# Patient Record
Sex: Male | Born: 1960 | Race: Black or African American | Hispanic: No | Marital: Married | Smoking: Never smoker
Health system: Southern US, Community
[De-identification: ages and names within clinical notes are randomized; demographics above are authoritative.]

## PROBLEM LIST (undated history)

## (undated) ENCOUNTER — Emergency Department (HOSPITAL_COMMUNITY): Payer: Self-pay | Source: Home / Self Care

## (undated) DIAGNOSIS — I10 Essential (primary) hypertension: Secondary | ICD-10-CM

## (undated) DIAGNOSIS — R519 Headache, unspecified: Secondary | ICD-10-CM

## (undated) DIAGNOSIS — R0600 Dyspnea, unspecified: Secondary | ICD-10-CM

## (undated) DIAGNOSIS — M1990 Unspecified osteoarthritis, unspecified site: Secondary | ICD-10-CM

## (undated) DIAGNOSIS — I82409 Acute embolism and thrombosis of unspecified deep veins of unspecified lower extremity: Secondary | ICD-10-CM

## (undated) DIAGNOSIS — S0990XA Unspecified injury of head, initial encounter: Secondary | ICD-10-CM

## (undated) DIAGNOSIS — I2699 Other pulmonary embolism without acute cor pulmonale: Secondary | ICD-10-CM

## (undated) DIAGNOSIS — T148XXA Other injury of unspecified body region, initial encounter: Secondary | ICD-10-CM

## (undated) DIAGNOSIS — M2550 Pain in unspecified joint: Secondary | ICD-10-CM

## (undated) DIAGNOSIS — M109 Gout, unspecified: Secondary | ICD-10-CM

## (undated) DIAGNOSIS — R569 Unspecified convulsions: Secondary | ICD-10-CM

## (undated) DIAGNOSIS — F209 Schizophrenia, unspecified: Secondary | ICD-10-CM

## (undated) DIAGNOSIS — F329 Major depressive disorder, single episode, unspecified: Secondary | ICD-10-CM

## (undated) DIAGNOSIS — E785 Hyperlipidemia, unspecified: Secondary | ICD-10-CM

## (undated) DIAGNOSIS — F419 Anxiety disorder, unspecified: Secondary | ICD-10-CM

## (undated) DIAGNOSIS — F319 Bipolar disorder, unspecified: Secondary | ICD-10-CM

## (undated) DIAGNOSIS — D649 Anemia, unspecified: Secondary | ICD-10-CM

## (undated) DIAGNOSIS — R413 Other amnesia: Secondary | ICD-10-CM

## (undated) DIAGNOSIS — R351 Nocturia: Secondary | ICD-10-CM

## (undated) DIAGNOSIS — G4700 Insomnia, unspecified: Secondary | ICD-10-CM

## (undated) DIAGNOSIS — I4891 Unspecified atrial fibrillation: Secondary | ICD-10-CM

## (undated) DIAGNOSIS — I499 Cardiac arrhythmia, unspecified: Secondary | ICD-10-CM

## (undated) DIAGNOSIS — Z8719 Personal history of other diseases of the digestive system: Secondary | ICD-10-CM

## (undated) DIAGNOSIS — Z8619 Personal history of other infectious and parasitic diseases: Secondary | ICD-10-CM

## (undated) DIAGNOSIS — M2540 Effusion, unspecified joint: Secondary | ICD-10-CM

## (undated) DIAGNOSIS — F32A Depression, unspecified: Secondary | ICD-10-CM

## (undated) DIAGNOSIS — R51 Headache: Secondary | ICD-10-CM

## (undated) HISTORY — PX: HERNIA REPAIR: SHX51

## (undated) HISTORY — DX: Acute embolism and thrombosis of unspecified deep veins of unspecified lower extremity: I82.409

## (undated) HISTORY — DX: Hyperlipidemia, unspecified: E78.5

## (undated) HISTORY — DX: Essential (primary) hypertension: I10

## (undated) HISTORY — DX: Unspecified atrial fibrillation: I48.91

---

## 2008-06-04 DIAGNOSIS — Z8619 Personal history of other infectious and parasitic diseases: Secondary | ICD-10-CM

## 2008-06-04 HISTORY — DX: Personal history of other infectious and parasitic diseases: Z86.19

## 2014-06-04 DIAGNOSIS — I82409 Acute embolism and thrombosis of unspecified deep veins of unspecified lower extremity: Secondary | ICD-10-CM

## 2014-06-04 DIAGNOSIS — I2699 Other pulmonary embolism without acute cor pulmonale: Secondary | ICD-10-CM

## 2014-06-04 HISTORY — PX: RIGHT HEART CATH: SHX6075

## 2014-06-04 HISTORY — DX: Acute embolism and thrombosis of unspecified deep veins of unspecified lower extremity: I82.409

## 2014-06-04 HISTORY — DX: Other pulmonary embolism without acute cor pulmonale: I26.99

## 2014-06-04 HISTORY — PX: CARDIAC CATHETERIZATION: SHX172

## 2015-11-17 ENCOUNTER — Ambulatory Visit: Payer: Self-pay | Admitting: Internal Medicine

## 2015-11-18 ENCOUNTER — Encounter: Payer: Self-pay | Admitting: Cardiovascular Disease

## 2015-11-18 ENCOUNTER — Ambulatory Visit (INDEPENDENT_AMBULATORY_CARE_PROVIDER_SITE_OTHER): Payer: Medicaid Other | Admitting: Cardiovascular Disease

## 2015-11-18 VITALS — BP 121/91 | HR 69 | Ht 72.0 in | Wt 257.4 lb

## 2015-11-18 DIAGNOSIS — E785 Hyperlipidemia, unspecified: Secondary | ICD-10-CM | POA: Insufficient documentation

## 2015-11-18 DIAGNOSIS — I482 Chronic atrial fibrillation, unspecified: Secondary | ICD-10-CM | POA: Insufficient documentation

## 2015-11-18 DIAGNOSIS — I10 Essential (primary) hypertension: Secondary | ICD-10-CM | POA: Diagnosis not present

## 2015-11-18 DIAGNOSIS — I2699 Other pulmonary embolism without acute cor pulmonale: Secondary | ICD-10-CM | POA: Diagnosis not present

## 2015-11-18 DIAGNOSIS — I82403 Acute embolism and thrombosis of unspecified deep veins of lower extremity, bilateral: Secondary | ICD-10-CM | POA: Insufficient documentation

## 2015-11-18 NOTE — Assessment & Plan Note (Signed)
History of hyperlipidemia on statin therapy followed by his PCP 

## 2015-11-18 NOTE — Progress Notes (Signed)
11/18/2015 Garrett Ferrell   05/18/1961  [id number]  Primary Physician Jearld Leschwight M Williams, MD Primary Cardiologist: Runell GessJonathan J Ailie Gage MD Roseanne RenoFACP, FACC, FAHA, FSCAI  HPI:  [name] is a 55 year old mildly overweight married African-American male father of 5 children and is accompanied by his wife Garrett Ferrell today. He was referred by Dr. Mayford KnifeWilliams for cardiovascular evaluation and to be established in our practice. He has a history of chronic A. Fib dating back to 1999 on oral anticoagulation. Other problems included hypertension and hyperlipidemia. He has never had a heart attack or stroke. He probably had bilateral DVTs and a pulmonary embolus in October of last yeae living in [place] and was switched to Eliquis anticoagulation.   Current Outpatient Prescriptions  Medication Sig Dispense Refill  . ALPRAZolam (XANAX) 0.5 MG tablet Take 0.5 mg by mouth every 8 (eight) hours as needed. for anxiety  0  . atorvastatin (LIPITOR) 10 MG tablet Take 10 mg by mouth daily.    [name]. ELIQUIS 5 MG TABS tablet Take 5 mg by mouth 2 (two) times daily.  3  . metoprolol (LOPRESSOR) 50 MG tablet Take 50 mg by mouth 2 (two) times daily.  6  . trazodone (DESYREL) 300 MG tablet Take 300 mg by mouth at bedtime.  5  . zolpidem (AMBIEN) 10 MG tablet Take 10 mg by mouth at bedtime as needed for sleep.     No current facility-administered medications for this visit.    Not on File  Social History   Social History  . Marital Status: Married    Spouse Name: N/A  . Number of Children: N/A  . Years of Education: N/A   Occupational History  . Not on file.   Social History Main Topics  . Smoking status: Never Smoker   . Smokeless tobacco: Not on file  . Alcohol Use: Not on file  . Drug Use: Not on file  . Sexual Activity: Not on file   Other Topics Concern  . Not on file   Social History Narrative  . No narrative on file     Review of Systems: General: negative for chills, fever, night sweats or  weight changes.  Cardiovascular: negative for chest pain, dyspnea on exertion, edema, orthopnea, palpitations, paroxysmal nocturnal dyspnea or shortness of breath Dermatological: negative for rash Respiratory: negative for cough or wheezing Urologic: negative for hematuria Abdominal: negative for nausea, vomiting, diarrhea, bright red blood per rectum, melena, or hematemesis Neurologic: negative for visual changes, syncope, or dizziness All other systems reviewed and are otherwise negative except as noted above.    Blood pressure 121/91, pulse 69, height 6' (1.829 m), weight 257 lb 6.4 oz (116.756 kg).  General appearance: alert and no distress Neck: no adenopathy, no carotid bruit, no JVD, supple, symmetrical, trachea midline and thyroid not enlarged, symmetric, no tenderness/mass/nodules Lungs: clear to auscultation bilaterally Heart: irregularly irregular rhythm Extremities: extremities normal, atraumatic, no cyanosis or edema  EKG atrial fibrillation with ventricular response of 69 and nonspecific ST and T-wave changes.I personally reviewed this EKG  ASSESSMENT AND PLAN:   Atrial fibrillation (HCC) History of chronic atrial fibrillation on Eliquis  oral regulation rate controlled. He has had A. Fib since 1999.  Essential hypertension History of hypertension blood pressure measured at 121/91. He is on metoprolol. 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 He was on Prdaxa prior to that and was switched to Eliquis  He had a pulmonary embolus as well.      Runell Gess MD FACP,FACC,FAHA, [facility] 11/18/2015 9:33 AM

## 2015-11-18 NOTE — Assessment & Plan Note (Signed)
History of bilateral D[place] back in [place] in [place]. He was on Prdaxa prior to that and was switched to Eliquis  He had a pulmonary embolus as well.

## 2015-11-18 NOTE — Assessment & Plan Note (Signed)
History of hypertension blood pressure measured at 121/91. He is on metoprolol. Continue current meds at current dosing

## 2015-11-18 NOTE — Assessment & Plan Note (Signed)
History of chronic atrial fibrillation on Eliquis  oral regulation rate controlled. He has had A. Fib since 1999.

## 2015-11-18 NOTE — Patient Instructions (Signed)
Medication Instructions:  Your physician recommends that you continue on your current medications as directed. Please refer to the Current Medication list given to you today.   Labwork: none  Testing/Procedures: none  Follow-Up: Your physician wants you to follow-up in: 12 month with Dr. Allyson SabalBerry. You will receive a reminder letter in the mail two months in advance. If you don't receive a letter, please call our office to schedule the follow-up appointment.   Any Other Special Instructions Will Be Listed Below (If Applicable).     If you need a refill on your cardiac medications before your next appointment, please call your pharmacy.

## 2015-12-22 ENCOUNTER — Ambulatory Visit: Payer: Self-pay | Admitting: Internal Medicine

## 2016-01-03 ENCOUNTER — Encounter: Payer: Self-pay | Admitting: Family Medicine

## 2016-01-03 ENCOUNTER — Ambulatory Visit (INDEPENDENT_AMBULATORY_CARE_PROVIDER_SITE_OTHER): Payer: Medicaid Other | Admitting: Family Medicine

## 2016-01-03 DIAGNOSIS — M25552 Pain in left hip: Secondary | ICD-10-CM

## 2016-01-03 DIAGNOSIS — M25551 Pain in right hip: Secondary | ICD-10-CM | POA: Insufficient documentation

## 2016-01-03 NOTE — Assessment & Plan Note (Signed)
Exam is suggestive of an arthritic source to his pain. Doesn't appear to be originating from his back. Doesn't appear to be related to any musculature in nature. - Prescribed mobic - We will obtain x-rays of pelvis and hips bilaterally. - The results will dictate the direction of care. Could consider injections versus referral to orthopedics.

## 2016-01-03 NOTE — Progress Notes (Signed)
  Garrett Ferrell - 56 y.o. male MRN [id number]  Date of birth: 12-22-1960  SUBJECTIVE:  Including CC & ROS.  Chief Complaint  Patient presents with  . Hip Pain    Garrett Ferrell is a 55 year old male that is presenting with bilateral hip pain. This is ongoing pain for more than the past two years. 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 The pain is achy in nature. The pain is in the anterior aspect of his hip and radiates distally down the anterior aspect bilaterally. Reports the pain is worse after driving or sitting for a prolonged period of time. He spends most of the day lying in bed to help reduce his pain. He is not currently taking any antibiotics. He denies ever receiving any hip injections. He denies any prior imaging of his pelvis or hips.  ROS: No unexpected weight loss, fever, chills, swelling, instability, muscle pain, numbness/tingling, redness, otherwise see HPI    HISTORY: Past Medical, Surgical, Social, and Family History Reviewed & Updated per EMR.   Pertinent Historical Findings include: PMSHx -  Chronic afib (eliquis), HTN, partial fusion of right ankle, history of fractured L4,  PSHx -  alcohol on occasion, no tobacco, Currently on disability from skull fracture and blindness in right eye.  FHx -  Mother and father with heart problems.  Medications - allopurinol, lipitor, eliquis, lopressor, risperdal, seroquel,   DATA REVIEWED: None to review   PHYSICAL EXAM:  VS: BP:110/67  HR:64bpm  TEMP: ( )  RESP:   HT:6' (182.9 cm)   WT:246 lb (111.6 kg)  BMI:33.4 PHYSICAL EXAM: Gen: NAD, alert, cooperative with exam, well-healed scars on his anterior for head HEENT: clear conjunctiva,  CV:  no edema, capillary refill brisk,  Resp: non-labored Skin: no rashes, normal turgor  Neuro: no gross deficits.  Psych:  alert and oriented Hip: ROM Limited internal range of motion left hip 10 and right hip with 10-20. Normal hip flexion. Normal external rotation. Standing hip rotation and gait  without trendelenburg sign / unsteadiness. Greater trochanter without tenderness to palpation. Pain on the lateral aspect reproduced with a FAIR test FADIR reproduced pain in the groin. FABER with limited range of motion and some pain. Neurovascularly intact   ASSESSMENT & PLAN:   Bilateral hip pain Exam is suggestive of an arthritic source to his pain. Doesn't appear to be originating from his back. Doesn't appear to be related to any musculature in nature. - Prescribed mobic - We will obtain x-rays of pelvis and hips bilaterally. - The results will dictate the direction of care. Could consider injections versus referral to orthopedics.

## 2016-01-04 ENCOUNTER — Ambulatory Visit
Admission: RE | Admit: 2016-01-04 | Discharge: 2016-01-04 | Disposition: A | Discharge status: Home / Self Care | Payer: Medicaid Other | Source: Ambulatory Visit | Attending: Sports Medicine | Admitting: Sports Medicine

## 2016-01-04 ENCOUNTER — Other Ambulatory Visit: Payer: Self-pay | Admitting: Family Medicine

## 2016-01-04 DIAGNOSIS — M25552 Pain in left hip: Principal | ICD-10-CM

## 2016-01-04 DIAGNOSIS — M25551 Pain in right hip: Secondary | ICD-10-CM

## 2016-01-05 ENCOUNTER — Telehealth: Payer: Self-pay | Admitting: *Deleted

## 2016-01-05 NOTE — Telephone Encounter (Signed)
Patient called asking for xray results. Please call him to better explain the xray findings and next step

## 2016-01-09 ENCOUNTER — Other Ambulatory Visit: Payer: Self-pay | Admitting: *Deleted

## 2016-01-09 ENCOUNTER — Telehealth: Payer: Self-pay | Admitting: Family Medicine

## 2016-01-09 MED ORDER — MELOXICAM 15 MG PO TABS: 15.0000 mg | ORAL_TABLET | Freq: Every day | ORAL | 1 refills | Status: DC

## 2016-01-09 NOTE — Telephone Encounter (Signed)
Spoke with patient about his x-ray results. He will make an appointment for Tuesday to have an injection.   Myra RudeJeremy E Matty Deamer, MD PGY-4, [facility] 01/09/2016, 2:41 PM

## 2016-01-18 ENCOUNTER — Ambulatory Visit (INDEPENDENT_AMBULATORY_CARE_PROVIDER_SITE_OTHER): Payer: Medicaid Other | Admitting: Family Medicine

## 2016-01-18 ENCOUNTER — Encounter: Payer: Self-pay | Admitting: Family Medicine

## 2016-01-18 VITALS — BP 143/92 | Ht 72.0 in | Wt 250.0 lb

## 2016-01-18 DIAGNOSIS — M25552 Pain in left hip: Secondary | ICD-10-CM | POA: Diagnosis not present

## 2016-01-18 DIAGNOSIS — M25551 Pain in right hip: Secondary | ICD-10-CM

## 2016-01-18 MED ORDER — METHYLPREDNISOLONE ACETATE 80 MG/ML IJ SUSP
80.0000 mg | Freq: Once | INTRAMUSCULAR | Status: AC
  Administered 2016-01-18: 80 mg via INTRAMUSCULAR

## 2016-01-20 ENCOUNTER — Other Ambulatory Visit: Payer: Self-pay | Admitting: Sports Medicine

## 2016-01-20 ENCOUNTER — Other Ambulatory Visit: Payer: Self-pay | Admitting: Family Medicine

## 2016-01-20 DIAGNOSIS — M25551 Pain in right hip: Secondary | ICD-10-CM

## 2016-01-20 DIAGNOSIS — M25552 Pain in left hip: Principal | ICD-10-CM

## 2016-01-20 NOTE — Assessment & Plan Note (Addendum)
Unable to perform ultrasound guided hip injections. - Referral placed to [facility] for fluoro guided hip injections. - IM Depo-Medrol provided today.

## 2016-01-20 NOTE — Progress Notes (Signed)
  Garrett Ferrell - 55 y.o. male MRN [id number]  Date of birth: 03/05/1961  Patient following up from visit on 01/03/16 for bilateral hip pain. We're going to perform bilateral intra-articular hip injections today. These were unable to be performed today as we are not able to achieve the desired depth to view his femoral neck.  ASSESSMENT & PLAN:   Bilateral hip pain Unable to perform ultrasound guided hip injections. - Referral [facility] for fluoro guided hip injections. - IM Depo-Medrol provided today.

## 2016-01-24 ENCOUNTER — Ambulatory Visit (INDEPENDENT_AMBULATORY_CARE_PROVIDER_SITE_OTHER): Payer: Medicaid Other | Admitting: Family Medicine

## 2016-01-24 ENCOUNTER — Encounter: Payer: Self-pay | Admitting: Family Medicine

## 2016-01-24 VITALS — BP 123/89 | HR 80 | Ht 72.0 in | Wt 250.0 lb

## 2016-01-24 DIAGNOSIS — M25571 Pain in right ankle and joints of right foot: Secondary | ICD-10-CM | POA: Insufficient documentation

## 2016-01-24 NOTE — Assessment & Plan Note (Signed)
Pain most likely related to arthritic pain status post surgery. Has not had any injury recently to suggest ankle sprain or fracture - Refer to orthopedics  - May benefit from cushioned orthotics in the future

## 2016-01-24 NOTE — Progress Notes (Signed)
  Garrett CodeJerome Ferrell - 55 y.o. male MRN [id number]  Date of birth: 05/08/1961  SUBJECTIVE:  Including CC & ROS.  Chief Complaint  Patient presents with  . Ankle Pain     [name] is a 55 year old male is presenting with right ankle pain. He is also been recently evaluated bilateral hip pain. He is a former Stage managerprofessional wrestler. He reports having an ankle fracture in 1999 and subsequent surgery for it. He has had pain since that time and his right ankle. The pain is occurring on the anterior aspect of the medial and lateral side. The pain occurs with walking. He has had a previous injection some years ago with no improvement. He has tried a brace with no improvement. He is not performed any physical therapy to this point. His initial injury was an inversion injury. He has had limited improvement with mobic.  ROS: No unexpected weight loss, fever, chills, swelling, redness, otherwise see HPI    HISTORY: Past Medical, Surgical, Social, and Family History Reviewed & Updated per EMR.   Pertinent Historical Findings include: PMSHx -  Chronic afib (eliquis), HTN, partial fusion of right ankle   DATA REVIEWED: None to review   PHYSICAL EXAM:  VS: BP:123/89  HR:80bpm  TEMP: ( )  RESP:   HT:6' (182.9 cm)   WT:250 lb (113.4 kg)  BMI:34 PHYSICAL EXAM: Gen: NAD, alert, cooperative with exam HEENT: clear conjunctiva, EOMI CV:  no edema, capillary refill brisk,  Resp: non-labored, normal speech Skin: no rashes, normal turgor  Neuro: no gross deficits.  Psych:  alert and oriented Ankle & Foot: Well-healed scars on the anterior aspect of his right ankle. No overlying erythema or ecchymosis. No edema. Limited range of motion in all directions. Tenderness to palpation over the medial lateral ankle joint Normal arch. No tenderness to palpation over the metatarsals. Normal pulses Normal sensation Normal gait Negative anterior drawer Negative compression test  ASSESSMENT & PLAN:   Right ankle  pain Pain most likely related to arthritic pain status post surgery. Has not had any injury recently to suggest ankle sprain or fracture - Refer to orthopedics  - May benefit from cushioned orthotics in the future

## 2016-02-13 ENCOUNTER — Telehealth: Payer: Self-pay | Admitting: Cardiovascular Disease

## 2016-02-13 NOTE — Telephone Encounter (Signed)
Victorino DikeJennifer is calling because mr. Merkel has a purple knot on the back of his knee. He has had a blood clot before and she is not sure whether he needs to go to ED or needs to come in to see the doctor

## 2016-02-13 NOTE — Telephone Encounter (Signed)
Have him see a MLP to eval

## 2016-02-13 NOTE — Telephone Encounter (Signed)
Spoke to wife (ok per DPR).  Reports last night they noticed a knot to back of left knee, red/purple in color, warm to touch, +painful.  Pt hx DVT and PE on eliquis (reports taking as prescribed).  Denies injury to area.  Concerned for DVT given hx and similar s/sx per wife.    Advised I would route to MD for advice/recommendations.  Verbalized understanding.

## 2016-02-14 NOTE — Telephone Encounter (Signed)
Spoke to wife-reports she will have pt call to schedule appt.

## 2016-02-14 NOTE — Telephone Encounter (Signed)
Called patient-appt made for 9/13 at 3:30 with Norma FredricksonLori Gerhardt NP at [facility] location-address and location given.  Pt verbalized understanding.

## 2016-02-15 ENCOUNTER — Ambulatory Visit: Payer: Self-pay | Admitting: Nurse Practitioner

## 2016-02-15 ENCOUNTER — Encounter: Payer: Self-pay | Admitting: Nurse Practitioner

## 2016-02-15 NOTE — Progress Notes (Deleted)
CARDIOLOGY OFFICE NOTE  Date:  02/15/2016    [name] Date of Birth: 24-Apr-1961 Medical Record #[id number]  PCP:  Jearld Lesch, MD  Cardiologist:  Cheron Schaumann chief complaint on file.   History of Present Illness: Garrett Ferrell is a 55 y.o. male who presents today for a work in visit. Seen for Dr. Allyson Sabal.   He has a history of chronic A. Fib dating back to 1999 on oral anticoagulation. Other problems included hypertension and hyperlipidemia. He has never had a heart attack or stroke. He probably had bilateral DVTs and a pulmonary embolus in October of 2016 and was switched to Eliquis anticoagulation.  Noted to be on multiple psyche medicines.   Phone call earlier this week - "Spoke to wife (ok per DPR).  Reports last night they noticed a knot to back of left knee, red/purple in color, warm to touch, +painful.  Pt hx DVT and PE on eliquis (reports taking as prescribed).  Denies injury to area.  Concerned for DVT given hx and similar s/sx per wife."    Thus added to my schedule.     Comes in today. Here with   Past Medical History:  Diagnosis Date  . Atrial fibrillation (HCC)   . Blindness of right eye   . DVT (deep venous thrombosis) (HCC)   . Hip pain   . History of ankle fusion    right  . Hyperlipidemia   . Hypertension     No past surgical history on file.   Medications: Current Outpatient Prescriptions  Medication Sig Dispense Refill  . allopurinol (ZYLOPRIM) 100 MG tablet Take 100 mg by mouth daily.  5  . ALPRAZolam (XANAX) 0.5 MG tablet Take 0.5 mg by mouth every 8 (eight) hours as needed. for anxiety  0  . atorvastatin (LIPITOR) 10 MG tablet Take 10 mg by mouth daily.    [name] ELIQUIS 5 MG TABS tablet Take 5 mg by mouth 2 (two) times daily.  3  . meloxicam (MOBIC) 15 MG tablet Take 1 tablet (15 mg total) by mouth daily. 30 tablet 1  . metoprolol (LOPRESSOR) 50 MG tablet Take 50 mg by mouth 2 (two) times daily.  6  . risperiDONE (RISPERDAL) 1 MG tablet  Take 1 mg by mouth 2 (two) times daily.  5  . SEROQUEL XR 150 MG 24 hr tablet Take 150 mg by mouth at bedtime.  1  . trazodone (DESYREL) 300 MG tablet Take 300 mg by mouth at bedtime.  5  . VIAGRA 100 MG tablet Take 100 mg by mouth daily as needed.  2  . zolpidem (AMBIEN) 10 MG tablet Take 10 mg by mouth at bedtime as needed for sleep.     No current facility-administered medications for this visit.     Allergies: No Known Allergies  Social History: The patient  reports that he has never smoked. He has never used smokeless tobacco. He reports that he drinks alcohol. He reports that he does not use drugs.   Family History: The patient's family history includes Heart Problems in his father and mother.   Review of Systems: Please see the history of present illness.   Otherwise, the review of systems is positive for none.   All other systems are reviewed and negative.   Physical Exam: VS:  There were no vitals taken for this visit. [name]  BMI There is no height or weight on file to calculate BMI.  Wt Readings from  Last 3 Encounters:  01/24/16 250 lb (113.4 kg)  01/18/16 250 lb (113.4 kg)  01/03/16 246 lb (111.6 kg)    General: Pleasant. Well developed, well nourished and in no acute distress.   HEENT: Normal.  Neck: Supple, no JVD, carotid bruits, or masses noted.  Cardiac: Regular rate and rhythm. No murmurs, rubs, or gallops. No edema.  Respiratory:  Lungs are clear to auscultation bilaterally with normal work of breathing.  GI: Soft and nontender.  MS: No deformity or atrophy. Gait and ROM intact.  Skin: Warm and dry. Color is normal.  Neuro:  Strength and sensation are intact and no gross focal deficits noted.  Psych: Alert, appropriate and with normal affect.   LABORATORY DATA:  EKG:  EKG is not ordered today.  No results found for: WBC, HGB, HCT, PLT, GLUCOSE, CHOL, TRIG, HDL, LDLDIRECT, LDLCALC, ALT, AST, NA, K, CL, CREATININE, BUN, CO2, TSH, PSA, INR, GLUF, HGBA1C,  MICROALBUR  BNP (last 3 results) No results for input(s): BNP in the last 8760 hours.  ProBNP (last 3 results) No results for input(s): PROBNP in the last 8760 hours.   Other Studies Reviewed Today:   Assessment/Plan:  Atrial fibrillation (HCC) History of chronic atrial fibrillation on Eliquis  oral regulation rate controlled. He has had A. Fib since 1999.  Essential hypertension History of hypertension blood pressure measured at 121/91. He is on metoprolol. Continue current meds at current dosing  Hyperlipidemia History of hyperlipidemia on statin therapy followed by his PCP  DVT, bilateral lower limbs (HCC) History of bilateral D[place] back in [place] of last [place] in [place]. He was on Prdaxa prior to that and was switched to Eliquis  He had a pulmonary embolus as well.  Current medicines are reviewed with the patient today.  The patient does not have concerns regarding medicines other than what has been noted above.  The following changes have been made:  See above.  Labs/ tests ordered today include:   No orders of the defined types were placed in this encounter.    Disposition:   FU with Dr. Allyson SabalBerry as planned.   Patient is agreeable to this plan and will call if any problems develop in the interim.   Signed: Rosalio MacadamiaLori C. Tonye Tancredi, RN, ANP-C 02/15/2016 1:39 PM  [facility] [address] [place], [place]  [address] Phone: [phone]) [phone] Fax: [phone]

## 2016-02-20 ENCOUNTER — Encounter: Payer: Self-pay | Admitting: Nurse Practitioner

## 2016-03-05 ENCOUNTER — Telehealth: Payer: Self-pay | Admitting: Family Medicine

## 2016-03-05 NOTE — Telephone Encounter (Signed)
Left VM for patient. If he calls back please have him speak with a nurse/CMA and ask what he would like to do for pain or what he has tried in the past.   If any questions then please take the best time and phone number to call and I will try to call him back.   Myra RudeJeremy E Schmitz, MD PGY-4, [facility] 03/05/2016, 1:55 PM

## 2016-03-28 ENCOUNTER — Encounter: Payer: Self-pay | Admitting: Physician Assistant

## 2016-03-28 ENCOUNTER — Ambulatory Visit (INDEPENDENT_AMBULATORY_CARE_PROVIDER_SITE_OTHER): Payer: Medicaid Other | Admitting: Physician Assistant

## 2016-03-28 VITALS — BP 138/84 | HR 86 | Ht 72.0 in | Wt 260.8 lb

## 2016-03-28 DIAGNOSIS — Z01818 Encounter for other preprocedural examination: Secondary | ICD-10-CM

## 2016-03-28 DIAGNOSIS — I482 Chronic atrial fibrillation: Secondary | ICD-10-CM

## 2016-03-28 DIAGNOSIS — I4821 Permanent atrial fibrillation: Secondary | ICD-10-CM

## 2016-03-28 DIAGNOSIS — I2692 Saddle embolus of pulmonary artery without acute cor pulmonale: Secondary | ICD-10-CM | POA: Diagnosis not present

## 2016-03-28 DIAGNOSIS — I82432 Acute embolism and thrombosis of left popliteal vein: Secondary | ICD-10-CM

## 2016-03-28 NOTE — Progress Notes (Signed)
Cardiology Office Note    Date:  03/28/2016   ID:  [name], DOB [birth year], MRN [id number]  PCP:  Jearld Lesch, MD  Cardiologist:  Dr. Allyson Sabal  Referred by Dr. Lajoyce Corners with [facility] Reason for referral: preop clearance prior to ankle surgery   Chief Complaint  Patient presents with  . Follow-up    seen for Dr. Allyson Sabal, preop clearance request by Dr. [facility], plan for shoulder surgery.     History of Present Illness:  Garrett Ferrell is a 55 y.o. 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 He was recently establish with [place] on 11/18/2015. He never had heart attack or stroke himself, however has significant family history of CAD with his father died of MI in his 22s. His mother has a pacemaker. He says he was on Pradaxa before, and transitioned to eliquis. He was on a long trip in 2016, when he forgot to take his eliquis for 3 days, he ended up in the hospital with DVT and PE. Based on outside records from [facility] in [facility] [place], he had a left popliteal DVT at the time, V/Q skin read as high likelihood of PE, CTA confirmed extensive bilateral pulmonary emboli. He eventually underwent right heart catheterization with EKOS therapy on 03/24/2015 for saddle emboli. Left coronary was not injected. Otherwise he has been doing well on current medication. His EKG does show T wave inversion in the anterior lead, however he denies chest pain at rest. He does not exert himself.   Otherwise he is doing okay, he denies any lower extremity edema, orthopnea or paroxysmal nocturnal dyspnea. His right ankle has been bothering him for several years after previous injury, he was recently seen by Dr. Lajoyce Corners who recommended surgery. Given the abnormal EKG and a significant family history, we decided to obtain outpatient Lexiscan stress test.  If low risk, he is cleared to proceed with surgery. I have personally checked with our clinical pharmacist regarding eliquis. Due to similarity of metabolism of eliquis versus Lovenox, no bridging was felt to be necessary. However given the fact that he had both chronic atrial fibrillation and PE/DVT, his risk of thromboembolic event is quite high, we would recommend to stop his eliquis for 24 hours prior to surgery (2 doses) if bleeding risk of surgery is relatively low. Per current guidelines, eliquis should be held for at least 48 hours before any major surgery that cause significant bleeding, however may hold for 24 hours in people without significant chronic bleeding problem.   Past Medical History:  Diagnosis Date  . Atrial fibrillation (HCC)   . Blindness of right eye   . DVT (deep venous thrombosis) (HCC)   . Hip pain   . History of ankle fusion    right  . Hyperlipidemia   . Hypertension     Past Surgical History:  Procedure Laterality Date  . RIGHT HEART CATH  2016   at [facility] during his DVT/PE    Current Medications: Outpatient Medications Prior to Visit  Medication Sig Dispense Refill  . allopurinol (ZYLOPRIM) 100 MG tablet Take 100 mg by mouth daily.  5  . ALPRAZolam (XANAX) 0.5 MG tablet Take 0.5 mg by mouth every 8 (eight) hours as needed. for anxiety  0  . atorvastatin (LIPITOR) 10 MG tablet Take 10 mg by mouth daily.    [name] ELIQUIS  5 MG TABS tablet Take 5 mg by mouth 2 (two) times daily.  3  . meloxicam (MOBIC) 15 MG tablet Take 1 tablet (15 mg total) by mouth daily. 30 tablet 1  . metoprolol (LOPRESSOR) 50 MG tablet Take 50 mg by mouth 2 (two) times daily.  6  . risperiDONE (RISPERDAL) 1 MG tablet Take 1 mg by mouth 2 (two) times daily.  5  . SEROQUEL XR 150 MG 24 hr tablet Take 150 mg by mouth at bedtime.  1  . trazodone (DESYREL) 300 MG tablet Take 300 mg by mouth at bedtime.  5  . VIAGRA 100 MG tablet Take 100 mg by mouth daily as  needed.  2  . zolpidem (AMBIEN) 10 MG tablet Take 10 mg by mouth at bedtime as needed for sleep.     No facility-administered medications prior to visit.      Allergies:   Review of patient's allergies indicates no known allergies.   Social History   Social History  . Marital status: Married    Spouse name: Victorino DikeJennifer  . Number of children: 5  . Years of education: N/A   Occupational History  . disabled    Social History Main Topics  . Smoking status: Never Smoker  . Smokeless tobacco: Never Used  . Alcohol use 0.0 oz/week     Comment: rarely  . Drug use: No  . Sexual activity: Not Asked   Other Topics Concern  . None   Social History Narrative  . None     Family History:  The patient's family history includes Heart Problems in his mother; Heart attack in his father.   ROS:   Please see the history of present illness.    ROS All other systems reviewed and are negative.   PHYSICAL EXAM:   VS:  BP 138/84   Pulse 86   Ht 6' (1.829 m)   Wt 260 lb 12.8 oz (118.3 kg)   BMI 35.37 kg/m    GEN: Well nourished, well developed, in no acute distress  HEENT: normal  Neck: no JVD, carotid bruits, or masses Cardiac: irregularly irregular; no murmurs, rubs, or gallops,no edema  Respiratory:  clear to auscultation bilaterally, normal work of breathing GI: soft, nontender, nondistended, + BS MS: no deformity or atrophy  Skin: warm and dry, no rash Neuro:  Alert and Oriented x 3, Strength and sensation are intact Psych: euthymic mood, full affect  Wt Readings from Last 3 Encounters:  03/28/16 260 lb 12.8 oz (118.3 kg)  01/24/16 250 lb (113.4 kg)  01/18/16 250 lb (113.4 kg)      Studies/Labs Reviewed:   EKG:  EKG is ordered today.  The ekg ordered today demonstrates Normal sinus rhythm with T-wave inversion interiorly leads, occasional PVCs  Recent Labs: No results found for requested labs within last 8760 hours.   Lipid Panel No results found for: CHOL, TRIG, HDL,  CHOLHDL, VLDL, LDLCALC, LDLDIRECT  Additional studies/ records that were reviewed today include:   Outside report from [facility] in [place], previous right heart cath report.     ASSESSMENT:    1. Pre-op examination   2. Permanent atrial fibrillation (HCC)   3. Deep vein thrombosis (DVT) of popliteal vein of left lower extremity, unspecified chronicity (HCC)   4. 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 Preoperative clearance  - He has upcoming right ankle surgery.  He had a stress test several years ago, he has not been exerting himself, recent EKG does show that he has T-wave inversion in the anterior leads.  - We will obtain outpatient Lexiscan stress test prior to clearance.  - I have discussed up with our clinical pharmacist regarding the duration of eliquis, since the half life of eliquis is very similar to Lovenox, no bridging was felt to be necessary. For low risk bleeding surgery, we recommend hold the eliquis for 24 hours prior to the surgery. For high risk bleeding surgery, we recommend hold eliquis for 48 hours. In this patient will has upcoming ankle surgery, who had permanent atrial fibrillation and history of saddle emboli, we would recommend hold eliquis for 24 hours prior to surgery unless surgeon felt otherwise. He should be restarted on eliquis as soon as possible after surgery as his risk of developing DVT is quite high.  2. Permanent atrial fibrillation on eliquis  - First diagnosed in 1999, permanent by this point. He has been compliant with eliquis. EKG continued to show atrial fibrillation, however rate controlled with heart rate of 86.  - This patients CHA2DS2-VASc Score and unadjusted Ischemic Stroke Rate (% per year) is equal to 0.6 % stroke rate/year from a score of 1  Above score calculated as 1 point each if present [CHF, HTN, DM, Vascular=MI/PAD/Aortic Plaque, Age if 65-74,  or Male] Above score calculated as 2 points each if present [Age > 75, or Stroke/TIA/TE   3. H/o DVT/PE: had RHC with EKOS for saddle emboli in October 2016. He never had DVT and PE prior to that point, he attributed to the cause as coming off of eliquis for 3 days and had a prolonged flight where he was sedentary.  4. HTN: Blood pressure is 138/84. He is on metoprolol 50 mg twice a day for rate control. Last blood pressure in the office in August 2017 was within normal limits.    Medication Adjustments/Labs and Tests Ordered: Current medicines are reviewed at length with the patient today.  Concerns regarding medicines are outlined above.  Medication changes, Labs and Tests ordered today are listed in the Patient Instructions below. Patient Instructions  Medication Instructions:  Continue current medications  Labwork: None Ordered  Testing/Procedures: Your physician has requested that you have a lexiscan myoview. For further information please visit [email or web address] Please follow instruction sheet, as given.   Follow-Up: Your physician recommends that you schedule a follow-up appointment in: 4-6 weeks with Lisabeth Devoid or Dr Allyson Sabal   Any Other Special Instructions Will Be Listed Below (If Applicable).   If you need a refill on your cardiac medications before your next appointment, please call your pharmacy.      Ramond Dial, [place]  03/28/2016 3:45 PM    [facility] [address] [place], [place], [place]  [address] Phone: [phone]; Fax: [phone]

## 2016-03-28 NOTE — Patient Instructions (Signed)
Medication Instructions:  Continue current medications  Labwork: None Ordered  Testing/Procedures: Your physician has requested that you have a lexiscan myoview. For further information please visit [email or web address] Please follow instruction sheet, as given.   Follow-Up: Your physician recommends that you schedule a follow-up appointment in: 4-6 weeks with Lisabeth DevoidMeng or Dr Allyson SabalBerry   Any Other Special Instructions Will Be Listed Below (If Applicable).   If you need a refill on your cardiac medications before your next appointment, please call your pharmacy.

## 2016-04-03 ENCOUNTER — Telehealth (HOSPITAL_COMMUNITY): Payer: Self-pay

## 2016-04-03 NOTE — Telephone Encounter (Signed)
Encounter complete. 

## 2016-04-05 ENCOUNTER — Ambulatory Visit (HOSPITAL_COMMUNITY)
Admission: RE | Admit: 2016-04-05 | Discharge: 2016-04-05 | Disposition: A | Discharge status: Home / Self Care | Payer: Medicaid Other | Source: Ambulatory Visit | Attending: Cardiology | Admitting: Cardiology

## 2016-04-05 DIAGNOSIS — Z01818 Encounter for other preprocedural examination: Secondary | ICD-10-CM

## 2016-04-05 DIAGNOSIS — I4891 Unspecified atrial fibrillation: Secondary | ICD-10-CM | POA: Diagnosis not present

## 2016-04-05 LAB — MYOCARDIAL PERFUSION IMAGING
CHL CUP RESTING HR STRESS: 69 {beats}/min
CSEPPHR: 75 {beats}/min
LV sys vol: 1 mL
SDS: 1
SRS: 3
SSS: 4
TID: 1.16

## 2016-04-05 MED ORDER — TECHNETIUM TC 99M TETROFOSMIN IV KIT
29.4000 | PACK | Freq: Once | INTRAVENOUS | Status: AC | PRN
  Administered 2016-04-05: 29.4 via INTRAVENOUS
  Filled 2016-04-05: qty 30

## 2016-04-05 MED ORDER — REGADENOSON 0.4 MG/5ML IV SOLN
0.4000 mg | Freq: Once | INTRAVENOUS | Status: AC
  Administered 2016-04-05: 0.4 mg via INTRAVENOUS

## 2016-04-05 MED ORDER — TECHNETIUM TC 99M TETROFOSMIN IV KIT
10.2000 | PACK | Freq: Once | INTRAVENOUS | Status: AC | PRN
  Administered 2016-04-05: 10.2 via INTRAVENOUS
  Filled 2016-04-05: qty 11

## 2016-04-06 ENCOUNTER — Encounter: Payer: Self-pay | Admitting: Physician Assistant

## 2016-04-09 ENCOUNTER — Ambulatory Visit (INDEPENDENT_AMBULATORY_CARE_PROVIDER_SITE_OTHER): Payer: Medicaid Other

## 2016-04-09 ENCOUNTER — Telehealth: Payer: Self-pay | Admitting: Cardiovascular Disease

## 2016-04-09 ENCOUNTER — Ambulatory Visit (INDEPENDENT_AMBULATORY_CARE_PROVIDER_SITE_OTHER): Payer: Medicaid Other | Admitting: Orthopedic Surgery

## 2016-04-09 ENCOUNTER — Encounter (INDEPENDENT_AMBULATORY_CARE_PROVIDER_SITE_OTHER): Payer: Self-pay | Admitting: Orthopedic Surgery

## 2016-04-09 DIAGNOSIS — M19072 Primary osteoarthritis, left ankle and foot: Secondary | ICD-10-CM

## 2016-04-09 DIAGNOSIS — M19071 Primary osteoarthritis, right ankle and foot: Secondary | ICD-10-CM | POA: Diagnosis not present

## 2016-04-09 DIAGNOSIS — M25571 Pain in right ankle and joints of right foot: Secondary | ICD-10-CM

## 2016-04-09 NOTE — Telephone Encounter (Signed)
New message     Patient wife calling for nuclear test results.

## 2016-04-09 NOTE — Progress Notes (Signed)
Office Visit Note   Patient: Garrett Ferrell           Date of Birth: 06/12/1960           MRN: [id number] Visit Date: 04/09/2016              Requested by: Jearld Leschwight M Williams, MD [address] [place], [place] [address] PCP: Jearld Leschwight M Williams, MD   Assessment & Plan: Visit Diagnoses:  1. Osteoarthritis of both subtalar joints   2. Pain in right ankle and joints of right foot   Patient has just received cardiac clearance and we will schedule him for surgery at this time.  Plan: Plan to follow-up in the office 2 weeks postoperatively. Plan for posterior arthroscopic subtalar arthrodesis. After this has fused will evaluate the ankle joint. Patient may be able to continue with activities with the current arthritis in his ankle or may require total ankle arthroplasty.  Follow-Up Instructions: No Follow-up on file.   Orders:  Orders Placed This Encounter  Procedures  . XR Ankle Complete Right   No orders of the defined types were placed in this encounter.     Procedures: No procedures performed   Clinical Data: No additional findings.   Subjective: Chief Complaint  Patient presents with  . Right Ankle - Pain, Injury    Patient presents right ankle injury. He has fallen off porch approximately one week ago, he is concerned about possible re-injury. He is not currently taking any pain medication. He was also pending surgical intervention severe traumatic arthritis right ankle and subtalar joint. He obtained cardiac clearance this past Friday for surgery. He states his pain is progressively worsening. He is unable to walk due to pain at times.    Review of Systems   Objective: Vital Signs: There were no vitals taken for this visit.  Physical Exam  Ortho Exam on examination patient is alert oriented no adenopathy well-dressed normal affect normal strength he does have an antalgic gait he has pain with any attempted range of motion of the ankle and subtalar joint on the  right.  Specialty Comments:  No specialty comments available.  Imaging: Xr Ankle Complete Right  Result Date: 04/09/2016 Repeat radiographs of the right hindfoot shows traumatic arthritis of the ankle and subtalar joint beginning features no acute fractures.    PMFS History: Patient Active Problem List   Diagnosis Date Noted  . Right ankle pain 01/24/2016  . Bilateral hip pain 01/03/2016  . Atrial fibrillation (HCC) 11/18/2015  . Essential hypertension 11/18/2015  . Hyperlipidemia 11/18/2015  . DVT, bilateral lower limbs (HCC) 11/18/2015  . Pulmonary embolus (HCC) 11/18/2015   Past Medical History:  Diagnosis Date  . Atrial fibrillation (HCC)   . Blindness of right eye   . DVT (deep venous thrombosis) (HCC)   . Hip pain   . History of ankle fusion    right  . Hyperlipidemia   . Hypertension     Family History  Problem Relation Age of Onset  . Heart Problems Mother   . Heart attack Father     age 3340s    Past Surgical History:  Procedure Laterality Date  . RIGHT HEART CATH  2016   [facility] his D[facility]/PE   Social History   Occupational History  . disabled    Social History Main Topics  . Smoking status: Never Smoker  . Smokeless tobacco: Never Used  . Alcohol use 0.0 oz/week  Comment: rarely  . Drug use: No  . Sexual activity: Not on file

## 2016-04-09 NOTE — Progress Notes (Deleted)
   Office Visit Note   Patient: Garrett Ferrell           Date of Birth: 01/11/1961           MRN: [id number] Visit Date: 04/09/2016              Requested by: Jearld Leschwight M Williams, MD [address] [place], [place] [address] PCP: Jearld Leschwight M Williams, MD   Assessment & Plan: Visit Diagnoses: No diagnosis found.  Plan: ***  Follow-Up Instructions: No Follow-up on file.   Orders:  No orders of the defined types were placed in this encounter.  No orders of the defined types were placed in this encounter.     Procedures: No procedures performed   Clinical Data: No additional findings.   Subjective: Chief Complaint  Patient presents with  . Left Leg - Follow-up    Left below the knee amputation  . Right Leg - Follow-up    Right below the knee amputation    HPI  Review of Systems   Objective: Vital Signs: There were no vitals taken for this visit.  Physical Exam  Ortho Exam  Specialty Comments:  No specialty comments available.  Imaging: No results found.   PMFS History: Patient Active Problem List   Diagnosis Date Noted  . Right ankle pain 01/24/2016  . Bilateral hip pain 01/03/2016  . Atrial fibrillation (HCC) 11/18/2015  . Essential hypertension 11/18/2015  . Hyperlipidemia 11/18/2015  . DVT, bilateral lower limbs (HCC) 11/18/2015  . Pulmonary embolus (HCC) 11/18/2015   Past Medical History:  Diagnosis Date  . Atrial fibrillation (HCC)   . Blindness of right eye   . DVT (deep venous thrombosis) (HCC)   . Hip pain   . History of ankle fusion    right  . Hyperlipidemia   . Hypertension     Family History  Problem Relation Age of Onset  . Heart Problems Mother   . Heart attack Father     age 6740s    Past Surgical History:  Procedure Laterality Date  . RIGHT HEART CATH  2016   [facility] his [facility]   Social History   Occupational History  . disabled    Social History Main Topics  . Smoking status:  Never Smoker  . Smokeless tobacco: Never Used  . Alcohol use 0.0 oz/week     Comment: rarely  . Drug use: No  . Sexual activity: Not on file

## 2016-04-09 NOTE — Telephone Encounter (Signed)
Spoke w wife regarding results. Results given, as previously discussed with patient. She notes pt has hx of TBI and often has difficulty recalling information given. She is DPR and recommendations were discussed in detail, including med hold instruction for upcoming procedure. He also has a planned dental procedure which involves a filling and cleaning. She states dentist office needs clearance but will get in contact with us. Informed her I can give guidance to dentist office concerning our patient protocols. She is aware if further questions or concerns, to call.

## 2016-04-11 ENCOUNTER — Telehealth: Payer: Self-pay | Admitting: Cardiovascular Disease

## 2016-04-11 NOTE — Telephone Encounter (Signed)
Recommendations faxed to provider.

## 2016-04-11 NOTE — Telephone Encounter (Signed)
New message      1. What dental office are you calling from? [facility] 2. What is your office phone and fax number?  Fax [phone]--need something in writing for patient's chart  What type of procedure is the patient having performed? Deep cleaning What date is procedure scheduled? Pending clearance What is your question (ex. Antibiotics prior to procedure, holding medication-we need to know how long dentist wants pt to hold med)? Hold eliquis prior to deep cleaning?

## 2016-04-11 NOTE — Telephone Encounter (Signed)
No need for SBE prophylaxis. No need to stop Eliquis unless dentis requires this. Given his pulmonary emboli in the past. He would need Lovenox bridging if oral anticoagulation was discontinued.

## 2016-04-11 NOTE — Telephone Encounter (Signed)
Based on cardiac history, should not require endocarditis prophylaxis. Deep cleaning should not require Eliquis interruption. Dentist's office requesting something in writing. Routed to Dr. Allyson SabalBerry for note.

## 2016-04-17 ENCOUNTER — Other Ambulatory Visit (INDEPENDENT_AMBULATORY_CARE_PROVIDER_SITE_OTHER): Payer: Self-pay | Admitting: Family

## 2016-04-17 ENCOUNTER — Telehealth: Payer: Self-pay | Admitting: Cardiovascular Disease

## 2016-04-17 NOTE — Telephone Encounter (Signed)
Wife is calling asking if you would refax what you faxed on 04-11-16 please.

## 2016-04-17 NOTE — Telephone Encounter (Signed)
Fax resent and wife aware. She also had questions regarding patient's upcoming ortho procedure. I addressed these based on Hao's guidance (see clinic visit note for clearance and med hold instructions). She voiced understanding and thanks. No further needs at this time.

## 2016-04-18 ENCOUNTER — Encounter (HOSPITAL_COMMUNITY): Payer: Self-pay

## 2016-04-18 ENCOUNTER — Encounter (HOSPITAL_COMMUNITY)
Admission: RE | Admit: 2016-04-18 | Discharge: 2016-04-18 | Disposition: A | Discharge status: Home / Self Care | Payer: Medicaid Other | Source: Ambulatory Visit | Attending: Orthopedic Surgery | Admitting: Orthopedic Surgery

## 2016-04-18 DIAGNOSIS — Z01812 Encounter for preprocedural laboratory examination: Secondary | ICD-10-CM | POA: Insufficient documentation

## 2016-04-18 DIAGNOSIS — M12571 Traumatic arthropathy, right ankle and foot: Secondary | ICD-10-CM | POA: Insufficient documentation

## 2016-04-18 HISTORY — DX: Headache: R51

## 2016-04-18 HISTORY — DX: Nocturia: R35.1

## 2016-04-18 HISTORY — DX: Insomnia, unspecified: G47.00

## 2016-04-18 HISTORY — DX: Unspecified convulsions: R56.9

## 2016-04-18 HISTORY — DX: Unspecified osteoarthritis, unspecified site: M19.90

## 2016-04-18 HISTORY — DX: Major depressive disorder, single episode, unspecified: F32.9

## 2016-04-18 HISTORY — DX: Depression, unspecified: F32.A

## 2016-04-18 HISTORY — DX: Personal history of other infectious and parasitic diseases: Z86.19

## 2016-04-18 HISTORY — DX: Pain in unspecified joint: M25.50

## 2016-04-18 HISTORY — DX: Gout, unspecified: M10.9

## 2016-04-18 HISTORY — DX: Effusion, unspecified joint: M25.40

## 2016-04-18 HISTORY — DX: Anxiety disorder, unspecified: F41.9

## 2016-04-18 HISTORY — DX: Other injury of unspecified body region, initial encounter: T14.8XXA

## 2016-04-18 HISTORY — DX: Headache, unspecified: R51.9

## 2016-04-18 LAB — BASIC METABOLIC PANEL
Anion gap: 8 (ref 5–15)
BUN: 9 mg/dL (ref 6–20)
CHLORIDE: 106 mmol/L (ref 101–111)
CO2: 26 mmol/L (ref 22–32)
CREATININE: 1.23 mg/dL (ref 0.61–1.24)
Calcium: 8.9 mg/dL (ref 8.9–10.3)
GFR calc Af Amer: >60 mL/min (ref >60)
GFR calc non Af Amer: >60 mL/min (ref >60)
GLUCOSE: 111 mg/dL — AB (ref 65–99)
POTASSIUM: 4.1 mmol/L (ref 3.5–5.1)
Sodium: 140 mmol/L (ref 135–145)

## 2016-04-18 LAB — SURGICAL PCR SCREEN
MRSA, PCR: NEGATIVE
Staphylococcus aureus: NEGATIVE

## 2016-04-18 LAB — CBC
HEMATOCRIT: 36.6 % — AB (ref 39.0–52.0)
Hemoglobin: 12 g/dL — ABNORMAL LOW (ref 13.0–17.0)
MCH: 23.1 pg — AB (ref 26.0–34.0)
MCHC: 32.8 g/dL (ref 30.0–36.0)
MCV: 70.5 fL — AB (ref 78.0–100.0)
PLATELETS: 167 10*3/uL (ref 150–400)
RBC: 5.19 MIL/uL (ref 4.22–5.81)
RDW: 15.2 % (ref 11.5–15.5)
WBC: 4.3 10*3/uL (ref 4.0–10.5)

## 2016-04-18 LAB — NO BLOOD PRODUCTS

## 2016-04-18 MED ORDER — CHLORHEXIDINE GLUCONATE 4 % EX LIQD: 60.0000 mL | Freq: Once | CUTANEOUS | Status: DC

## 2016-04-18 NOTE — Progress Notes (Signed)
Cardiologist is Dr.J Allyson SabalBerry with last visit in epic from 11/2015.CLearance note in epic from 04/06/16  Stress test in epic from 04-05-16  Heart cath report in epic from 03/2016  Echo many yrs ago   Medical Md is with [facility]  EKG in epic from 03-28-16  CXR denies

## 2016-04-18 NOTE — Progress Notes (Signed)
   04/18/16 1439  OBSTRUCTIVE SLEEP APNEA  Have you ever been diagnosed with sleep apnea through a sleep study? No  Do you snore loudly (loud enough to be heard through closed doors)?  0  Do you often feel tired, fatigued, or sleepy during the daytime (such as falling asleep during driving or talking to someone)? 0  Has anyone observed you stop breathing during your sleep? 0  Do you have, or are you being treated for high blood pressure? 1  BMI more than 35 kg/m2? 1  Age > 50 (1-yes) 1  Neck circumference greater than:Male 16 inches or larger, Male 17inches or larger? 1 54(19)  Male Gender (Yes=1) 1  Obstructive Sleep Apnea Score 5  Score 5 or greater  Results sent to PCP

## 2016-04-18 NOTE — Pre-Procedure Instructions (Signed)
Garrett CodeJerome Ferrell  04/18/2016      [id number] [place]- [place],  - [id number] [facility] [address]  [place] [address] Phone: [phone] Fax: [phone]    Your procedure is scheduled on Fri, Nov 17 @ 2:05 PM  [facility] at 12:00 PM  Call this number if you have problems the morning of surgery:  [phone]   Remember:  Do not eat food or drink liquids after midnight.  Take these medicines the morning of surgery with A SIP OF WATER Allopurinol(Zyloprim),Xanax(Alprazolam-if needed),and Metoprolol(Lopressor)             Hold Eliquis x 24 hrs. Stop taking your Meloxicam. No Goody's,BC's,Aleve,Advil,Motrin,Ibuprofen,Fish Oil,or any Herbal Medications.    Do not wear jewelry.  Do not wear lotions, powders,colognes, or deoderant.             Men may shave face and neck.  Do not bring valuables to the hospital.  [facility] is not responsible for any belongings or valuables.  Contacts, dentures or bridgework may not be worn into surgery.  Leave your suitcase in the car.  After surgery it may be brought to your room.  For patients admitted to the hospital, discharge time will be determined by your treatment team.  Patients discharged the day of surgery will not be allowed to drive home.    [facility]  Before surgery, you can play an important role.  Because skin is not sterile, your skin needs to be as free of germs as possible.  You can reduce the number of germs on you skin by washing with CHG (chlorahexidine gluconate) soap before surgery.  CHG is an antiseptic cleaner which kills germs and bonds with the skin to continue killing germs even after washing.  Please DO NOT use if you have an allergy to CHG or antibacterial soaps.  If your skin becomes reddened/irritated stop using the CHG and inform your nurse when you arrive at Short Stay.  Do not shave  (including legs and underarms) for at least 48 hours prior to the first CHG shower.  You may shave your face.  Please follow these instructions carefully:   1.  Shower with CHG Soap the night before surgery and the                                morning of Surgery.  2.  If you choose to wash your hair, wash your hair first as usual with your       normal shampoo.  3.  After you shampoo, rinse your hair and body thoroughly to remove the                      Shampoo.  4.  Use CHG as you would any other liquid soap.  You can apply chg directly       to the skin and wash gently with scrungie or a clean washcloth.  5.  Apply the CHG Soap to your body ONLY FROM THE NECK DOWN.        Do not use on open wounds or open sores.  Avoid contact with your eyes,       ears, mouth and genitals (private parts).  Wash genitals (private parts)       with your normal soap.  6.  Wash thoroughly, paying special attention to the area where your surgery        will be performed.  7.  Thoroughly rinse your body with warm water from the neck down.  8.  DO NOT shower/wash with your normal soap after using and rinsing off       the CHG Soap.  9.  Pat yourself dry with a clean towel.            10.  Wear clean pajamas.            11.  Place clean sheets on your bed the night of your first shower and do not        sleep with pets.  Day of Surgery  Do not apply any lotions/deoderants the morning of surgery.  Please wear clean clothes to the hospital/surgery center.    Please read over the following fact sheets that you were given. Pain Booklet, Coughing and Deep Breathing, MRSA Information and Surgical Site Infection Prevention

## 2016-04-19 ENCOUNTER — Telehealth (INDEPENDENT_AMBULATORY_CARE_PROVIDER_SITE_OTHER): Payer: Self-pay | Admitting: Orthopedic Surgery

## 2016-04-19 ENCOUNTER — Other Ambulatory Visit (INDEPENDENT_AMBULATORY_CARE_PROVIDER_SITE_OTHER): Payer: Self-pay

## 2016-04-19 MED ORDER — CEFAZOLIN SODIUM 10 G IJ SOLR
3.0000 g | INTRAMUSCULAR | Status: AC
  Administered 2016-04-20: 3 g via INTRAVENOUS
  Filled 2016-04-19: qty 3000

## 2016-04-19 NOTE — Telephone Encounter (Signed)
[name] stopped by the office, filled out and signed a medical records release form. He needs a letter sent to his Psychologist, Trinna Postlex, stating he's having surgery tomorrow and he'll be unavailable for their sessions for four to six weeks. Please give him a phone call once the letter has been faxed.  NOTE: When you fax the information please put, "Attn: Alex" on the cover sheet.  [facility] Ph# [phone]  / Fax# [phone]  Thank you.

## 2016-04-19 NOTE — Telephone Encounter (Signed)
I called pt to advise that letter has been faxed as he has requested.

## 2016-04-20 ENCOUNTER — Ambulatory Visit (HOSPITAL_COMMUNITY): Payer: Medicaid Other | Admitting: Anesthesiology

## 2016-04-20 ENCOUNTER — Encounter (HOSPITAL_COMMUNITY): Payer: Self-pay | Admitting: Anesthesiology

## 2016-04-20 ENCOUNTER — Ambulatory Visit (HOSPITAL_COMMUNITY)
Admission: RE | Admit: 2016-04-20 | Discharge: 2016-04-20 | Disposition: A | Discharge status: Home / Self Care | Payer: Medicaid Other | Source: Ambulatory Visit | Attending: Orthopedic Surgery | Admitting: Orthopedic Surgery

## 2016-04-20 ENCOUNTER — Encounter (HOSPITAL_COMMUNITY)
Admission: RE | Disposition: A | Discharge status: Home / Self Care | Payer: Self-pay | Source: Ambulatory Visit | Attending: Orthopedic Surgery

## 2016-04-20 DIAGNOSIS — F329 Major depressive disorder, single episode, unspecified: Secondary | ICD-10-CM | POA: Insufficient documentation

## 2016-04-20 DIAGNOSIS — Z9889 Other specified postprocedural states: Secondary | ICD-10-CM | POA: Insufficient documentation

## 2016-04-20 DIAGNOSIS — M19071 Primary osteoarthritis, right ankle and foot: Secondary | ICD-10-CM | POA: Diagnosis not present

## 2016-04-20 DIAGNOSIS — I10 Essential (primary) hypertension: Secondary | ICD-10-CM | POA: Insufficient documentation

## 2016-04-20 DIAGNOSIS — E785 Hyperlipidemia, unspecified: Secondary | ICD-10-CM | POA: Insufficient documentation

## 2016-04-20 DIAGNOSIS — Z8249 Family history of ischemic heart disease and other diseases of the circulatory system: Secondary | ICD-10-CM | POA: Insufficient documentation

## 2016-04-20 DIAGNOSIS — I4891 Unspecified atrial fibrillation: Secondary | ICD-10-CM | POA: Diagnosis not present

## 2016-04-20 DIAGNOSIS — F419 Anxiety disorder, unspecified: Secondary | ICD-10-CM | POA: Diagnosis not present

## 2016-04-20 DIAGNOSIS — M109 Gout, unspecified: Secondary | ICD-10-CM | POA: Diagnosis not present

## 2016-04-20 DIAGNOSIS — Z7901 Long term (current) use of anticoagulants: Secondary | ICD-10-CM | POA: Diagnosis not present

## 2016-04-20 DIAGNOSIS — Z79899 Other long term (current) drug therapy: Secondary | ICD-10-CM | POA: Diagnosis not present

## 2016-04-20 DIAGNOSIS — G4700 Insomnia, unspecified: Secondary | ICD-10-CM | POA: Insufficient documentation

## 2016-04-20 DIAGNOSIS — Z8619 Personal history of other infectious and parasitic diseases: Secondary | ICD-10-CM | POA: Insufficient documentation

## 2016-04-20 DIAGNOSIS — M19171 Post-traumatic osteoarthritis, right ankle and foot: Secondary | ICD-10-CM | POA: Insufficient documentation

## 2016-04-20 HISTORY — PX: ANKLE FUSION: SHX881

## 2016-04-20 HISTORY — PX: ANKLE ARTHROSCOPY: SHX545

## 2016-04-20 SURGERY — ARTHRODESIS ANKLE
Anesthesia: General | Site: Ankle | Laterality: Right

## 2016-04-20 MED ORDER — OXYCODONE-ACETAMINOPHEN 5-325 MG PO TABS
ORAL_TABLET | ORAL | Status: AC
  Administered 2016-04-20: 1
  Filled 2016-04-20: qty 1

## 2016-04-20 MED ORDER — FENTANYL CITRATE (PF) 100 MCG/2ML IJ SOLN
INTRAMUSCULAR | Status: AC
  Filled 2016-04-20: qty 2

## 2016-04-20 MED ORDER — OXYCODONE-ACETAMINOPHEN 5-325 MG PO TABS: 1.0000 | ORAL_TABLET | Freq: Once | ORAL | Status: DC

## 2016-04-20 MED ORDER — MIDAZOLAM HCL 2 MG/2ML IJ SOLN
INTRAMUSCULAR | Status: AC
  Filled 2016-04-20: qty 2

## 2016-04-20 MED ORDER — LACTATED RINGERS IV SOLN: INTRAVENOUS | Status: DC

## 2016-04-20 MED ORDER — LACTATED RINGERS IV SOLN
INTRAVENOUS | Status: DC | PRN
  Administered 2016-04-20: 14:00:00 via INTRAVENOUS

## 2016-04-20 MED ORDER — PHENYLEPHRINE HCL 10 MG/ML IJ SOLN
INTRAMUSCULAR | Status: DC | PRN
  Administered 2016-04-20 (×2): 80 ug via INTRAVENOUS

## 2016-04-20 MED ORDER — ROCURONIUM BROMIDE 100 MG/10ML IV SOLN
INTRAVENOUS | Status: DC | PRN
  Administered 2016-04-20: 50 mg via INTRAVENOUS

## 2016-04-20 MED ORDER — PROPOFOL 10 MG/ML IV BOLUS
INTRAVENOUS | Status: DC | PRN
  Administered 2016-04-20: 200 mg via INTRAVENOUS

## 2016-04-20 MED ORDER — MEPERIDINE HCL 25 MG/ML IJ SOLN: 6.2500 mg | INTRAMUSCULAR | Status: DC | PRN

## 2016-04-20 MED ORDER — PROPOFOL 10 MG/ML IV BOLUS
INTRAVENOUS | Status: AC
  Filled 2016-04-20: qty 20

## 2016-04-20 MED ORDER — FENTANYL CITRATE (PF) 100 MCG/2ML IJ SOLN
INTRAMUSCULAR | Status: DC
Start: 2016-04-20 — End: 2016-04-20
  Filled 2016-04-20: qty 2

## 2016-04-20 MED ORDER — FENTANYL CITRATE (PF) 100 MCG/2ML IJ SOLN
25.0000 ug | INTRAMUSCULAR | Status: DC | PRN
  Administered 2016-04-20 (×3): 50 ug via INTRAVENOUS

## 2016-04-20 MED ORDER — FENTANYL CITRATE (PF) 100 MCG/2ML IJ SOLN
50.0000 ug | Freq: Once | INTRAMUSCULAR | Status: AC
  Administered 2016-04-20: 50 ug via INTRAVENOUS

## 2016-04-20 MED ORDER — METOCLOPRAMIDE HCL 5 MG/ML IJ SOLN: 10.0000 mg | Freq: Once | INTRAMUSCULAR | Status: DC | PRN

## 2016-04-20 MED ORDER — ONDANSETRON HCL 4 MG/2ML IJ SOLN
INTRAMUSCULAR | Status: DC | PRN
  Administered 2016-04-20: 4 mg via INTRAVENOUS

## 2016-04-20 MED ORDER — SUGAMMADEX SODIUM 200 MG/2ML IV SOLN
INTRAVENOUS | Status: DC | PRN
  Administered 2016-04-20: 241.4 mg via INTRAVENOUS

## 2016-04-20 MED ORDER — FENTANYL CITRATE (PF) 100 MCG/2ML IJ SOLN
INTRAMUSCULAR | Status: DC | PRN
  Administered 2016-04-20: 50 ug via INTRAVENOUS
  Administered 2016-04-20: 100 ug via INTRAVENOUS
  Administered 2016-04-20: 50 ug via INTRAVENOUS

## 2016-04-20 MED ORDER — ROCURONIUM BROMIDE 10 MG/ML (PF) SYRINGE
PREFILLED_SYRINGE | INTRAVENOUS | Status: AC
  Filled 2016-04-20: qty 10

## 2016-04-20 MED ORDER — SODIUM CHLORIDE 0.9 % IR SOLN
Status: DC | PRN
  Administered 2016-04-20 (×2): 3000 mL

## 2016-04-20 MED ORDER — LACTATED RINGERS IV SOLN
INTRAVENOUS | Status: DC
  Administered 2016-04-20: 13:00:00 via INTRAVENOUS

## 2016-04-20 MED ORDER — LIDOCAINE HCL (CARDIAC) 20 MG/ML IV SOLN
INTRAVENOUS | Status: DC | PRN
  Administered 2016-04-20: 100 mg via INTRAVENOUS

## 2016-04-20 MED ORDER — 0.9 % SODIUM CHLORIDE (POUR BTL) OPTIME
TOPICAL | Status: DC | PRN
  Administered 2016-04-20: 1000 mL

## 2016-04-20 MED ORDER — OXYCODONE-ACETAMINOPHEN 5-325 MG PO TABS: 1.0000 | ORAL_TABLET | ORAL | 0 refills | Status: DC | PRN

## 2016-04-20 MED ORDER — LIDOCAINE 2% (20 MG/ML) 5 ML SYRINGE
INTRAMUSCULAR | Status: AC
  Filled 2016-04-20: qty 5

## 2016-04-20 MED ORDER — MIDAZOLAM HCL 5 MG/5ML IJ SOLN
INTRAMUSCULAR | Status: DC | PRN
  Administered 2016-04-20: 2 mg via INTRAVENOUS

## 2016-04-20 SURGICAL SUPPLY — 72 items
BANDAGE ESMARK 6X9 LF (GAUZE/BANDAGES/DRESSINGS) IMPLANT
BLADE CUDA 5.5 (BLADE) IMPLANT
BLADE GREAT WHITE 4.2 (BLADE) IMPLANT
BLADE INCISOR PLUS 5.5 (BLADE) IMPLANT
BLADE SAW SGTL 83.5X18.5 (BLADE) ×2 IMPLANT
BLADE SURG 10 STRL SS (BLADE) IMPLANT
BNDG COHESIVE 4X5 TAN STRL (GAUZE/BANDAGES/DRESSINGS) ×2 IMPLANT
BNDG COHESIVE 6X5 TAN STRL LF (GAUZE/BANDAGES/DRESSINGS) ×2 IMPLANT
BNDG ESMARK 6X9 LF (GAUZE/BANDAGES/DRESSINGS)
BNDG GAUZE ELAST 4 BULKY (GAUZE/BANDAGES/DRESSINGS) ×2 IMPLANT
BUR OVAL 4.0 (BURR) IMPLANT
COTTON STERILE ROLL (GAUZE/BANDAGES/DRESSINGS) ×2 IMPLANT
COVER MAYO STAND STRL (DRAPES) IMPLANT
COVER SURGICAL LIGHT HANDLE (MISCELLANEOUS) ×4 IMPLANT
CUFF TOURNIQUET SINGLE 34IN LL (TOURNIQUET CUFF) IMPLANT
CUFF TOURNIQUET SINGLE 44IN (TOURNIQUET CUFF) IMPLANT
DRAPE ARTHROSCOPY W/POUCH 114 (DRAPES) ×2 IMPLANT
DRAPE C-ARM MINI 42X72 WSTRAPS (DRAPES) IMPLANT
DRAPE INCISE IOBAN 66X45 STRL (DRAPES) ×2 IMPLANT
DRAPE OEC MINIVIEW 54X84 (DRAPES) ×2 IMPLANT
DRAPE PROXIMA HALF (DRAPES) ×2 IMPLANT
DRAPE U-SHAPE 47X51 STRL (DRAPES) ×2 IMPLANT
DRSG ADAPTIC 3X8 NADH LF (GAUZE/BANDAGES/DRESSINGS) ×2 IMPLANT
DRSG EMULSION OIL 3X3 NADH (GAUZE/BANDAGES/DRESSINGS) ×2 IMPLANT
DRSG PAD ABDOMINAL 8X10 ST (GAUZE/BANDAGES/DRESSINGS) ×2 IMPLANT
DURAPREP 26ML APPLICATOR (WOUND CARE) ×2 IMPLANT
ELECT REM PT RETURN 9FT ADLT (ELECTROSURGICAL) ×2
ELECTRODE REM PT RTRN 9FT ADLT (ELECTROSURGICAL) ×1 IMPLANT
GAUZE SPONGE 4X4 12PLY STRL (GAUZE/BANDAGES/DRESSINGS) ×2 IMPLANT
GLOVE BIOGEL PI IND STRL 9 (GLOVE) ×1 IMPLANT
GLOVE BIOGEL PI INDICATOR 9 (GLOVE) ×1
GLOVE SURG ORTHO 9.0 STRL STRW (GLOVE) ×2 IMPLANT
GOWN STRL REUS W/ TWL XL LVL3 (GOWN DISPOSABLE) ×3 IMPLANT
GOWN STRL REUS W/TWL XL LVL3 (GOWN DISPOSABLE) ×3
GUIDEWIRE THREADED 2.8 (WIRE) ×4 IMPLANT
KIT BASIN OR (CUSTOM PROCEDURE TRAY) ×2 IMPLANT
KIT ROOM TURNOVER OR (KITS) ×2 IMPLANT
MANIFOLD NEPTUNE II (INSTRUMENTS) ×2 IMPLANT
NEEDLE 18GX1X1/2 (RX/OR ONLY) (NEEDLE) ×2 IMPLANT
NS IRRIG 1000ML POUR BTL (IV SOLUTION) ×2 IMPLANT
PACK ARTHROSCOPY DSU (CUSTOM PROCEDURE TRAY) ×2 IMPLANT
PACK ORTHO EXTREMITY (CUSTOM PROCEDURE TRAY) ×2 IMPLANT
PAD ARMBOARD 7.5X6 YLW CONV (MISCELLANEOUS) ×4 IMPLANT
PAD CAST 4YDX4 CTTN HI CHSV (CAST SUPPLIES) ×1 IMPLANT
PADDING CAST COTTON 4X4 STRL (CAST SUPPLIES) ×1
PADDING CAST COTTON 6X4 STRL (CAST SUPPLIES) ×2 IMPLANT
PENCIL BUTTON HOLSTER BLD 10FT (ELECTRODE) ×2 IMPLANT
SCREW 6.5X70MM (Screw) ×2 IMPLANT
SCREW HEADLESS 6.5X80X16MM (Screw) ×2 IMPLANT
SET ARTHROSCOPY TUBING (MISCELLANEOUS) ×1
SET ARTHROSCOPY TUBING LN (MISCELLANEOUS) ×1 IMPLANT
SPONGE GAUZE 4X4 12PLY STER LF (GAUZE/BANDAGES/DRESSINGS) ×2 IMPLANT
SPONGE LAP 18X18 X RAY DECT (DISPOSABLE) ×2 IMPLANT
SPONGE LAP 4X18 X RAY DECT (DISPOSABLE) ×2 IMPLANT
STAPLER VISISTAT 35W (STAPLE) ×2 IMPLANT
SUCTION FRAZIER HANDLE 10FR (MISCELLANEOUS) ×1
SUCTION TUBE FRAZIER 10FR DISP (MISCELLANEOUS) ×1 IMPLANT
SUT ETHILON 2 0 PSLX (SUTURE) ×6 IMPLANT
SUT ETHILON 4 0 PS 2 18 (SUTURE) ×2 IMPLANT
SUT MNCRL AB 3-0 PS2 18 (SUTURE) IMPLANT
SUT VIC AB 2-0 CT1 27 (SUTURE)
SUT VIC AB 2-0 CT1 TAPERPNT 27 (SUTURE) IMPLANT
SUT VIC AB 2-0 CTB1 (SUTURE) ×4 IMPLANT
SYR 20CC LL (SYRINGE) ×2 IMPLANT
SYR BULB IRRIGATION 50ML (SYRINGE) ×2 IMPLANT
TAPE STRIPS DRAPE STRL (GAUZE/BANDAGES/DRESSINGS) IMPLANT
TOWEL OR 17X24 6PK STRL BLUE (TOWEL DISPOSABLE) ×2 IMPLANT
TOWEL OR 17X26 10 PK STRL BLUE (TOWEL DISPOSABLE) ×2 IMPLANT
TUBE CONNECTING 12X1/4 (SUCTIONS) ×2 IMPLANT
WAND HAND CNTRL MULTIVAC 90 (MISCELLANEOUS) IMPLANT
WAND SUCTION MAX 4MM 90S (SURGICAL WAND) ×2 IMPLANT
WATER STERILE IRR 1000ML POUR (IV SOLUTION) ×2 IMPLANT

## 2016-04-20 NOTE — Progress Notes (Signed)
Orthopedic Tech Progress Note Patient Details:  Garrett CodeJerome Ferrell 01/28/1961 [id number]  Ortho Devices Type of Ortho Device: CAM walker Ortho Device/Splint Location: rle Ortho Device/Splint Interventions: Application   Garrett Ferrell 04/20/2016, 4:33 PM

## 2016-04-20 NOTE — Anesthesia Preprocedure Evaluation (Addendum)
Anesthesia Evaluation  Patient identified by MRN, date of birth, ID band Patient awake    Reviewed: Allergy & Precautions, NPO status , Patient's Chart, lab work & pertinent test results  Airway Mallampati: II  TM Distance: >3 FB Neck ROM: Full    Dental no notable dental hx.    Pulmonary neg pulmonary ROS,    Pulmonary exam normal breath sounds clear to auscultation       Cardiovascular hypertension, Pt. on medications negative cardio ROS Normal cardiovascular exam+ dysrhythmias Atrial Fibrillation  Rhythm:Regular Rate:Normal     Neuro/Psych Seizures -,  PSYCHIATRIC DISORDERS    GI/Hepatic negative GI ROS, Neg liver ROS,   Endo/Other  negative endocrine ROS  Renal/GU negative Renal ROS  negative genitourinary   Musculoskeletal negative musculoskeletal ROS (+)   Abdominal   Peds negative pediatric ROS (+)  Hematology negative hematology ROS (+)   Anesthesia Other Findings   Reproductive/Obstetrics negative OB ROS                           Anesthesia Physical Anesthesia Plan  ASA: II  Anesthesia Plan: General   Post-op Pain Management:    Induction: Intravenous  Airway Management Planned: LMA  Additional Equipment:   Intra-op Plan:   Post-operative Plan: Extubation in OR  Informed Consent: I have reviewed the patients History and Physical, chart, labs and discussed the procedure including the risks, benefits and alternatives for the proposed anesthesia with the patient or authorized representative who has indicated his/her understanding and acceptance.   Dental advisory given  Plan Discussed with: CRNA  Anesthesia Plan Comments:         Anesthesia Quick Evaluation

## 2016-04-20 NOTE — Progress Notes (Signed)
Orthopedic Tech Progress Note Patient Details:  Lyndon CodeJerome Schwier 04/29/1961 [id number]  Ortho Devices Type of Ortho Device: Crutches Ortho Device/Splint Location: rle Ortho Device/Splint Interventions: Ordered, Adjustment   Jennye MoccasinHughes, Ayona Yniguez Craig 04/20/2016, 5:41 PM

## 2016-04-20 NOTE — Anesthesia Postprocedure Evaluation (Signed)
Anesthesia Post Note  Patient: Lyndon CodeJerome Breitenstein  Procedure(s) Performed: Procedure(s) (LRB): Right Posterior Arthroscopic Subtalar Arthrodesis (Right) ANKLE ARTHROSCOPY (Right)  Patient location during evaluation: PACU Anesthesia Type: General Level of consciousness: awake and alert Pain management: pain level controlled Vital Signs Assessment: post-procedure vital signs reviewed and stable Respiratory status: spontaneous breathing, nonlabored ventilation, respiratory function stable and patient connected to nasal cannula oxygen Cardiovascular status: blood pressure returned to baseline and stable Postop Assessment: no signs of nausea or vomiting Anesthetic complications: no    Last Vitals:  Vitals:   04/20/16 1645 04/20/16 1700  BP:  121/89  Pulse:    Resp: 16 16  Temp:      Last Pain:  Vitals:   04/20/16 1645  TempSrc:   PainSc: 8                  Phillips Groutarignan, Arial Galligan

## 2016-04-20 NOTE — Op Note (Signed)
04/20/2016  3:39 PM  PATIENT:  Garrett Ferrell    PRE-OPERATIVE DIAGNOSIS:  Subtalar Traumatic Arthritis   POST-OPERATIVE DIAGNOSIS:  Same  PROCEDURE:  Right Posterior Arthroscopic Subtalar Arthrodesis, ANKLE ARTHROSCOPY  SURGEON:  Nadara MustardMarcus V Duda, MD  PHYSICIAN ASSISTANT:None ANESTHESIA:   General  PREOPERATIVE INDICATIONS:  Garrett Ferrell is a  55 y.o. male with a diagnosis of Subtalar Traumatic Arthritis  who failed conservative measures and elected for surgical management.    The risks benefits and alternatives were discussed with the patient preoperatively including but not limited to the risks of infection, bleeding, nerve injury, cardiopulmonary complications, the need for revision surgery, among others, and the patient was willing to proceed.  OPERATIVE IMPLANTS: 6.5 headless cannulated screws  OPERATIVE FINDINGS: Hard sclerotic bone subtalar joint  OPERATIVE PROCEDURE: Patient brought the operating room and underwent a general anesthetic. After adequate anesthesia obtained patient's placed prone and the right lower extremity was prepped using DuraPrep draped into a sterile field. A timeout was called. 2 arthroscopic portals were made posterior medially and posterior laterally to the Achilles. Blunt trochars were used to insert into the retrocalcaneal bursa area. Using a vapor wand and a shaver the tissue was debrided back to the subtalar joint. A 18-gauge needles was used to localize the subtalar joint. Using the bur the subtalar joint was debrided of the articular cartilage. A ring curet was used to finalize the debridement. Using guidewires 2 guidewires were inserted from the calcaneus across the subtalar joint. C-arm floss be verified alignment and 26.5 headless cannulated screws were used to stabilize the subtalar joint. The incision and portals were closed using 2-0 nylon a sterile dressing was applied. Patient was extubated taken to the PACU in stable condition.

## 2016-04-20 NOTE — Anesthesia Procedure Notes (Signed)
Procedure Name: Intubation Date/Time: 04/20/2016 2:42 PM Performed by: Gavin PoundLOWDER, Shakima Nisley J Pre-anesthesia Checklist: Patient identified, Emergency Drugs available, Suction available, Patient being monitored and Timeout performed Patient Re-evaluated:Patient Re-evaluated prior to inductionOxygen Delivery Method: Circle system utilized Preoxygenation: Pre-oxygenation with 100% oxygen Intubation Type: IV induction Ventilation: Mask ventilation without difficulty Laryngoscope Size: Mac and 4 Grade View: Grade III Tube type: Oral Number of attempts: 1 Placement Confirmation: ETT inserted through vocal cords under direct vision,  positive ETCO2 and breath sounds checked- equal and bilateral Secured at: 21 cm Tube secured with: Tape Dental Injury: Teeth and Oropharynx as per pre-operative assessment

## 2016-04-20 NOTE — H&P (Signed)
Garrett Ferrell is an 55 y.o. male.   Chief Complaint: Subtalar arthrosis right. HPI: Patient is a 55 year old gentleman status post right ankle fracture with multiple scars around the ankle from previous intervention placement and removal of hardware.  Past Medical History:  Diagnosis Date  . Anxiety    takes Xanax daily as needed  . Arthritis   . Atrial fibrillation ([place])    takes Eliquis daily  . Depression    takes Risperdal daily  . DVT (deep venous thrombosis) ([place])    both legs in 2016  . Gout    takes Allopurinol daily  . Headache    occasionally  . History of shingles   . Hyperlipidemia   . Hyperlipidemia    takes Atorvastatin daily  . Hypertension    takes Metoprolol daily  . Insomnia    takes Ambien and Trazodone nightly  . Joint pain   . Joint swelling   . 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 Nocturia   . Seizures ([place])    last one in 2002    Past Surgical History:  Procedure Laterality Date  . ANKLE SURGERY Right    plates/screws  . HERNIA REPAIR     1986-umbilical  . RIGHT HEART CATH  2016   at [facility] during his DVT/PE    Family History  Problem Relation Age of Onset  . Heart Problems Mother   . Heart attack Father     age 43s   Social History:  reports that he has never smoked. He has never used smokeless tobacco. He reports that he drinks alcohol. He reports that he does not use drugs.  Allergies:  Allergies  Allergen Reactions  . No Known Allergies     No prescriptions prior to admission.    Results for orders placed or performed during the hospital encounter of 04/18/16 (from the past 48 hour(s))  Surgical pcr screen     Status: None   Collection Time: 04/18/16  2:49 PM  Result Value Ref Range   MRSA, PCR NEGATIVE NEGATIVE   Staphylococcus aureus NEGATIVE NEGATIVE    Comment:        The Xpert SA Assay (FDA approved for NASAL specimens in patients over 22 years of age), is one component of a  comprehensive surveillance program.  Test performance has been validated by [facility] for patients greater than or equal to 40 year old. It is not intended to diagnose infection nor to guide or monitor treatment.   Basic metabolic panel     Status: Abnormal   Collection Time: 04/18/16  2:50 PM  Result Value Ref Range   Sodium 140 135 - 145 mmol/L   Potassium 4.1 3.5 - 5.1 mmol/L   Chloride 106 101 - 111 mmol/L   CO2 26 22 - 32 mmol/L   Glucose, Bld 111 (H) 65 - 99 mg/dL   BUN 9 6 - 20 mg/dL   Creatinine, Ser 1.23 0.61 - 1.24 mg/dL   Calcium 8.9 8.9 - 10.3 mg/dL   GFR calc non Af Amer >60 >60 mL/min   GFR calc Af Amer >60 >60 mL/min    Comment: (NOTE) The eGFR has been calculated using the CKD EPI equation. This calculation has not been validated in all clinical situations. eGFR's persistently <60 mL/min signify possible Chronic Kidney Disease.    Anion gap 8 5 - 15  CBC     Status: Abnormal   Collection Time: 04/18/16  2:50 PM  Result Value Ref Range   WBC 4.3 4.0 - 10.5 K/uL   RBC 5.19 4.22 - 5.81 MIL/uL   Hemoglobin 12.0 (L) 13.0 - 17.0 g/dL   HCT 36.6 (L) 39.0 - 52.0 %   MCV 70.5 (L) 78.0 - 100.0 fL   MCH 23.1 (L) 26.0 - 34.0 pg   MCHC 32.8 30.0 - 36.0 g/dL   RDW 15.2 11.5 - 15.5 %   Platelets 167 150 - 400 K/uL  No blood products     Status: None   Collection Time: 04/18/16  3:02 PM  Result Value Ref Range   Transfuse no blood products      TRANSFUSE NO BLOOD PRODUCTS, VERIFIED BY KRISTI MOORE, RN   No results found.  Review of Systems  All other systems reviewed and are negative.   There were no vitals taken for this visit. Physical Exam  On examination patient is well-developed well-nourished no adenopathy. He has an antalgic gait. Examination he has a palpable pulse. There are scars medially and laterally and a lateral incision which goes down to the sinus Tarsi. Assessment/Plan Assessment: Subtalar arthrosis with large lateral incision was extended  down to the sinus Tarsi.  Plan: Patient would not be a good candidate for open sinus Tarsi incision for subtalar fusion. Patient will require a posterior arthroscopic approach for the subtalar fusion risk and benefits were discussed including risk of the wound not healing. Patient states he understands wish to proceed at this time.  [name], MD 04/20/2016, 8:29 AM

## 2016-04-20 NOTE — Transfer of Care (Signed)
Immediate Anesthesia Transfer of Care Note  Patient: Garrett Ferrell  Procedure(s) Performed: Procedure(s): Right Posterior Arthroscopic Subtalar Arthrodesis (Right) ANKLE ARTHROSCOPY (Right)  Patient Location: PACU  Anesthesia Type:General  Level of Consciousness: awake  Airway & Oxygen Therapy: Patient Spontanous Breathing  Post-op Assessment: Report given to RN and Post -op Vital signs reviewed and stable  Post vital signs: Reviewed and stable  Last Vitals:  Vitals:   04/20/16 1213 04/20/16 1603  BP: (!) 128/99   Pulse: 64   Resp: 20   Temp: 37.2 C 36.4 C    Last Pain:  Vitals:   04/20/16 1213  TempSrc: Oral         Complications: No apparent anesthesia complications

## 2016-04-23 ENCOUNTER — Telehealth (INDEPENDENT_AMBULATORY_CARE_PROVIDER_SITE_OTHER): Payer: Self-pay | Admitting: Orthopedic Surgery

## 2016-04-23 NOTE — Telephone Encounter (Signed)
This was faxed on Friday. I faxed again just now to the number provided att Alex as requested. Called pt to advise

## 2016-04-24 ENCOUNTER — Encounter (HOSPITAL_COMMUNITY): Payer: Self-pay | Admitting: Orthopedic Surgery

## 2016-05-01 ENCOUNTER — Ambulatory Visit (INDEPENDENT_AMBULATORY_CARE_PROVIDER_SITE_OTHER): Payer: Medicaid Other | Admitting: Family

## 2016-05-01 ENCOUNTER — Encounter (INDEPENDENT_AMBULATORY_CARE_PROVIDER_SITE_OTHER): Payer: Self-pay | Admitting: Orthopedic Surgery

## 2016-05-01 ENCOUNTER — Ambulatory Visit (INDEPENDENT_AMBULATORY_CARE_PROVIDER_SITE_OTHER): Payer: Self-pay

## 2016-05-01 ENCOUNTER — Ambulatory Visit (INDEPENDENT_AMBULATORY_CARE_PROVIDER_SITE_OTHER): Payer: Medicaid Other

## 2016-05-01 VITALS — Ht 72.0 in | Wt 266.0 lb

## 2016-05-01 DIAGNOSIS — M25571 Pain in right ankle and joints of right foot: Secondary | ICD-10-CM | POA: Diagnosis not present

## 2016-05-01 DIAGNOSIS — M25572 Pain in left ankle and joints of left foot: Secondary | ICD-10-CM

## 2016-05-01 MED ORDER — OXYCODONE-ACETAMINOPHEN 5-325 MG PO TABS: 1.0000 | ORAL_TABLET | Freq: Three times a day (TID) | ORAL | 0 refills | Status: DC | PRN

## 2016-05-01 NOTE — Progress Notes (Signed)
Office Visit Note   Patient: Garrett Ferrell           Date of Birth: 12/29/1960           MRN: [id number] Visit Date: 05/01/2016              Requested by: Jearld Leschwight M Williams, MD [address] [place], [place] [id number] PCP: Jearld Leschwight M Williams, MD   Assessment & Plan: Visit Diagnoses:  1. Acute left ankle pain   2. Pain in right ankle and joints of right foot   3. Acute right ankle pain     Plan: He will remain nonweightbearing in fracture boot and crutches. Sutures were harvested today. Dry dressing over incision. Continue daily wound cleansing with Dial soap. Follow-up in 2 more weeks with repeat radiographs of the ankle. He will work on dorsiflexion and free range of motion of the right ankle.  Follow-Up Instructions: Return in about 2 weeks (around 05/15/2016).   Orders:  Orders Placed This Encounter  Procedures  . XR Ankle Complete Right   Meds ordered this encounter  Medications  . oxyCODONE-acetaminophen (PERCOCET/ROXICET) 5-325 MG tablet    Sig: Take 1 tablet by mouth every 8 (eight) hours as needed for severe pain.    Dispense:  45 tablet    Refill:  0      Procedures: No procedures performed   Clinical Data: No additional findings.   Subjective: Chief Complaint  Patient presents with  . Right Ankle - Routine Post Op    Posterior arthroscopic subtalar arthrodesis 04/20/16    Patient is s/p 1 week and 4 days post op for a right PASTA.He is weight bearing with crutches in a fracture boot. States that he is not supposed to be putting weight on his foot at all but very clearly is. He is elevating higher than his heart at home and states that he is out of the Percocet rx that he was given at discharge from hospital. Patient's incision is well approximated and there is no drainage. Stitches are intact.     Review of Systems  Constitutional: Negative for chills and fever.     Objective: Vital Signs: Ht 6' (1.829 m)   Wt 266 lb (120.7 kg)   BMI 36.08  kg/m   Physical Exam  Ortho Exam Incisions are well healed. Scant dried blood. No erythema no drainage no sign of infection. Minimal swelling to the right ankle.  Specialty Comments:  No specialty comments available.  Imaging: Xr Ankle Complete Right  Result Date: 05/01/2016 3 views right ankle show Stable alignment of the internal fixation right ankle. no complicating features.    PMFS History: Patient Active Problem List   Diagnosis Date Noted  . Osteoarthritis of subtalar joint, right   . Right ankle pain 01/24/2016  . Bilateral hip pain 01/03/2016  . Atrial fibrillation ([facility]) 11/18/2015  . Essential hypertension 11/18/2015  . Hyperlipidemia 11/18/2015  . DVT, bilateral lower limbs ([facility]) 11/18/2015  . Pulmonary embolus ([facility]) 11/18/2015   Past Medical History:  Diagnosis Date  . Anxiety    takes Xanax daily as needed  . Arthritis   . Atrial fibrillation ([facility])    takes Eliquis daily  . Depression    takes Risperdal daily  . DVT (deep venous thrombosis) ([facility])    both legs in 2016  . Gout    takes Allopurinol daily  . Headache    occasionally  . History of shingles   . Hyperlipidemia   .  Hyperlipidemia    takes Atorvastatin daily  . Hypertension    takes Metoprolol daily  . Insomnia    takes Ambien and Trazodone nightly  . Joint pain   . Joint swelling   . 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 Nocturia   . Seizures ([facility])    last one in 2002    Family History  Problem Relation Age of Onset  . Heart Problems Mother   . Heart attack Father     age 3840s    Past Surgical History:  Procedure Laterality Date  . ANKLE ARTHROSCOPY Right 04/20/2016   Procedure: ANKLE ARTHROSCOPY;  Surgeon: Nadara MustardMarcus V Duda, MD;  [facility];  Service: Orthopedics;  Laterality: Right;  . ANKLE FUSION Right 04/20/2016   Procedure: Right Posterior Arthroscopic Subtalar Arthrodesis;  Surgeon: Nadara MustardMarcus V Duda, MD;  [facility];  Service: Orthopedics;  Laterality: Right;  .  ANKLE SURGERY Right    plates/screws  . HERNIA REPAIR     1986-umbilical  . RIGHT HEART CATH  2016   [facility]   Social History   Occupational History  . disabled    Social History Main Topics  . Smoking status: Never Smoker  . Smokeless tobacco: Never Used  . Alcohol use 0.0 oz/week     Comment: rarely  . Drug use: No  . Sexual activity: Not on file

## 2016-05-02 ENCOUNTER — Ambulatory Visit (INDEPENDENT_AMBULATORY_CARE_PROVIDER_SITE_OTHER): Payer: Medicaid Other | Admitting: Physician Assistant

## 2016-05-02 VITALS — BP 138/96 | HR 83 | Ht 72.0 in | Wt 265.8 lb

## 2016-05-02 DIAGNOSIS — E785 Hyperlipidemia, unspecified: Secondary | ICD-10-CM | POA: Diagnosis not present

## 2016-05-02 DIAGNOSIS — I10 Essential (primary) hypertension: Secondary | ICD-10-CM

## 2016-05-02 DIAGNOSIS — I482 Chronic atrial fibrillation, unspecified: Secondary | ICD-10-CM

## 2016-05-02 DIAGNOSIS — I2699 Other pulmonary embolism without acute cor pulmonale: Secondary | ICD-10-CM | POA: Diagnosis not present

## 2016-05-02 MED ORDER — METOPROLOL TARTRATE 50 MG PO TABS: 75.0000 mg | ORAL_TABLET | Freq: Two times a day (BID) | ORAL | 5 refills | Status: DC

## 2016-05-02 NOTE — Progress Notes (Signed)
Cardiology Office Note    Date:  05/03/2016   ID:  Garrett CodeJerome Maslowski, DOB 09/16/1960, MRN [id number]  PCP:  Jackie PlumSEI-BONSU,GEORGE, MD  Cardiologist:  Dr. Allyson SabalBerry   Chief Complaint  Patient presents with  . Follow-up    seen for Dr. Allyson SabalBerry, pt denied chest pain and SOB, pt just ankle surgery    History of Present Illness:  Garrett Ferrell is a 55 y.o. 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 He was recently establish with us on 11/18/2015. He never had heart attack or stroke himself, however has significant family history of CAD with his father died of MI in his 4340s. His mother has a pacemaker. He says he was on Pradaxa before, and transitioned to eliquis. He was on a long trip in 2016, when he forgot to take his eliquis for 3 days, he ended up in the hospital with DVT and PE. Based on outside [facility] [place], he had a left popliteal DVT at the time, V/Q skin read as high likelihood of PE, CTA confirmed extensive bilateral pulmonary emboli. He eventually underwent right heart catheterization with EKOS therapy on 03/24/2015 for saddle emboli. Left coronary was not injected.  I saw the patient on 03/28/2016 for preoperative clearance for ankle surgery by Dr. Lajoyce Cornersuda. He does have T-wave inversion in the anterior leads however denies any chest discomfort. After discussing with M.D., we proceeded with outpatient stress test on 04/05/2016 which came back low risk study, small defect of mild severity present in the basal inferior location consistent with diaphragmatic attenuation artifact. He was cleared for surgery and underwent intended procedure on 04/20/2016, procedure proceeded without obvious complication. He was extubated afterward and recovered well. He was discharged on the same day. He was seen in orthopedic office yesterday, suture has been  removed. He was instructed on dorsiflexion and free range of motion of the right ankle. He presents today for cardiology office visit. He has restarted eliquis the day after surgery. He denies any shortness of breath lower extremity edema, chest discomfort, or strokelike symptoms. Overall he is recovering from the surgery quite well. There is no acute issue today. He does have potential oral surgery coming up.    Past Medical History:  Diagnosis Date  . Anxiety    takes Xanax daily as needed  . Arthritis   . Atrial fibrillation (HCC)    takes Eliquis daily  . Depression    takes Risperdal daily  . DVT (deep venous thrombosis) (HCC)    both legs in 2016  . Gout    takes Allopurinol daily  . Headache    occasionally  . History of shingles   . Hyperlipidemia   . Hyperlipidemia    takes Atorvastatin daily  . Hypertension    takes Metoprolol daily  . Insomnia    takes Ambien and Trazodone nightly  . Joint pain   . Joint swelling   . 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 Nocturia   . Seizures (HCC)    last one in 2002    Past Surgical History:  Procedure Laterality Date  . ANKLE ARTHROSCOPY Right 04/20/2016   Procedure: ANKLE ARTHROSCOPY;  Surgeon: Nadara MustardMarcus V Duda, MD;  [facility];  Service: Orthopedics;  Laterality: Right;  . ANKLE FUSION Right 04/20/2016   Procedure: Right Posterior Arthroscopic Subtalar Arthrodesis;  Surgeon: Randa EvensMarcus V  Lajoyce Corners, MD;  Location: [facility] OR;  Service: Orthopedics;  Laterality: Right;  . ANKLE SURGERY Right    plates/screws  . HERNIA REPAIR     1986-umbilical  . RIGHT HEART CATH  2016   at [facility] during his DVT/PE    Current Medications: Outpatient Medications Prior to Visit  Medication Sig Dispense Refill  . allopurinol (ZYLOPRIM) 100 MG tablet Take 100 mg by mouth daily.  5  . ALPRAZolam (XANAX) 0.5 MG tablet Take 0.5 mg by mouth every 8 (eight) hours as needed. for anxiety  0  . atorvastatin (LIPITOR) 10 MG  tablet Take 10 mg by mouth daily.    [name] ELIQUIS 5 MG TABS tablet Take 5 mg by mouth 2 (two) times daily.  3  . meloxicam (MOBIC) 15 MG tablet Take 1 tablet (15 mg total) by mouth daily. 30 tablet 1  . oxyCODONE-acetaminophen (PERCOCET/ROXICET) 5-325 MG tablet Take 1 tablet by mouth every 8 (eight) hours as needed for severe pain. 45 tablet 0  . risperiDONE (RISPERDAL) 1 MG tablet Take 1 mg by mouth 2 (two) times daily.  5  . SEROQUEL XR 150 MG 24 hr tablet Take 150 mg by mouth at bedtime.  1  . trazodone (DESYREL) 300 MG tablet Take 300 mg by mouth at bedtime.  5  . VIAGRA 100 MG tablet Take 100 mg by mouth daily as needed for erectile dysfunction.   2  . zolpidem (AMBIEN) 10 MG tablet Take 10 mg by mouth at bedtime as needed for sleep.    . metoprolol (LOPRESSOR) 50 MG tablet Take 50 mg by mouth 2 (two) times daily.  6   No facility-administered medications prior to visit.      Allergies:   No known allergies   Social History   Social History  . Marital status: Married    Spouse name: [name]  . Number of children: 5  . Years of education: N/A   Occupational History  . disabled    Social History Main Topics  . Smoking status: Never Smoker  . Smokeless tobacco: Never Used  . Alcohol use 0.0 oz/week     Comment: rarely  . Drug use: No  . Sexual activity: Not Asked   Other Topics Concern  . None   Social History Narrative  . None     Family History:  The patient's family history includes Heart Problems in his mother; Heart attack in his father.   ROS:   Please see the history of present illness.    ROS All other systems reviewed and are negative.   PHYSICAL EXAM:   VS:  BP (!) 138/96   Pulse 83   Ht 6' (1.829 m)   Wt 265 lb 12.8 oz (120.6 kg)   BMI 36.05 kg/m    GEN: Well nourished, well developed, in no acute distress  HEENT: normal  Neck: no JVD, carotid bruits, or masses Cardiac: Irregularly irregular; no murmurs, rubs, or gallops,no edema  Respiratory:   clear to auscultation bilaterally, normal work of breathing GI: soft, nontender, nondistended, + BS MS: no deformity or atrophy  Skin: warm and dry, no rash Neuro:  Alert and Oriented x 3, Strength and sensation are intact Psych: euthymic mood, full affect  Wt Readings from Last 3 Encounters:  05/02/16 265 lb 12.8 oz (120.6 kg)  05/01/16 266 lb (120.7 kg)  04/20/16 266 lb (120.7 kg)      Studies/Labs Reviewed:   EKG:  EKG is  not ordered today.    Recent Labs: 04/18/2016: BUN 9; Creatinine, Ser 1.23; Hemoglobin 12.0; Platelets 167; Potassium 4.1; Sodium 140   Lipid Panel No results found for: CHOL, TRIG, HDL, CHOLHDL, VLDL, LDLCALC, LDLDIRECT  Additional studies/ records that were reviewed today include:   Outside report from [facility] in [place], previous right heart cath report.  Recent surgical report   ASSESSMENT:    1. Chronic atrial fibrillation (HCC)   2. Essential hypertension   3. Hyperlipidemia, unspecified hyperlipidemia type   4. Other pulmonary embolism without acute cor pulmonale, unspecified chronicity (HCC)      PLAN:  In order of problems listed above:  1. Chronic atrial fibrillation  -  First diagnosed in 1999, permanent by this point. He has been compliant with eliquis. EKG continued to show atrial fibrillation, however rate controlled with heart rate of 86.             - This patients CHA2DS2-VASc Score and unadjusted Ischemic Stroke Rate (% per year) is equal to 0.6 % stroke rate/year from a score of 1  Above score calculated as 1 point each if present [CHF, HTN, DM, Vascular=MI/PAD/Aortic Plaque, Age if 65-74, or Male] Above score calculated as 2 points each if present [Age > 75, or Stroke/TIA/TE  - Recovering quite well after recent ankle surgery. He has resumed eliquis without any complication. He has potential upcoming oral dental surgery as well, again given recent negative workup, he would be cleared for the surgery and was instructed  to hold eliquis for 2 days prior to the surgery.  2. H/o DVT/PE: had RHC with EKOS for saddle emboli in October 2016. He never had DVT and PE prior to that point, he attributed to the cause as coming off of eliquis for 3 days and had a prolonged flight where he was sedentary.  3. HTN: Blood pressure continues to be elevated today, I will increase his metoprolol to 75 mg twice a day.    Medication Adjustments/Labs and Tests Ordered: Current medicines are reviewed at length with the patient today.  Concerns regarding medicines are outlined above.  Medication changes, Labs and Tests ordered today are listed in the Patient Instructions below. Patient Instructions  Medication Instructions:  INCREASE- Metoprolol 1 1/2 Tablets twice a day  Labwork: None Ordered  Testing/Procedures: None Ordered  Follow-Up: Your physician recommends that you schedule a follow-up appointment in: 4-6 Months with Dr Allyson SabalBerry   Any Other Special Instructions Will Be Listed Below (If Applicable).     If you need a refill on your cardiac medications before your next appointment, please call your pharmacy.      Ramond DialSigned, Clydine Parkison, [place]  05/03/2016 12:14 AM    [facility] [address], [place], [place]  [address] Phone: [phone]; Fax: [phone]

## 2016-05-02 NOTE — Patient Instructions (Signed)
Medication Instructions:  INCREASE- Metoprolol 1 1/2 Tablets twice a day  Labwork: None Ordered  Testing/Procedures: None Ordered  Follow-Up: Your physician recommends that you schedule a follow-up appointment in: 4-6 Months with Dr Allyson SabalBerry   Any Other Special Instructions Will Be Listed Below (If Applicable).     If you need a refill on your cardiac medications before your next appointment, please call your pharmacy.

## 2016-05-03 ENCOUNTER — Encounter: Payer: Self-pay | Admitting: Physician Assistant

## 2016-05-15 ENCOUNTER — Ambulatory Visit (INDEPENDENT_AMBULATORY_CARE_PROVIDER_SITE_OTHER): Payer: Medicaid Other | Admitting: Orthopedic Surgery

## 2016-05-15 ENCOUNTER — Ambulatory Visit (INDEPENDENT_AMBULATORY_CARE_PROVIDER_SITE_OTHER): Payer: Medicaid Other

## 2016-05-15 DIAGNOSIS — M19071 Primary osteoarthritis, right ankle and foot: Secondary | ICD-10-CM

## 2016-05-15 DIAGNOSIS — M25571 Pain in right ankle and joints of right foot: Secondary | ICD-10-CM | POA: Diagnosis not present

## 2016-05-15 MED ORDER — OXYCODONE-ACETAMINOPHEN 5-325 MG PO TABS: 1.0000 | ORAL_TABLET | ORAL | 0 refills | Status: DC | PRN

## 2016-05-15 NOTE — Progress Notes (Signed)
Office Visit Note   Patient: Garrett Ferrell           Date of Birth: 10/14/1960           MRN: [id number] Visit Date: 05/15/2016              Requested by: Jearld Leschwight M Williams, MD [address] , [place] [id number] PCP: Jackie PlumSEI-BONSU,GEORGE, MD   Assessment & Plan: Visit Diagnoses:  1. Osteoarthritis of right subtalar joint   2. Pain in right ankle and joints of right foot     Plan: 3 weeks status post subtalar fusion the right for traumatic arthritis. Plan advance to weightbearing as tolerated in the fracture boot. Report patient's hemoglobin has dropped from 12 to about 10 he has been taking Mobicox recommended he discontinue the immobilizer also recommended the patient follow up with his primary care physician to be set up for a colonoscopy. We will refill his prescription for Percocet.  Follow-Up Instructions: Return in about 4 weeks (around 06/12/2016).   Orders:  Orders Placed This Encounter  Procedures  . XR Ankle Complete Right   Meds ordered this encounter  Medications  . oxyCODONE-acetaminophen (PERCOCET/ROXICET) 5-325 MG tablet    Sig: Take 1 tablet by mouth every 4 (four) hours as needed for severe pain.    Dispense:  60 tablet    Refill:  0      Procedures: No procedures performed   Clinical Data: No additional findings.   Subjective: Chief Complaint  Patient presents with  . Right Ankle - Routine Post Op    Posterior arthroscopic subtalar arthrodesis 04/20/16    3 weeks s/p Posterior arthroscopic subtalar arthrodesis 04/20/16.  Patient is non weight bearing with crutches and fracture boot.  He states that he takes one percoet a day as needed for pain and that he does not have any questions or concerns today.    Review of Systems   Objective: Vital Signs: There were no vitals taken for this visit.  Physical Exam on examination patient has good range of motion of his ankle the subtalar fusion is stable the portals are clean and dry. Patient  denies any pain or symptoms. He has very minimal swelling. There is no redness no synovitis no signs of infection.  Ortho Exam  Specialty Comments:  No specialty comments available.  Imaging: Xr Ankle Complete Right  Result Date: 05/15/2016 Three-view radiographs of the right hindfoot shows a stable internal fixation for subtalar fusion    PMFS History: Patient Active Problem List   Diagnosis Date Noted  . Osteoarthritis of right subtalar joint   . Right ankle pain 01/24/2016  . Bilateral hip pain 01/03/2016  . Atrial fibrillation (HCC) 11/18/2015  . Essential hypertension 11/18/2015  . Hyperlipidemia 11/18/2015  . DVT, bilateral lower limbs (HCC) 11/18/2015  . Pulmonary embolus (HCC) 11/18/2015   Past Medical History:  Diagnosis Date  . Anxiety    takes Xanax daily as needed  . Arthritis   . Atrial fibrillation (HCC)    takes Eliquis daily  . Depression    takes Risperdal daily  . DVT (deep venous thrombosis) (HCC)    both legs in 2016  . Gout    takes Allopurinol daily  . Headache    occasionally  . History of shingles   . Hyperlipidemia   . Hyperlipidemia    takes Atorvastatin daily  . Hypertension    takes Metoprolol daily  . Insomnia    takes Ambien and Trazodone nightly  .  Joint pain   . Joint swelling   . 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 Nocturia   . Seizures (HCC)    last one in 2002    Family History  Problem Relation Age of Onset  . Heart Problems Mother   . Heart attack Father     age 5640s    Past Surgical History:  Procedure Laterality Date  . ANKLE ARTHROSCOPY Right 04/20/2016   Procedure: ANKLE ARTHROSCOPY;  Surgeon: Nadara MustardMarcus V Macon Lesesne, MD;  [facility];  Service: Orthopedics;  Laterality: Right;  . ANKLE FUSION Right 04/20/2016   Procedure: Right Posterior Arthroscopic Subtalar Arthrodesis;  Surgeon: Nadara MustardMarcus V Aviella Disbrow, MD;  [facility];  Service: Orthopedics;  Laterality: Right;  . ANKLE SURGERY Right    plates/screws  . HERNIA  REPAIR     1986-umbilical  . RIGHT HEART CATH  2016   [facility] his [facility]   Social History   Occupational History  . disabled    Social History Main Topics  . Smoking status: Never Smoker  . Smokeless tobacco: Never Used  . Alcohol use 0.0 oz/week     Comment: rarely  . Drug use: No  . Sexual activity: Not on file

## 2016-06-08 ENCOUNTER — Ambulatory Visit (INDEPENDENT_AMBULATORY_CARE_PROVIDER_SITE_OTHER): Payer: Medicaid Other | Admitting: Orthopedic Surgery

## 2016-06-08 ENCOUNTER — Encounter (INDEPENDENT_AMBULATORY_CARE_PROVIDER_SITE_OTHER): Payer: Self-pay | Admitting: Orthopedic Surgery

## 2016-06-08 ENCOUNTER — Ambulatory Visit (INDEPENDENT_AMBULATORY_CARE_PROVIDER_SITE_OTHER): Payer: Medicaid Other

## 2016-06-08 VITALS — Ht 72.0 in | Wt 265.0 lb

## 2016-06-08 DIAGNOSIS — M25571 Pain in right ankle and joints of right foot: Secondary | ICD-10-CM

## 2016-06-08 DIAGNOSIS — M79671 Pain in right foot: Secondary | ICD-10-CM | POA: Diagnosis not present

## 2016-06-08 DIAGNOSIS — M25374 Other instability, right foot: Secondary | ICD-10-CM

## 2016-06-08 MED ORDER — OXYCODONE-ACETAMINOPHEN 5-325 MG PO TABS
1.0000 | ORAL_TABLET | ORAL | 0 refills | Status: DC | PRN
Start: 2016-06-08 — End: 2016-08-07

## 2016-06-08 NOTE — Progress Notes (Signed)
Office Visit Note   Patient: Garrett Ferrell           Date of Birth: [birth year]           MRN: [id number] Visit Date: 06/08/2016              Requested by: Jackie Plum, MD [address] [place], [place] [address] PCP: Jackie Plum, MD  Chief Complaint  Patient presents with  . Right Ankle - Routine Post Op    Posterior arthroscopic subtalar arthrodesis 04/20/16      HPI: Patient is full weight bearing in a fracture boot. He states that about 2 weeks ago he was getting out of the car and hit his ankle on the sidewalk and had acute onset of pain. He complains of pain at the ball of his foot and the medical side of his ankle and heel pain. He was not wearing his boot at the time of his injury. States that he is taking percocet 5/325 bid for pain. Rodena Medin, RMA  Patient's [facility] substance control report was reviewed.  Assessment & Plan: Visit Diagnoses:  1. Pain in right foot   2. Pain in right ankle and joints of right foot   3. Subtalar joint instability, right     Plan: Continue the fracture boot recommended foam padding beneath the heel to provide better support follow-up in the office in 3 weeks with repeat 2 view radiographs of the right foot at follow-up.  Follow-Up Instructions: Return in about 3 weeks (around 06/29/2016).   Ortho Exam Examination patient has no skin breakdown and no cellulitis no signs of infection. Radiographs show stable internal fixation.  Imaging: Xr Ankle Complete Right  Result Date: 06/08/2016 Two-view radiographs of the right ankle shows a stable subtalar fusion no complicating features of the ankle joint no fractures.  Xr Foot Complete Right  Result Date: 06/08/2016 Two-view radiographs of the right foot shows stable internal fixation of the subtalar joint. No complicating features from the patient's recent trauma.   Orders:  Orders Placed This Encounter  Procedures  . XR Foot Complete Right  . XR  Ankle Complete Right   Meds ordered this encounter  Medications  . oxyCODONE-acetaminophen (PERCOCET/ROXICET) 5-325 MG tablet    Sig: Take 1 tablet by mouth every 4 (four) hours as needed for severe pain.    Dispense:  60 tablet    Refill:  0     Procedures: No procedures performed  Clinical Data: No additional findings.  Subjective: Review of Systems  Objective: Vital Signs: Ht 6' (1.829 m)   Wt 265 lb (120.2 kg)   BMI 35.94 kg/m   Specialty Comments:  No specialty comments available.  PMFS History: Patient Active Problem List   Diagnosis Date Noted  . 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 Osteoarthritis of right subtalar joint   . Right ankle pain 01/24/2016  . Bilateral hip pain 01/03/2016  . Atrial fibrillation (HCC) 11/18/2015  . Essential hypertension 11/18/2015  . Hyperlipidemia 11/18/2015  . DVT, bilateral lower limbs (HCC) 11/18/2015  . Pulmonary embolus (HCC) 11/18/2015   Past Medical History:  Diagnosis Date  . Anxiety    takes Xanax daily as needed  . Arthritis   . Atrial fibrillation (HCC)    takes Eliquis daily  . Depression    takes Risperdal daily  . DVT (deep venous thrombosis) (HCC)    both legs in 2016  . Gout    takes Allopurinol daily  .  Headache    occasionally  . History of shingles   . Hyperlipidemia   . Hyperlipidemia    takes Atorvastatin daily  . Hypertension    takes Metoprolol daily  . Insomnia    takes Ambien and Trazodone nightly  . Joint pain   . Joint swelling   . 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 Nocturia   . Seizures (HCC)    last one in 2002    Family History  Problem Relation Age of Onset  . Heart Problems Mother   . Heart attack Father     age 5340s    Past Surgical History:  Procedure Laterality Date  . ANKLE ARTHROSCOPY Right 04/20/2016   Procedure: ANKLE ARTHROSCOPY;  Surgeon: Nadara MustardMarcus V Aalani Aikens, MD;  [facility];  Service: Orthopedics;  Laterality: Right;  . ANKLE FUSION Right  04/20/2016   Procedure: Right Posterior Arthroscopic Subtalar Arthrodesis;  Surgeon: Nadara MustardMarcus V Randi College, MD;  [facility];  Service: Orthopedics;  Laterality: Right;  . ANKLE SURGERY Right    plates/screws  . HERNIA REPAIR     1986-umbilical  . RIGHT HEART CATH  [facility]   [facility] his [facility]   Social History   Occupational History  . disabled    Social History Main Topics  . Smoking status: Never Smoker  . Smokeless tobacco: Never Used  . Alcohol use 0.0 oz/week     Comment: rarely  . Drug use: No  . Sexual activity: Not on file

## 2016-06-12 ENCOUNTER — Ambulatory Visit (INDEPENDENT_AMBULATORY_CARE_PROVIDER_SITE_OTHER): Payer: Medicaid Other | Admitting: Orthopedic Surgery

## 2016-07-03 ENCOUNTER — Encounter (INDEPENDENT_AMBULATORY_CARE_PROVIDER_SITE_OTHER): Payer: Self-pay | Admitting: Orthopedic Surgery

## 2016-07-03 ENCOUNTER — Ambulatory Visit (INDEPENDENT_AMBULATORY_CARE_PROVIDER_SITE_OTHER): Payer: Medicaid Other | Admitting: Orthopedic Surgery

## 2016-07-03 ENCOUNTER — Ambulatory Visit (INDEPENDENT_AMBULATORY_CARE_PROVIDER_SITE_OTHER): Payer: Medicaid Other

## 2016-07-03 VITALS — Ht 72.0 in | Wt 265.0 lb

## 2016-07-03 DIAGNOSIS — M25571 Pain in right ankle and joints of right foot: Secondary | ICD-10-CM

## 2016-07-03 DIAGNOSIS — M79671 Pain in right foot: Secondary | ICD-10-CM

## 2016-07-03 DIAGNOSIS — G8929 Other chronic pain: Secondary | ICD-10-CM | POA: Diagnosis not present

## 2016-07-03 DIAGNOSIS — M19071 Primary osteoarthritis, right ankle and foot: Secondary | ICD-10-CM

## 2016-07-03 MED ORDER — LIDOCAINE HCL 1 % IJ SOLN
2.0000 mL | INTRAMUSCULAR | Status: AC | PRN
  Administered 2016-07-03: 2 mL

## 2016-07-03 MED ORDER — OXYCODONE-ACETAMINOPHEN 5-325 MG PO TABS: 1.0000 | ORAL_TABLET | Freq: Three times a day (TID) | ORAL | 0 refills | Status: DC | PRN

## 2016-07-03 MED ORDER — METHYLPREDNISOLONE ACETATE 40 MG/ML IJ SUSP
40.0000 mg | INTRAMUSCULAR | Status: AC | PRN
  Administered 2016-07-03: 40 mg via INTRA_ARTICULAR

## 2016-07-03 NOTE — Addendum Note (Signed)
Addended by: Aldean BakerUDA, Kamri Gotsch on: 07/03/2016 03:09 PM   Modules accepted: Orders

## 2016-07-03 NOTE — Progress Notes (Addendum)
Office Visit Note   Patient: Garrett Ferrell           Date of Birth: 1961-04-19           MRN: [id number] Visit Date: 07/03/2016              Requested by: Jackie Plum, MD [address], [place] [address] PCP: Jackie Plum, MD  Chief Complaint  Patient presents with  . Right Ankle - Follow-up    Posterior arthroscopic subtalar arthrodesis 04/20/16    HPI: Patient comes in for follow up right foot. He is status post posterior arthroscopic subtalar arthrodesis 04/20/16. He is doing well overall. He discontinued fracture boot yesterday. He is having to take oxycodone for his pain, he requests a refill today. Donalee Citrin, RT   Patient is about 10 weeks status post subtalar fusion on the right.  Assessment & Plan: Visit Diagnoses:  1. Chronic pain of right ankle   2. Pain in right foot   3. Osteoarthritis of right subtalar joint     Plan: right ankle injected for diagnostic evaluation. Discussed that if patient gets very minimal relief with the right ankle injection that he most likely would need to proceed with a ankle fusion. Discussed that if we get good temporary relief with the injection patient may benefit from an ankle arthroscopic debridement.  Follow-Up Instructions: Return in about 4 weeks (around 07/31/2016).   Ortho Exam On examination patient has limited range of motion of the right ankle his subtalar joint is well fused there is no redness no synovitis no signs of infection. Patient is primarily painful over the anterior ankle joint line at this time.  Imaging: Xr Foot Complete Right  Result Date: 07/03/2016 Three-view radiographs of the right foot shows stable subtalar fusion no complicating features no hardware failure. The ankle joint is congruent.   Orders:  Orders Placed This Encounter  Procedures  . Medium Joint Injection/Arthrocentesis  . XR Foot Complete Right   Meds ordered this encounter  Medications  . lidocaine  (XYLOCAINE) 1 % (with pres) injection 2 mL  . methylPREDNISolone acetate (DEPO-MEDROL) injection 40 mg  . oxyCODONE-acetaminophen (PERCOCET/ROXICET) 5-325 MG tablet    Sig: Take 1 tablet by mouth every 8 (eight) hours as needed for severe pain.    Dispense:  45 tablet    Refill:  0     Procedures: Medium Joint Inj Date/Time: 07/03/2016 3:00 PM Performed by: Bernhardt Riemenschneider V Authorized by: Nadara Mustard   Consent Given by:  Patient Site marked: the procedure site was marked   Timeout: prior to procedure the correct patient, procedure, and site was verified   Indications:  Pain and diagnostic evaluation Location:  Ankle Site:  R ankle Prep: patient was prepped and draped in usual sterile fashion   Needle Size:  22 G Needle Length:  1.5 inches Approach:  Anteromedial Ultrasound Guided: No   Fluoroscopic Guidance: No   Medications:  2 mL lidocaine 1 %; 40 mg methylPREDNISolone acetate 40 MG/ML Aspiration Attempted: No   Patient tolerance:  Patient tolerated the procedure well with no immediate complications    Clinical Data: No additional findings.  Subjective: Review of Systems  Objective: Vital Signs: Ht 6' (1.829 m)   Wt 265 lb (120.2 kg)   BMI 35.94 kg/m   Specialty Comments:  No specialty comments available.  PMFS History: Patient Active Problem List   Diagnosis Date Noted  . 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  Osteoarthritis of right subtalar joint   . Right ankle pain 01/24/2016  . Bilateral hip pain 01/03/2016  . Atrial fibrillation (HCC) 11/18/2015  . Essential hypertension 11/18/2015  . Hyperlipidemia 11/18/2015  . DVT, bilateral lower limbs (HCC) 11/18/2015  . Pulmonary embolus (HCC) 11/18/2015   Past Medical History:  Diagnosis Date  . Anxiety    takes Xanax daily as needed  . Arthritis   . Atrial fibrillation (HCC)    takes Eliquis daily  . Depression    takes Risperdal daily  . DVT (deep venous thrombosis) (HCC)    both legs in  2016  . Gout    takes Allopurinol daily  . Headache    occasionally  . History of shingles   . Hyperlipidemia   . Hyperlipidemia    takes Atorvastatin daily  . Hypertension    takes Metoprolol daily  . Insomnia    takes Ambien and Trazodone nightly  . Joint pain   . Joint swelling   . 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 Nocturia   . Seizures (HCC)    last one in 2002    Family History  Problem Relation Age of Onset  . Heart Problems Mother   . Heart attack Father     age 4240s    Past Surgical History:  Procedure Laterality Date  . ANKLE ARTHROSCOPY Right 04/20/2016   Procedure: ANKLE ARTHROSCOPY;  Surgeon: Nadara MustardMarcus V Gennesis Hogland, MD;  [facility];  Service: Orthopedics;  Laterality: Right;  . ANKLE FUSION Right 04/20/2016   Procedure: Right Posterior Arthroscopic Subtalar Arthrodesis;  Surgeon: Nadara MustardMarcus V Astha Probasco, MD;  [facility];  Service: Orthopedics;  Laterality: Right;  . ANKLE SURGERY Right    plates/screws  . HERNIA REPAIR     1986-umbilical  . RIGHT HEART CATH  2016   [facility] his [facility]   Social History   Occupational History  . disabled    Social History Main Topics  . Smoking status: Never Smoker  . Smokeless tobacco: Never Used  . Alcohol use 0.0 oz/week     Comment: rarely  . Drug use: No  . Sexual activity: Not on file

## 2016-07-30 ENCOUNTER — Telehealth: Payer: Self-pay | Admitting: Cardiovascular Disease

## 2016-07-30 NOTE — Telephone Encounter (Signed)
New Message   Request for surgical clearance:  1. What type of surgery is being performed? Colonoscopy w possible biopsy    2. When is this surgery scheduled? 08/14/16  3. Are there any medications that need to be held prior to surgery and how long? Eliquis 4 days?  4. Name of physician performing surgery? Dr Danise EdgeMartin Johnson   5. What is your office phone and fax number? Fax [phone]

## 2016-07-31 ENCOUNTER — Encounter (INDEPENDENT_AMBULATORY_CARE_PROVIDER_SITE_OTHER): Payer: Self-pay | Admitting: Orthopedic Surgery

## 2016-07-31 ENCOUNTER — Other Ambulatory Visit: Payer: Self-pay | Admitting: Gastroenterology

## 2016-07-31 ENCOUNTER — Ambulatory Visit (INDEPENDENT_AMBULATORY_CARE_PROVIDER_SITE_OTHER): Payer: Medicaid Other | Admitting: Orthopedic Surgery

## 2016-07-31 DIAGNOSIS — M12571 Traumatic arthropathy, right ankle and foot: Secondary | ICD-10-CM | POA: Diagnosis not present

## 2016-07-31 MED ORDER — OXYCODONE-ACETAMINOPHEN 5-325 MG PO TABS: 1.0000 | ORAL_TABLET | Freq: Three times a day (TID) | ORAL | 0 refills | Status: DC | PRN

## 2016-07-31 NOTE — Addendum Note (Signed)
Addended by: Aldean BakerUDA, MARCUS on: 07/31/2016 02:37 PM   Modules accepted: Orders

## 2016-07-31 NOTE — Progress Notes (Addendum)
Office Visit Note   Patient: Garrett Ferrell           Date of Birth: 10/23/1960           MRN: [id number] Visit Date: 07/31/2016              Requested by: Jackie PlumGeorge Osei-Bonsu, MD [address] [place], [place] [id number] PCP: Jackie PlumSEI-BONSU,GEORGE, MD  Chief Complaint  Patient presents with  . Right Ankle - Follow-up    Posterior arthroscopic subtalar arthrodesis 04/20/16    HPI: Patient is 56 y.o male who presents today for follow up left foot. He is status post posterior arthroscopic subtalar arthrodesis 04/20/16. He is 3 months 10 days post op. He is status post ankle injection 07/03/16. He complains of minimal relief of injection. Donalee CitrinStepheney L Peele, RT    Assessment & Plan: Visit Diagnoses:  1. Traumatic arthritis of right ankle     Plan: Patient only had temporary relief with an intra-articular injection for the traumatic arthritis of the right ankle. Radiographically there is significant arthritis and do not feel that arthroscopic debridement would help. Patient has a BMI greater than 40 and requires a very durable Renella CunasM Flores activities of daily living we will plan for arthroscopic ankle fusion. Risk and benefits were discussed including infection nonhealing the bone need for additional surgery. Patient states he understands wishes to proceed at this time.  Follow-Up Instructions: Return in about 2 weeks (around 08/14/2016).   Ortho Exam Semination patient is alert oriented no adenopathy well-developed well-nourished normal affect. Antalgic gait. Examination he has tenderness to palpation anteriorly over the ankle. He only has about 20 range of motion ankle joint. The subtalar joint is well-healed. He has a good dorsalis pedis pulse the skin is intact. There are  multiple scars around the ankle there is some venous stasis swelling.  Imaging: No results found.  Orders:  No orders of the defined types were placed in this encounter.  Meds ordered this encounter    Medications  . oxyCODONE-acetaminophen (PERCOCET/ROXICET) 5-325 MG tablet    Sig: Take 1 tablet by mouth every 8 (eight) hours as needed for severe pain.    Dispense:  30 tablet    Refill:  0     Procedures: No procedures performed  Clinical Data: No additional findings.  Subjective: Review of Systems  Objective: Vital Signs: There were no vitals taken for this visit.  Specialty Comments:  No specialty comments available.  PMFS History: Patient Active Problem List   Diagnosis Date Noted  . Traumatic arthritis of right ankle 07/31/2016  . 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 Osteoarthritis of right subtalar joint   . Right ankle pain 01/24/2016  . Bilateral hip pain 01/03/2016  . Atrial fibrillation (HCC) 11/18/2015  . Essential hypertension 11/18/2015  . Hyperlipidemia 11/18/2015  . DVT, bilateral lower limbs (HCC) 11/18/2015  . Pulmonary embolus (HCC) 11/18/2015   Past Medical History:  Diagnosis Date  . Anxiety    takes Xanax daily as needed  . Arthritis   . Atrial fibrillation (HCC)    takes Eliquis daily  . Depression    takes Risperdal daily  . DVT (deep venous thrombosis) (HCC)    both legs in 2016  . Gout    takes Allopurinol daily  . Headache    occasionally  . History of shingles   . Hyperlipidemia   . Hyperlipidemia    takes Atorvastatin daily  . Hypertension    takes Metoprolol daily  .  Insomnia    takes Ambien and Trazodone nightly  . Joint pain   . Joint swelling   . 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 Nocturia   . Seizures (HCC)    last one in 2002    Family History  Problem Relation Age of Onset  . Heart Problems Mother   . Heart attack Father     age 47s    Past Surgical History:  Procedure Laterality Date  . ANKLE ARTHROSCOPY Right 04/20/2016   Procedure: ANKLE ARTHROSCOPY;  Surgeon: Nadara Mustard, MD;  Location: [facility] OR;  Service: Orthopedics;  Laterality: Right;  . ANKLE FUSION Right 04/20/2016   Procedure:  Right Posterior Arthroscopic Subtalar Arthrodesis;  Surgeon: Nadara Mustard, MD;  Location: [facility] OR;  Service: Orthopedics;  Laterality: Right;  . ANKLE SURGERY Right    plates/screws  . HERNIA REPAIR     1986-umbilical  . RIGHT HEART CATH  2016   at [facility] during his DVT/PE   Social History   Occupational History  . disabled    Social History Main Topics  . Smoking status: Never Smoker  . Smokeless tobacco: Never Used  . Alcohol use 0.0 oz/week     Comment: rarely  . Drug use: No  . Sexual activity: Not on file

## 2016-08-03 NOTE — Telephone Encounter (Signed)
Please call Garrett Ferrell today about this clearance,their fax machine is not working.

## 2016-08-04 NOTE — Telephone Encounter (Signed)
OK to interrupt Eliquis for Colonoscopy. Stop 3 days before

## 2016-08-06 NOTE — Telephone Encounter (Signed)
Clearance routed to Fax number provided via EPIC.

## 2016-08-07 ENCOUNTER — Encounter (HOSPITAL_COMMUNITY): Payer: Self-pay | Admitting: Emergency Medicine

## 2016-08-07 ENCOUNTER — Emergency Department (HOSPITAL_COMMUNITY): Payer: Medicaid Other

## 2016-08-07 ENCOUNTER — Emergency Department (HOSPITAL_BASED_OUTPATIENT_CLINIC_OR_DEPARTMENT_OTHER): Payer: Medicaid Other

## 2016-08-07 ENCOUNTER — Observation Stay (HOSPITAL_COMMUNITY)
Admission: EM | Admit: 2016-08-07 | Discharge: 2016-08-08 | Disposition: A | Discharge status: Home / Self Care | Payer: Medicaid Other | Attending: Internal Medicine | Admitting: Internal Medicine

## 2016-08-07 DIAGNOSIS — M7989 Other specified soft tissue disorders: Secondary | ICD-10-CM | POA: Diagnosis not present

## 2016-08-07 DIAGNOSIS — Z7901 Long term (current) use of anticoagulants: Secondary | ICD-10-CM | POA: Diagnosis not present

## 2016-08-07 DIAGNOSIS — M7918 Myalgia, other site: Secondary | ICD-10-CM

## 2016-08-07 DIAGNOSIS — F1410 Cocaine abuse, uncomplicated: Secondary | ICD-10-CM | POA: Insufficient documentation

## 2016-08-07 DIAGNOSIS — E785 Hyperlipidemia, unspecified: Secondary | ICD-10-CM | POA: Diagnosis present

## 2016-08-07 DIAGNOSIS — M79609 Pain in unspecified limb: Secondary | ICD-10-CM

## 2016-08-07 DIAGNOSIS — I129 Hypertensive chronic kidney disease with stage 1 through stage 4 chronic kidney disease, or unspecified chronic kidney disease: Secondary | ICD-10-CM | POA: Insufficient documentation

## 2016-08-07 DIAGNOSIS — Z79899 Other long term (current) drug therapy: Secondary | ICD-10-CM | POA: Diagnosis not present

## 2016-08-07 DIAGNOSIS — R079 Chest pain, unspecified: Secondary | ICD-10-CM | POA: Diagnosis present

## 2016-08-07 DIAGNOSIS — I482 Chronic atrial fibrillation, unspecified: Secondary | ICD-10-CM

## 2016-08-07 DIAGNOSIS — F1490 Cocaine use, unspecified, uncomplicated: Secondary | ICD-10-CM

## 2016-08-07 DIAGNOSIS — N182 Chronic kidney disease, stage 2 (mild): Secondary | ICD-10-CM | POA: Diagnosis not present

## 2016-08-07 DIAGNOSIS — I4891 Unspecified atrial fibrillation: Secondary | ICD-10-CM | POA: Diagnosis not present

## 2016-08-07 DIAGNOSIS — M791 Myalgia: Secondary | ICD-10-CM | POA: Diagnosis not present

## 2016-08-07 DIAGNOSIS — F418 Other specified anxiety disorders: Secondary | ICD-10-CM

## 2016-08-07 DIAGNOSIS — I10 Essential (primary) hypertension: Secondary | ICD-10-CM | POA: Diagnosis not present

## 2016-08-07 DIAGNOSIS — N189 Chronic kidney disease, unspecified: Secondary | ICD-10-CM

## 2016-08-07 DIAGNOSIS — G8929 Other chronic pain: Secondary | ICD-10-CM

## 2016-08-07 DIAGNOSIS — Z86718 Personal history of other venous thrombosis and embolism: Secondary | ICD-10-CM

## 2016-08-07 LAB — BASIC METABOLIC PANEL
Anion gap: 14 (ref 5–15)
BUN: 17 mg/dL (ref 6–20)
CO2: 21 mmol/L — AB (ref 22–32)
CREATININE: 1.53 mg/dL — AB (ref 0.61–1.24)
Calcium: 10.1 mg/dL (ref 8.9–10.3)
Chloride: 106 mmol/L (ref 101–111)
GFR calc Af Amer: 57 mL/min — ABNORMAL LOW (ref >60)
GFR calc non Af Amer: 50 mL/min — ABNORMAL LOW (ref >60)
GLUCOSE: 111 mg/dL — AB (ref 65–99)
POTASSIUM: 4.7 mmol/L (ref 3.5–5.1)
Sodium: 141 mmol/L (ref 135–145)

## 2016-08-07 LAB — TROPONIN I

## 2016-08-07 LAB — CBC
HCT: 38.6 % — ABNORMAL LOW (ref 39.0–52.0)
HEMOGLOBIN: 13 g/dL (ref 13.0–17.0)
MCH: 23.6 pg — AB (ref 26.0–34.0)
MCHC: 33.7 g/dL (ref 30.0–36.0)
MCV: 70.1 fL — ABNORMAL LOW (ref 78.0–100.0)
Platelets: 167 10*3/uL (ref 150–400)
RBC: 5.51 MIL/uL (ref 4.22–5.81)
RDW: 15 % (ref 11.5–15.5)
WBC: 9.6 10*3/uL (ref 4.0–10.5)

## 2016-08-07 LAB — I-STAT TROPONIN, ED
Troponin i, poc: 0 ng/mL (ref 0.00–0.08)
Troponin i, poc: 0 ng/mL (ref 0.00–0.08)

## 2016-08-07 MED ORDER — ACETAMINOPHEN 325 MG PO TABS: 650.0000 mg | ORAL_TABLET | Freq: Four times a day (QID) | ORAL | Status: DC | PRN

## 2016-08-07 MED ORDER — ASPIRIN 81 MG PO CHEW: 324.0000 mg | CHEWABLE_TABLET | Freq: Once | ORAL | Status: DC

## 2016-08-07 MED ORDER — ONDANSETRON HCL 4 MG PO TABS: 4.0000 mg | ORAL_TABLET | Freq: Four times a day (QID) | ORAL | Status: DC | PRN

## 2016-08-07 MED ORDER — ASPIRIN EC 325 MG PO TBEC
325.0000 mg | DELAYED_RELEASE_TABLET | Freq: Every day | ORAL | Status: DC
  Administered 2016-08-08: 325 mg via ORAL
  Filled 2016-08-07: qty 1

## 2016-08-07 MED ORDER — METOPROLOL TARTRATE 5 MG/5ML IV SOLN: 5.0000 mg | Freq: Once | INTRAVENOUS | Status: DC

## 2016-08-07 MED ORDER — APIXABAN 5 MG PO TABS
5.0000 mg | ORAL_TABLET | Freq: Two times a day (BID) | ORAL | Status: DC
  Administered 2016-08-07 – 2016-08-08 (×2): 5 mg via ORAL
  Filled 2016-08-07: qty 2
  Filled 2016-08-07: qty 1

## 2016-08-07 MED ORDER — PEG 3350-KCL-NA BICARB-NACL 420 G PO SOLR: 4000.0000 mL | Freq: Once | ORAL | Status: DC

## 2016-08-07 MED ORDER — DILTIAZEM HCL ER COATED BEADS 180 MG PO CP24
180.0000 mg | ORAL_CAPSULE | Freq: Every day | ORAL | Status: DC
  Administered 2016-08-07 – 2016-08-08 (×2): 180 mg via ORAL
  Filled 2016-08-07 (×2): qty 1

## 2016-08-07 MED ORDER — TRAZODONE HCL 100 MG PO TABS
300.0000 mg | ORAL_TABLET | Freq: Every day | ORAL | Status: DC
  Administered 2016-08-07: 300 mg via ORAL
  Filled 2016-08-07: qty 3

## 2016-08-07 MED ORDER — ALLOPURINOL 100 MG PO TABS
100.0000 mg | ORAL_TABLET | Freq: Every day | ORAL | Status: DC
  Administered 2016-08-07 – 2016-08-08 (×2): 100 mg via ORAL
  Filled 2016-08-07 (×2): qty 1

## 2016-08-07 MED ORDER — QUETIAPINE FUMARATE ER 50 MG PO TB24
150.0000 mg | ORAL_TABLET | Freq: Every day | ORAL | Status: DC
  Administered 2016-08-07: 150 mg via ORAL
  Filled 2016-08-07: qty 3

## 2016-08-07 MED ORDER — LORAZEPAM 2 MG/ML IJ SOLN
0.5000 mg | Freq: Once | INTRAMUSCULAR | Status: AC
  Administered 2016-08-07: 0.5 mg via INTRAVENOUS
  Filled 2016-08-07: qty 1

## 2016-08-07 MED ORDER — INFLUENZA VAC SPLIT QUAD 0.5 ML IM SUSY
0.5000 mL | PREFILLED_SYRINGE | INTRAMUSCULAR | Status: AC
  Administered 2016-08-08: 0.5 mL via INTRAMUSCULAR
  Filled 2016-08-07: qty 0.5

## 2016-08-07 MED ORDER — ALPRAZOLAM 0.5 MG PO TABS: 0.5000 mg | ORAL_TABLET | Freq: Three times a day (TID) | ORAL | Status: DC | PRN

## 2016-08-07 MED ORDER — MORPHINE SULFATE (PF) 4 MG/ML IV SOLN
4.0000 mg | Freq: Once | INTRAVENOUS | Status: AC
  Administered 2016-08-07: 4 mg via INTRAVENOUS
  Filled 2016-08-07: qty 1

## 2016-08-07 MED ORDER — MELOXICAM 7.5 MG PO TABS
15.0000 mg | ORAL_TABLET | Freq: Every day | ORAL | Status: DC
  Administered 2016-08-07 – 2016-08-08 (×2): 15 mg via ORAL
  Filled 2016-08-07: qty 1
  Filled 2016-08-07: qty 2
  Filled 2016-08-07: qty 1
  Filled 2016-08-07: qty 2

## 2016-08-07 MED ORDER — TIZANIDINE HCL 4 MG PO TABS
2.0000 mg | ORAL_TABLET | Freq: Three times a day (TID) | ORAL | Status: DC | PRN
  Filled 2016-08-07: qty 1

## 2016-08-07 MED ORDER — RISPERIDONE 1 MG PO TABS
1.0000 mg | ORAL_TABLET | Freq: Every day | ORAL | Status: DC
  Administered 2016-08-07 – 2016-08-08 (×2): 1 mg via ORAL
  Filled 2016-08-07 (×2): qty 1

## 2016-08-07 MED ORDER — ATORVASTATIN CALCIUM 10 MG PO TABS
10.0000 mg | ORAL_TABLET | Freq: Every day | ORAL | Status: DC
  Administered 2016-08-07: 10 mg via ORAL
  Filled 2016-08-07: qty 1

## 2016-08-07 MED ORDER — ONDANSETRON HCL 4 MG/2ML IJ SOLN: 4.0000 mg | Freq: Four times a day (QID) | INTRAMUSCULAR | Status: DC | PRN

## 2016-08-07 MED ORDER — GI COCKTAIL ~~LOC~~: 30.0000 mL | Freq: Four times a day (QID) | ORAL | Status: DC | PRN

## 2016-08-07 MED ORDER — OXYCODONE-ACETAMINOPHEN 5-325 MG PO TABS
1.0000 | ORAL_TABLET | Freq: Three times a day (TID) | ORAL | Status: DC | PRN
  Administered 2016-08-07: 1 via ORAL
  Filled 2016-08-07: qty 1

## 2016-08-07 MED ORDER — DILTIAZEM HCL 25 MG/5ML IV SOLN
10.0000 mg | Freq: Once | INTRAVENOUS | Status: AC
  Administered 2016-08-07: 10 mg via INTRAVENOUS
  Filled 2016-08-07: qty 5

## 2016-08-07 MED ORDER — ACETAMINOPHEN 650 MG RE SUPP: 650.0000 mg | Freq: Four times a day (QID) | RECTAL | Status: DC | PRN

## 2016-08-07 MED ORDER — ONDANSETRON HCL 4 MG/2ML IJ SOLN
4.0000 mg | Freq: Once | INTRAMUSCULAR | Status: AC
  Administered 2016-08-07: 4 mg via INTRAVENOUS
  Filled 2016-08-07: qty 2

## 2016-08-07 NOTE — ED Notes (Signed)
During orthostatics (sitting and standing), pt stated that he felt "a little dizzy". Pt's pulse rate was also inconsistent.

## 2016-08-07 NOTE — ED Notes (Signed)
Pt returns from radiology, placed back on tele.

## 2016-08-07 NOTE — Progress Notes (Signed)
Pt expressed concerns that he did not want any medical information shared in front of or giving to his wife. Pt appears to have CP with inspiration (grunting at times), does not want to put oxygen on at present time. O2 sats 95% on RA. Cont to monitor. Emelda Brothershristy Yuji Walth RN

## 2016-08-07 NOTE — ED Notes (Signed)
Notified patient that his care team and nurses will be administering his daily and nighttime medications and that he can either have his personal medications locked up or his wife can take them back home.

## 2016-08-07 NOTE — H&P (Signed)
History and Physical    Noach Calvillo ZOX:[id number] DOB: [birth year] DOA: 08/07/2016  PCP: Jackie Plum, MD Patient coming from: Home  Chief Complaint: CP and leg pain  HPI: Selig Wampole is a 56 y.o. male with medical history significant of anxiety, atrial fibrillation, DVT, gout, headaches, hypertension, seizures presenting with exertional chest pain and left lower extremity pain. Patient states that chest pain started at approximately 6 AM on day of admission after patient took cocaine. Pain is left-sided and associated with shortness of breath. Worse with exertion. EMS was called and patient received aspirin and nitroglycerin in the field without significant relief. Patient also endorses onset of left lower extremity pain during this period of time patient states that it feels similar to his previous pain in his leg when he developed his DVT. Patient states compliance with his Xarelto. Leg pain does not radiate.  Patient states that he had a normal cardiac catheterization approximately 2 years ago living in [place].    ED Course: Objective findings outlined below. Heart rate improved after administration of Ativan and metoprolol with improvement in chest pain after receiving aspirin and morphine.  Review of Systems: As per HPI otherwise 10 point review of systems negative.   Ambulatory Status:no restrictions  Past Medical History:  Diagnosis Date  . Anxiety    takes Xanax daily as needed  . Arthritis   . Atrial fibrillation (HCC)    takes Eliquis daily  . Depression    takes Risperdal daily  . DVT (deep venous thrombosis) (HCC)    both legs in 2016  . Gout    takes Allopurinol daily  . Headache    occasionally  . History of shingles   . Hyperlipidemia   . Hyperlipidemia    takes Atorvastatin daily  . Hypertension    takes Metoprolol daily  . Insomnia    takes Ambien and Trazodone nightly  . Joint pain   . Joint swelling   . 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  Nocturia   . Seizures (HCC)    last one in 2002    Past Surgical History:  Procedure Laterality Date  . ANKLE ARTHROSCOPY Right 04/20/2016   Procedure: ANKLE ARTHROSCOPY;  Surgeon: Nadara Mustard, MD;  Location: [facility] OR;  Service: Orthopedics;  Laterality: Right;  . ANKLE FUSION Right 04/20/2016   Procedure: Right Posterior Arthroscopic Subtalar Arthrodesis;  Surgeon: Nadara Mustard, MD;  [facility];  Service: Orthopedics;  Laterality: Right;  . ANKLE SURGERY Right    plates/screws  . HERNIA REPAIR     1986-umbilical  . RIGHT HEART CATH  2016   at [facility] during his DVT/PE    Social History   Social History  . Marital status: Married    Spouse name: [name]  . Number of children: 5  . Years of education: N/A   Occupational History  . disabled    Social History Main Topics  . Smoking status: Never Smoker  . Smokeless tobacco: Never Used  . Alcohol use 0.0 oz/week     Comment: rarely  . Drug use: No  . Sexual activity: Not on file   Other Topics Concern  . Not on file   Social History Narrative  . No narrative on file    Allergies  Allergen Reactions  . No Known Allergies     Family History  Problem Relation Age of Onset  . Heart Problems Mother   . Heart attack Father  age 6340s    Prior to Admission medications   Medication Sig Start Date End Date Taking? Authorizing Provider  allopurinol (ZYLOPRIM) 100 MG tablet Take 100 mg by mouth daily. 12/22/15  Yes Historical Provider, MD  ALPRAZolam Prudy Feeler(XANAX) 0.5 MG tablet Take 0.5 mg by mouth every 8 (eight) hours as needed. for anxiety 10/21/15  Yes Historical Provider, MD  atorvastatin (LIPITOR) 10 MG tablet Take 10 mg by mouth daily.   Yes Historical Provider, MD  ELIQUIS 5 MG TABS tablet Take 5 mg by mouth 2 (two) times daily. 11/03/15  Yes Historical Provider, MD  ferrous sulfate 325 (65 FE) MG EC tablet Take 325 mg by mouth daily with breakfast.   Yes Historical Provider, MD    meloxicam (MOBIC) 15 MG tablet Take 1 tablet (15 mg total) by mouth daily. 01/09/16  Yes Myra RudeJeremy E Schmitz, MD  metoprolol (LOPRESSOR) 50 MG tablet Take 1.5 tablets (75 mg total) by mouth 2 (two) times daily. 05/02/16  Yes Azalee CourseHao Meng, PA  oxyCODONE-acetaminophen (PERCOCET/ROXICET) 5-325 MG tablet Take 1 tablet by mouth every 8 (eight) hours as needed for severe pain. 07/31/16  Yes Nadara MustardMarcus Duda V, MD  polyethylene glycol-electrolytes (GAVILYTE-N WITH FLAVOR PACK) 420 g solution Take 4,000 mLs by mouth once.   Yes Historical Provider, MD  risperiDONE (RISPERDAL) 1 MG tablet Take 1 mg by mouth daily.  12/23/15  Yes Historical Provider, MD  SEROQUEL XR 150 MG 24 hr tablet Take 150 mg by mouth at bedtime. 12/23/15  Yes Historical Provider, MD  tiZANidine (ZANAFLEX) 2 MG tablet Take 2-4 mg by mouth every 8 (eight) hours as needed for muscle spasms.  06/02/16  Yes Historical Provider, MD  trazodone (DESYREL) 300 MG tablet Take 300 mg by mouth at bedtime. 10/24/15  Yes Historical Provider, MD  VIAGRA 100 MG tablet Take 100 mg by mouth daily as needed for erectile dysfunction.  09/30/15  Yes Historical Provider, MD  zolpidem (AMBIEN) 10 MG tablet Take 10 mg by mouth at bedtime as needed for sleep.   Yes Historical Provider, MD    Physical Exam: Vitals:   08/07/16 1100 08/07/16 1103 08/07/16 1145 08/07/16 1245  BP: 129/99 129/97 127/88 129/93  Pulse: 85 85 106 84  Resp: 15 22 21 14   Temp:      TempSrc:      SpO2: 99% 96% 99% 97%  Weight:      Height:         General:  Appears calm and comfortable Eyes:  PERRL, EOMI, normal lids, iris ENT: poor dentition,  grossly normal hearing, lips & tongue Neck:  no LAD, masses or thyromegaly Cardiovascular:  RRR, no m/r/g. No LE edema.  Respiratory:  CTA bilaterally, no w/r/r. Normal respiratory effort. Abdomen:  soft, ntnd, NABS Skin: numerous forehead scars,  no rash or induration seen on limited exam Musculoskeletal:  grossly normal tone BUE/BLE, good ROM, no  bony abnormality Psychiatric:  grossly normal mood and affect, speech fluent and appropriate, AOx3 Neurologic:  CN 2-12 grossly intact, moves all extremities in coordinated fashion, sensation intact  Labs on Admission: I have personally reviewed following labs and imaging studies  CBC:  Recent Labs Lab 08/07/16 0904  WBC 9.6  HGB 13.0  HCT 38.6*  MCV 70.1*  PLT 167   Basic Metabolic Panel:  Recent Labs Lab 08/07/16 0904  NA 141  K 4.7  CL 106  CO2 21*  GLUCOSE 111*  BUN 17  CREATININE 1.53*  CALCIUM 10.1   GFR:  Estimated Creatinine Clearance: 72.3 mL/min (by C-G formula based on SCr of 1.53 mg/dL (H)). Liver Function Tests: No results for input(s): AST, ALT, ALKPHOS, BILITOT, PROT, ALBUMIN in the last 168 hours. No results for input(s): LIPASE, AMYLASE in the last 168 hours. No results for input(s): AMMONIA in the last 168 hours. Coagulation Profile: No results for input(s): INR, PROTIME in the last 168 hours. Cardiac Enzymes: No results for input(s): CKTOTAL, CKMB, CKMBINDEX, TROPONINI in the last 168 hours. BNP (last 3 results) No results for input(s): PROBNP in the last 8760 hours. HbA1C: No results for input(s): HGBA1C in the last 72 hours. CBG: No results for input(s): GLUCAP in the last 168 hours. Lipid Profile: No results for input(s): CHOL, HDL, LDLCALC, TRIG, CHOLHDL, LDLDIRECT in the last 72 hours. Thyroid Function Tests: No results for input(s): TSH, T4TOTAL, FREET4, T3FREE, THYROIDAB in the last 72 hours. Anemia Panel: No results for input(s): VITAMINB12, FOLATE, FERRITIN, TIBC, IRON, RETICCTPCT in the last 72 hours. Urine analysis: No results found for: COLORURINE, APPEARANCEUR, LABSPEC, PHURINE, GLUCOSEU, HGBUR, BILIRUBINUR, KETONESUR, PROTEINUR, UROBILINOGEN, NITRITE, LEUKOCYTESUR  Creatinine Clearance: Estimated Creatinine Clearance: 72.3 mL/min (by C-G formula based on SCr of 1.53 mg/dL (H)).  Sepsis  Labs: @LABRCNTIP (procalcitonin:4,lacticidven:4) )No results found for this or any previous visit (from the past 240 hour(s)).   Radiological Exams on Admission: Dg Chest 2 View  Result Date: 08/07/2016 CLINICAL DATA:  Chest pain, shortness of breath, atrial fibrillation with rapid ventricular response. EXAM: CHEST  2 VIEW COMPARISON:  None in PACs FINDINGS: The lungs are mildly hypoinflated. The lung markings are coarse in the left lower lobe. There is no pleural effusion or pneumothorax. The cardiac silhouette is mildly enlarged. There is calcification in the wall of the aortic arch. The trachea is midline. The bony thorax exhibits no acute abnormality. IMPRESSION: Coarse lung markings in the left lower lobe suggest atelectasis or pneumonia. Followup PA and lateral chest X-ray is recommended in 3-4 weeks following trial of antibiotic therapy to ensure resolution and exclude underlying malignancy. Thoracic aortic atherosclerosis. Electronically Signed   By: David  [place] M.D.   On: 08/07/2016 09:41    EKG: Independently reviewed. Afib. No ACS  Assessment/Plan Active Problems:   Atrial fibrillation [facility])   Essential hypertension   Hyperlipidemia   Chest pain   Cocaine use   Chronic musculoskeletal pain   CKD (chronic kidney disease)   Depression with anxiety   CP: Likely from cocaine use in the setting of Afib and a h/o nml cardiac cath in 2016. Trop neg x2. EKG w/o ACS. CXR w/o infectious process.  - tele - cycle trop - BP and HR control - morphine, ASA, Nitro - Cards consult in am if not resolved.   Afib: tachycardia likely from pain and stimulant use. No tru RVR. Improving w/ pain control and single dose of dilt - continue Eliquis,  - Start Dilt (Stop Metop due to ongoing cocaine use)  Depression/Anxiety: - continue seroquel, xanax, risperdal  Chronic MSK pain: - continue oxycodone, Meloxicam, Zanaflex  Cocaine use: endorses use just prior to admission.  - UDS - PT DOES NOT  WANT OTHER FAMILY MEMBERS TO KNOW THAT HE USED COCAINE JUST PRIOR TO ADMISSION  CKD: Cr 1.53. Near baseline - BMET in am  LLE pain: doppler negative for DVT. Suspect from cocaine use. Possible PAD. Pulses present and currently asymptomatic - monitor for recurrence.   Insomnia: - continue trazodone  DVT prophylaxis: Eliquis  Code Status: full  Family Communication: none  Disposition Plan: pending cp r/o  Consults called: none  Admission status: observation , tele    MERRELL, DAVID J MD [facility]  If 7PM-7AM, please contact night-coverage [email or web address] Password [email or web address]  08/07/2016, 3:41 PM

## 2016-08-07 NOTE — ED Notes (Signed)
Patient transported to Ultrasound 

## 2016-08-07 NOTE — ED Notes (Signed)
IV at left [facility] will n ot flush.

## 2016-08-07 NOTE — ED Provider Notes (Signed)
[facility]-EMERGENCY DEPT Provider Note   CSN: [id number] Arrival date & time: 08/07/16  4098     History   Chief Complaint Chief Complaint  Patient presents with  . Chest Pain  . Shortness of Breath    HPI Garrett Ferrell is a 56 y.o. male.  Patient with history of chronic atrial fibrillation on metoprolol for rate control, on Xarelto (history of DVT/PE) -- presents with complaint of chest pain. Patient states that he got into an argument with his wife. Patient took cocaine at approximately 6 AM. He began having chest pain on the left side with associated shortness of breath. Patient called the ambulance. He was given aspirin and a nitroglycerin without relief. Pain is currently 6 out of 10. Patient also has complaint of left lower extremity pain starting acutely this morning. This reminded him of his previous DVT which led to pulmonary embolism. He states that he has been compliant with his anticoagulation and has not missed any doses. Pain does not radiate. No recent exertional pain. The onset of this condition was acute. The course is constant. Aggravating factors: none. Alleviating factors: none.        Past Medical History:  Diagnosis Date  . Anxiety    takes Xanax daily as needed  . Arthritis   . Atrial fibrillation (HCC)    takes Eliquis daily  . Depression    takes Risperdal daily  . DVT (deep venous thrombosis) (HCC)    both legs in 2016  . Gout    takes Allopurinol daily  . Headache    occasionally  . History of shingles   . Hyperlipidemia   . Hyperlipidemia    takes Atorvastatin daily  . Hypertension    takes Metoprolol daily  . Insomnia    takes Ambien and Trazodone nightly  . Joint pain   . Joint swelling   . 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 Nocturia   . Seizures (HCC)    last one in 2002    Patient Active Problem List   Diagnosis Date Noted  . Traumatic arthritis of right ankle 07/31/2016  . 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  Osteoarthritis of right subtalar joint   . Right ankle pain 01/24/2016  . Bilateral hip pain 01/03/2016  . Atrial fibrillation (HCC) 11/18/2015  . Essential hypertension 11/18/2015  . Hyperlipidemia 11/18/2015  . DVT, bilateral lower limbs (HCC) 11/18/2015  . Pulmonary embolus (HCC) 11/18/2015    Past Surgical History:  Procedure Laterality Date  . ANKLE ARTHROSCOPY Right 04/20/2016   Procedure: ANKLE ARTHROSCOPY;  Surgeon: Nadara Mustard, MD;  Location: [facility] OR;  Service: Orthopedics;  Laterality: Right;  . ANKLE FUSION Right 04/20/2016   Procedure: Right Posterior Arthroscopic Subtalar Arthrodesis;  Surgeon: Nadara Mustard, MD;  Location: [facility] OR;  Service: Orthopedics;  Laterality: Right;  . ANKLE SURGERY Right    plates/screws  . HERNIA REPAIR     1986-umbilical  . RIGHT HEART CATH  2016   at [facility] during his DVT/PE       Home Medications    Prior to Admission medications   Medication Sig Start Date End Date Taking? Authorizing Provider  allopurinol (ZYLOPRIM) 100 MG tablet Take 100 mg by mouth daily. 12/22/15   Historical Provider, MD  ALPRAZolam Prudy Feeler) 0.5 MG tablet Take 0.5 mg by mouth every 8 (eight) hours as needed. for anxiety 10/21/15   Historical Provider, MD  atorvastatin (LIPITOR) 10 MG tablet Take  10 mg by mouth daily.    Historical Provider, MD  ELIQUIS 5 MG TABS tablet Take 5 mg by mouth 2 (two) times daily. 11/03/15   Historical Provider, MD  meloxicam (MOBIC) 15 MG tablet Take 1 tablet (15 mg total) by mouth daily. 01/09/16   Myra RudeJeremy E Schmitz, MD  metoprolol (LOPRESSOR) 50 MG tablet Take 1.5 tablets (75 mg total) by mouth 2 (two) times daily. 05/02/16   Azalee CourseHao Meng, PA  oxyCODONE-acetaminophen (PERCOCET/ROXICET) 5-325 MG tablet Take 1 tablet by mouth every 4 (four) hours as needed for severe pain. Patient not taking: Reported on 07/03/2016 05/15/16   Nadara MustardMarcus Duda V, MD  oxyCODONE-acetaminophen (PERCOCET/ROXICET) 5-325 MG tablet Take 1 tablet by  mouth every 4 (four) hours as needed for severe pain. Patient not taking: Reported on 07/03/2016 06/08/16   Nadara MustardMarcus Duda V, MD  oxyCODONE-acetaminophen (PERCOCET/ROXICET) 5-325 MG tablet Take 1 tablet by mouth every 8 (eight) hours as needed for severe pain. 07/03/16   Nadara MustardMarcus Duda V, MD  oxyCODONE-acetaminophen (PERCOCET/ROXICET) 5-325 MG tablet Take 1 tablet by mouth every 8 (eight) hours as needed for severe pain. 07/31/16   Nadara MustardMarcus Duda V, MD  risperiDONE (RISPERDAL) 1 MG tablet Take 1 mg by mouth 2 (two) times daily. 12/23/15   Historical Provider, MD  SEROQUEL XR 150 MG 24 hr tablet Take 150 mg by mouth at bedtime. 12/23/15   Historical Provider, MD  tiZANidine (ZANAFLEX) 2 MG tablet Take 2 mg by mouth every 8 (eight) hours as needed. 06/02/16   Historical Provider, MD  trazodone (DESYREL) 300 MG tablet Take 300 mg by mouth at bedtime. 10/24/15   Historical Provider, MD  VIAGRA 100 MG tablet Take 100 mg by mouth daily as needed for erectile dysfunction.  09/30/15   Historical Provider, MD  zolpidem (AMBIEN) 10 MG tablet Take 10 mg by mouth at bedtime as needed for sleep.    Historical Provider, MD    Family History Family History  Problem Relation Age of Onset  . Heart Problems Mother   . Heart attack Father     age 740s    Social History Social History  Substance Use Topics  . Smoking status: Never Smoker  . Smokeless tobacco: Never Used  . Alcohol use 0.0 oz/week     Comment: rarely     Allergies   No known allergies   Review of Systems Review of Systems  Constitutional: Negative for diaphoresis and fever.  Eyes: Negative for redness.  Respiratory: Negative for cough and shortness of breath.   Cardiovascular: Positive for chest pain. Negative for palpitations and leg swelling.  Gastrointestinal: Negative for abdominal pain, nausea and vomiting.  Genitourinary: Negative for dysuria.  Musculoskeletal: Positive for myalgias. Negative for back pain and neck pain.  Skin: Negative for  rash.  Neurological: Negative for syncope and light-headedness.  Psychiatric/Behavioral: The patient is not nervous/anxious.      Physical Exam Updated Vital Signs BP (!) 158/129 (BP Location: Right Arm)   Pulse (!) 127   Temp 101 F (38.3 C) (Oral)   Resp 22   Ht 6' (1.829 m)   Wt 117.9 kg   SpO2 98% Comment: Simultaneous filing. User may not have seen previous data.  BMI 35.26 kg/m   Physical Exam  Constitutional: He appears well-developed and well-nourished.  HENT:  Head: Normocephalic and atraumatic.  Mouth/Throat: Mucous membranes are normal. Mucous membranes are not dry.  Eyes: Conjunctivae are normal.  Neck: Trachea normal and normal range of motion. Neck supple.  Normal carotid pulses and no JVD present. No muscular tenderness present. Carotid bruit is not present. No tracheal deviation present.  Cardiovascular: S1 normal, S2 normal, normal heart sounds and intact distal pulses.  An irregularly irregular rhythm present. Tachycardia present.  Exam reveals no distant heart sounds and no decreased pulses.   No murmur heard. Pulmonary/Chest: Effort normal and breath sounds normal. No respiratory distress. He has no wheezes. He exhibits no tenderness.  Abdominal: Soft. Normal aorta and bowel sounds are normal. There is no tenderness. There is no rebound and no guarding.  Musculoskeletal: He exhibits no edema.  Neurological: He is alert.  Skin: Skin is warm and dry. He is not diaphoretic. No cyanosis. No pallor.  Psychiatric: His mood appears anxious.  Nursing note and vitals reviewed.    ED Treatments / Results  Labs (all labs ordered are listed, but only abnormal results are displayed) Labs Reviewed  CBC - Abnormal; Notable for the following:       Result Value   HCT 38.6 (*)    MCV 70.1 (*)    MCH 23.6 (*)    All other components within normal limits  BASIC METABOLIC PANEL - Abnormal; Notable for the following:    CO2 21 (*)    Glucose, Bld 111 (*)     Creatinine, Ser 1.53 (*)    GFR calc non Af Amer 50 (*)    GFR calc Af Amer 57 (*)    All other components within normal limits  I-STAT TROPOININ, ED  I-STAT TROPOININ, ED    EKG  EKG Interpretation  Date/Time:  Tuesday August 07 2016 08:27:38 EST Ventricular Rate:  132 PR Interval:    QRS Duration: 139 QT Interval:  324 QTC Calculation: 481 R Axis:   -77 Text Interpretation:  Atrial fibrillation Ventricular premature complex Right bundle branch block Abnormal ekg Confirmed by Gerhard Munch  MD [phone]) on 08/07/2016 8:33:01 AM       Radiology Dg Chest 2 View  Result Date: 08/07/2016 CLINICAL DATA:  Chest pain, shortness of breath, atrial fibrillation with rapid ventricular response. EXAM: CHEST  2 VIEW COMPARISON:  None in PACs FINDINGS: The lungs are mildly hypoinflated. The lung markings are coarse in the left lower lobe. There is no pleural effusion or pneumothorax. The cardiac silhouette is mildly enlarged. There is calcification in the wall of the aortic arch. The trachea is midline. The bony thorax exhibits no acute abnormality. IMPRESSION: Coarse lung markings in the left lower lobe suggest atelectasis or pneumonia. Followup PA and lateral chest X-ray is recommended in 3-4 weeks following trial of antibiotic therapy to ensure resolution and exclude underlying malignancy. Thoracic aortic atherosclerosis. Electronically Signed   By: David  [place] M.D.   On: 08/07/2016 09:41    Procedures Procedures (including critical care time)  Medications Ordered in ED Medications  diltiazem (CARDIZEM) injection 10 mg (10 mg Intravenous Given 08/07/16 1026)  morphine 4 MG/ML injection 4 mg (4 mg Intravenous Given 08/07/16 1031)  ondansetron (ZOFRAN) injection 4 mg (4 mg Intravenous Given 08/07/16 1021)  LORazepam (ATIVAN) injection 0.5 mg (0.5 mg Intravenous Given 08/07/16 1245)     Initial Impression / Assessment and Plan / ED Course  I have reviewed the triage vital signs and the nursing  notes.  Pertinent labs & imaging results that were available during my care of the patient were reviewed by me and considered in my medical decision making (see chart for details).     Patient seen and examined.  Work-up initiated. Medications ordered. Cocaine induced CP, avoid beta blocker. CCB ordered to help control rate. Elevated rate likely 2/2 no metoprolol today + stimulant use. Do not suspect sepsis. Will treat pain.   Vital signs reviewed and are as follows: BP (!) 158/129 (BP Location: Right Arm)   Pulse (!) 127   Temp 101 F (38.3 C) (Oral)   Resp 22   Ht 6' (1.829 m)   Wt 117.9 kg   SpO2 98% Comment: Simultaneous filing. User may not have seen previous data.  BMI 35.26 kg/m   1:40 PM Pt updated on results. Trop neg x 2. EKG unchanged with slower rate. Questionable PNA on CXR.   Pt is concerned that whenever he gets up, he develops CP with sweating. He would prefer to stay for further evaluation.   Spoke with Dr. Jeraldine Loots who has seen previously. Spoke with Dr. Konrad Dolores who will obs overnight. Pt updated, agrees with plan.   Final Clinical Impressions(s) / ED Diagnoses   Final diagnoses:  Chest pain, unspecified type  Cocaine use  Chronic atrial fibrillation (HCC)   Admit.   New Prescriptions Current Discharge Medication List       Renne Crigler, PA-C 08/07/16 [address] PA-C 08/07/16 1344    Gerhard Munch, MD 08/07/16 [phone]

## 2016-08-07 NOTE — ED Notes (Signed)
Spoke with Neysa Bonitohristy, RN on [address] and notified her that patient's pain came back. He states it continues to come and go, VS remain stable, A-Fib on monitor. Pt denies SOB, dizziness, N/V, or radiation of pain. RN states okay to send patient up at this time.

## 2016-08-07 NOTE — Progress Notes (Signed)
*  PRELIMINARY RESULTS* Vascular Ultrasound Left lower extremity venous duplex has been completed.  Preliminary findings: No evidence of DVT or baker's cyst.    Farrel DemarkJill Eunice, RDMS, RVT  08/07/2016, 11:28 AM

## 2016-08-07 NOTE — ED Triage Notes (Signed)
Pt in from home with c/o chest pressure and sob starting at 0600 after "doing line of coke and argument with wife". Per pt, cp is L sided 8/10. Pt was given 324 ASA and 1 NTG, had no pain relief. Sats 98% on RA, NAD. Pt c/o LLE cramping as well, has hx of DVT's and takes Xarelto. A&ox4

## 2016-08-08 ENCOUNTER — Other Ambulatory Visit (INDEPENDENT_AMBULATORY_CARE_PROVIDER_SITE_OTHER): Payer: Self-pay | Admitting: Family

## 2016-08-08 ENCOUNTER — Observation Stay (HOSPITAL_BASED_OUTPATIENT_CLINIC_OR_DEPARTMENT_OTHER): Payer: Medicaid Other

## 2016-08-08 DIAGNOSIS — F1410 Cocaine abuse, uncomplicated: Secondary | ICD-10-CM

## 2016-08-08 DIAGNOSIS — N182 Chronic kidney disease, stage 2 (mild): Secondary | ICD-10-CM

## 2016-08-08 DIAGNOSIS — R079 Chest pain, unspecified: Secondary | ICD-10-CM

## 2016-08-08 DIAGNOSIS — I129 Hypertensive chronic kidney disease with stage 1 through stage 4 chronic kidney disease, or unspecified chronic kidney disease: Secondary | ICD-10-CM | POA: Diagnosis not present

## 2016-08-08 DIAGNOSIS — I10 Essential (primary) hypertension: Secondary | ICD-10-CM | POA: Diagnosis not present

## 2016-08-08 DIAGNOSIS — I482 Chronic atrial fibrillation: Secondary | ICD-10-CM

## 2016-08-08 DIAGNOSIS — N189 Chronic kidney disease, unspecified: Secondary | ICD-10-CM | POA: Diagnosis not present

## 2016-08-08 LAB — CBC
HCT: 33.7 % — ABNORMAL LOW (ref 39.0–52.0)
HEMOGLOBIN: 11.1 g/dL — AB (ref 13.0–17.0)
MCH: 23.2 pg — AB (ref 26.0–34.0)
MCHC: 32.9 g/dL (ref 30.0–36.0)
MCV: 70.4 fL — ABNORMAL LOW (ref 78.0–100.0)
PLATELETS: 119 10*3/uL — AB (ref 150–400)
RBC: 4.79 MIL/uL (ref 4.22–5.81)
RDW: 15.1 % (ref 11.5–15.5)
WBC: 4.9 10*3/uL (ref 4.0–10.5)

## 2016-08-08 LAB — BASIC METABOLIC PANEL
Anion gap: 8 (ref 5–15)
BUN: 10 mg/dL (ref 6–20)
CO2: 27 mmol/L (ref 22–32)
CREATININE: 1.29 mg/dL — AB (ref 0.61–1.24)
Calcium: 8.9 mg/dL (ref 8.9–10.3)
Chloride: 105 mmol/L (ref 101–111)
Glucose, Bld: 104 mg/dL — ABNORMAL HIGH (ref 65–99)
Potassium: 4.2 mmol/L (ref 3.5–5.1)
SODIUM: 140 mmol/L (ref 135–145)

## 2016-08-08 LAB — ECHOCARDIOGRAM COMPLETE
HEIGHTINCHES: 72 in
Weight: 4212.8 oz

## 2016-08-08 LAB — HIV ANTIBODY (ROUTINE TESTING W REFLEX): HIV SCREEN 4TH GENERATION: NONREACTIVE

## 2016-08-08 MED ORDER — DILTIAZEM HCL ER COATED BEADS 180 MG PO CP24: 180.0000 mg | ORAL_CAPSULE | Freq: Every day | ORAL | 0 refills | Status: DC

## 2016-08-08 MED ORDER — DILTIAZEM HCL ER COATED BEADS 240 MG PO CP24: 240.0000 mg | ORAL_CAPSULE | Freq: Every day | ORAL | Status: DC

## 2016-08-08 MED ORDER — PERFLUTREN LIPID MICROSPHERE
1.0000 mL | INTRAVENOUS | Status: AC | PRN
  Administered 2016-08-08: 2 mL via INTRAVENOUS
  Filled 2016-08-08: qty 10

## 2016-08-08 MED ORDER — DILTIAZEM HCL ER COATED BEADS 240 MG PO CP24: 240.0000 mg | ORAL_CAPSULE | Freq: Every day | ORAL | 0 refills | Status: DC

## 2016-08-08 NOTE — Consult Note (Signed)
CARDIOLOGY CONSULT NOTE   Patient ID: Garrett Ferrell MRN: [id number] DOB/AGE: 55-May-1962 55 y.o.  Admit date: 08/07/2016  Primary Physician   Jackie Plum, MD Primary Cardiologist   Dr. Allyson Sabal Reason for Consultation   CP Requesting Physician  Dr. Konrad Dolores  HPI: Garrett Ferrell is a 56 y.o. 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  He was recently establish with [place] on 11/18/2015. He never had heart attack or stroke himself, however has significant family history of CAD with his father died of MI in his 76s. His mother has a pacemaker. He says he was on Pradaxa before, and transitioned to eliquis. He was on a long trip in 2016, when he forgot to take his eliquis for 3 days, he ended up in the hospital with DVT and PE. Based on outside records from [facility] in [facility] [place], he had a left popliteal DVT at the time, V/Q skin read as high likelihood of PE, CTA confirmed extensive bilateral pulmonary emboli. He eventually underwent right heart catheterization with EKOS therapy on 03/24/2015 for saddle emboli. Left coronary was not injected.   Normal Myoview 04/2016 for pre operative clearance of right ankle surgery.  The patient was in usual state of health up until yesterday morning at 6 AM when patient had a midsternal sharp chest pain after using cocaine. Associated with shortness of breath. No radiation, no nausea, vomiting or diaphoresis. Pain intermittently persisted leading to further evaluation in a year. He also complains of left calf pain starting yesterday with walking. Compliant with his medication. He used cocaine after many years.  Upon presentation EKG shows A. fib at rate of 132 bpm. Patient was given IV Cardizem 10 mg x 1 with improvement of her  rate. Currently on by mouth Cardizem CD 180 mg. Home beta blocker discontinued in setting of cocaine abuse. Repeat EKG this morning shows A. fib at controlled rate with chronic RBBB. However, noted new T-wave inversion in lateral lead. Point-of-care troponin negative x 2. Troponin I negative x 3. Lower extremity Doppler negative for DVT. Chest x-ray clear.  He did not want any medical information shared in front of or giving to his wife.   Past Medical History:  Diagnosis Date  . Anxiety    takes Xanax daily as needed  . Arthritis   . Atrial fibrillation (HCC)    takes Eliquis daily  . Depression    takes Risperdal daily  . DVT (deep venous thrombosis) (HCC)    both legs in 2016  . Gout    takes Allopurinol daily  . Headache    occasionally  . History of shingles   . Hyperlipidemia   . Hyperlipidemia    takes Atorvastatin daily  . Hypertension    takes Metoprolol daily  . Insomnia    takes Ambien and Trazodone nightly  . Joint pain   . Joint swelling   . 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 Nocturia   . Seizures (HCC)    last one in 2002     Past Surgical History:  Procedure Laterality Date  . ANKLE ARTHROSCOPY Right 04/20/2016   Procedure: ANKLE ARTHROSCOPY;  Surgeon: Nadara Mustard, MD;  Location: [facility] OR;  Service: Orthopedics;  Laterality: Right;  . ANKLE FUSION Right 04/20/2016   Procedure: Right  Posterior Arthroscopic Subtalar Arthrodesis;  Surgeon: Nadara Mustard, MD;  Location: [facility] OR;  Service: Orthopedics;  Laterality: Right;  . ANKLE SURGERY Right    plates/screws  . HERNIA REPAIR     1986-umbilical  . RIGHT HEART CATH  2016   at [facility] during his DVT/PE    Allergies  Allergen Reactions  . No Known Allergies     I have reviewed the patient's current medications . allopurinol  100 mg Oral Daily  . apixaban  5 mg Oral BID  . aspirin EC  325 mg Oral Daily  . atorvastatin  10 mg Oral q1800  . diltiazem  180 mg Oral Daily  .  Influenza vac split quadrivalent PF  0.5 mL Intramuscular Tomorrow-1000  . meloxicam  15 mg Oral Daily  . QUEtiapine Fumarate  150 mg Oral QHS  . risperiDONE  1 mg Oral Daily  . trazodone  300 mg Oral QHS    acetaminophen **OR** acetaminophen, ALPRAZolam, gi cocktail, ondansetron **OR** ondansetron (ZOFRAN) IV, oxyCODONE-acetaminophen, tiZANidine  Prior to Admission medications   Medication Sig Start Date End Date Taking? Authorizing Provider  allopurinol (ZYLOPRIM) 100 MG tablet Take 100 mg by mouth daily. 12/22/15  Yes Historical Provider, MD  ALPRAZolam Prudy Feeler) 0.5 MG tablet Take 0.5 mg by mouth every 8 (eight) hours as needed. for anxiety 10/21/15  Yes Historical Provider, MD  atorvastatin (LIPITOR) 10 MG tablet Take 10 mg by mouth daily.   Yes Historical Provider, MD  ELIQUIS 5 MG TABS tablet Take 5 mg by mouth 2 (two) times daily. 11/03/15  Yes Historical Provider, MD  ferrous sulfate 325 (65 FE) MG EC tablet Take 325 mg by mouth daily with breakfast.   Yes Historical Provider, MD  meloxicam (MOBIC) 15 MG tablet Take 1 tablet (15 mg total) by mouth daily. 01/09/16  Yes Myra Rude, MD  metoprolol (LOPRESSOR) 50 MG tablet Take 1.5 tablets (75 mg total) by mouth 2 (two) times daily. 05/02/16  Yes Azalee Course, PA  oxyCODONE-acetaminophen (PERCOCET/ROXICET) 5-325 MG tablet Take 1 tablet by mouth every 8 (eight) hours as needed for severe pain. 07/31/16  Yes Nadara Mustard, MD  polyethylene glycol-electrolytes (GAVILYTE-N WITH FLAVOR PACK) 420 g solution Take 4,000 mLs by mouth once.   Yes Historical Provider, MD  risperiDONE (RISPERDAL) 1 MG tablet Take 1 mg by mouth daily.  12/23/15  Yes Historical Provider, MD  SEROQUEL XR 150 MG 24 hr tablet Take 150 mg by mouth at bedtime. 12/23/15  Yes Historical Provider, MD  tiZANidine (ZANAFLEX) 2 MG tablet Take 2-4 mg by mouth every 8 (eight) hours as needed for muscle spasms.  06/02/16  Yes Historical Provider, MD  trazodone (DESYREL) 300 MG tablet Take  300 mg by mouth at bedtime. 10/24/15  Yes Historical Provider, MD  VIAGRA 100 MG tablet Take 100 mg by mouth daily as needed for erectile dysfunction.  09/30/15  Yes Historical Provider, MD  zolpidem (AMBIEN) 10 MG tablet Take 10 mg by mouth at bedtime as needed for sleep.   Yes Historical Provider, MD     Social History   Social History  . Marital status: Married    Spouse name: [name]  . Number of children: 5  . Years of education: N/A   Occupational History  . disabled    Social History Main Topics  . Smoking status: Never Smoker  . Smokeless tobacco: Never Used  . Alcohol use 0.0 oz/week     Comment: rarely  .  Drug use: No  . Sexual activity: Not on file   Other Topics Concern  . Not on file   Social History Narrative  . No narrative on file    Family Status  Relation Status  . Mother Alive  . Father Deceased   Family History  Problem Relation Age of Onset  . Heart Problems Mother   . Heart attack Father     age 33s     ROS:  Full 14 point review of systems complete and found to be negative unless listed above.  Physical Exam: Blood pressure 118/84, pulse 78, temperature 97.7 F (36.5 C), temperature source Oral, resp. rate 17, height 6' (1.829 m), weight 263 lb 4.8 oz (119.4 kg), SpO2 95 %.  General: Well developed, well nourished, male in no acute distress Head: Eyes PERRLA, No xanthomas. Normocephalic and atraumatic, oropharynx without edema or exudate.  Lungs: Resp regular and unlabored, CTA. Heart: RRR no s3, s4, or murmurs..   Neck: No carotid bruits. No lymphadenopathy.  No JVD. Abdomen: Bowel sounds present, abdomen soft and non-tender without masses or hernias noted. Msk:  No spine or cva tenderness. No weakness, no joint deformities or effusions. Extremities: No clubbing, cyanosis or edema. DP/PT/Radials 2+ and equal bilaterally. Neuro: Alert and oriented X 3. No focal deficits noted. Psych:  Good affect, responds appropriately Skin: No rashes or  lesions noted.  Labs:   Lab Results  Component Value Date   WBC 4.9 08/08/2016   HGB 11.1 (L) 08/08/2016   HCT 33.7 (L) 08/08/2016   MCV 70.4 (L) 08/08/2016   PLT PENDING 08/08/2016   No results for input(s): INR in the last 72 hours.  Recent Labs Lab 08/08/16 0539  NA 140  K 4.2  CL 105  CO2 27  BUN 10  CREATININE 1.29*  CALCIUM 8.9  GLUCOSE 104*   No results found for: MG  Recent Labs  08/07/16 1715 08/07/16 1850 08/07/16 2131  TROPONINI <0.03 <0.03 <0.03    Recent Labs  08/07/16 0907 08/07/16 1220  TROPIPOC 0.00 0.00     Radiology:  Dg Chest 2 View  Result Date: 08/07/2016 CLINICAL DATA:  Chest pain, shortness of breath, atrial fibrillation with rapid ventricular response. EXAM: CHEST  2 VIEW COMPARISON:  None in PACs FINDINGS: The lungs are mildly hypoinflated. The lung markings are coarse in the left lower lobe. There is no pleural effusion or pneumothorax. The cardiac silhouette is mildly enlarged. There is calcification in the wall of the aortic arch. The trachea is midline. The bony thorax exhibits no acute abnormality. IMPRESSION: Coarse lung markings in the left lower lobe suggest atelectasis or pneumonia. Followup PA and lateral chest X-ray is recommended in 3-4 weeks following trial of antibiotic therapy to ensure resolution and exclude underlying malignancy. Thoracic aortic atherosclerosis. Electronically Signed   By: David  [place] M.D.   On: 08/07/2016 09:41    ASSESSMENT AND PLAN:     1. Chest pain - Likely coronary induced vasospasm in setting of cocaine abuse. Troponin negative so far. EKG with new T-wave changes in lateral leads. Avoid beta blocker. Continue statin.  2. Permanent atrial fibrillation - Rate elevated initially upon presentation. Well controlled on current Cardizem dose.CHADSVASC of 2 for HTN and vascular diease. Continue Eliquis for anticoagulation.  3. L calf pain - Started yesterday. Le doppler negative for DVT  4. HTN -  Well controlled currently.   5. Cocaine abuse - Advised cessation. Education given. Patient states that "will not use  again".  SignedManson Passey: Aedan Geimer, PA 08/08/2016, 7:07 AM Pager [phone]  Co-Sign MD

## 2016-08-08 NOTE — Discharge Summary (Addendum)
Physician Discharge Summary  Garrett Ferrell ZOX:[id number] DOB: 04/27/1961 DOA: 08/07/2016  PCP: Jackie PlumSEI-BONSU,GEORGE, MD  Admit date: 08/07/2016 Discharge date: 08/08/2016   Recommendations for Outpatient Follow-Up:   Cocaine cessation- bb changed to cardizem-- may need further titration Outpatient cardiology follow up  Discharge Diagnosis:   Active Problems:   Atrial fibrillation (HCC)   Essential hypertension   Hyperlipidemia   Chest pain   Cocaine use   Chronic musculoskeletal pain   CKD (chronic kidney disease)   Depression with anxiety   Discharge disposition:  Home. :  Discharge Condition: Improved.  Diet recommendation: Low sodium, heart healthy.  Wound care: None.   History of Present Illness:   Garrett CodeJerome Botelho is a 56 y.o. male with medical history significant of anxiety, atrial fibrillation, DVT, gout, headaches, hypertension, seizures presenting with exertional chest pain and left lower extremity pain. Patient states that chest pain started at approximately 6 AM on day of admission after patient took cocaine. Pain is left-sided and associated with shortness of breath. Worse with exertion. EMS was called and patient received aspirin and nitroglycerin in the field without significant relief. Patient also endorses onset of left lower extremity pain during this period of time patient states that it feels similar to his previous pain in his leg when he developed his DVT. Patient states compliance with his Xarelto. Leg pain does not radiate.  Patient states that he had a normal cardiac catheterization approximately 2 years ago living in [place].   Hospital Course by Problem:   CP: Likely from cocaine use in the setting of Afib and a h/o nml cardiac cath in 2016. Trop neg x2. EKG w/o ACS. CXR w/o infectious process.  - tele -echo normal -seen by cards-- no further work up  Afib: tachycardia likely from pain and stimulant use. No tru RVR. Improving w/ pain control and  single dose of dilt - continue Eliquis,  - Start Dilt (Stop Metop due to ongoing cocaine use)  Depression/Anxiety: - continue seroquel, xanax, risperdal  Chronic MSK pain: - continue oxycodone, Meloxicam, Zanaflex  Cocaine use: endorses use just prior to admission.  - UDS - PT DOES NOT WANT OTHER FAMILY MEMBERS TO KNOW THAT HE USED COCAINE   CKD: Cr 1.53. Near baseline  LLE pain: doppler negative for DVT. Suspect from cocaine use. Possible PAD. Pulses present and currently asymptomatic  Insomnia: - continue trazodone    Medical Consultants:    cards   Discharge Exam:   Vitals:   08/08/16 0631 08/08/16 1404  BP: 118/84 126/82  Pulse: 78 75  Resp: 17 16  Temp: 97.7 F (36.5 C) 97.9 F (36.6 C)   Vitals:   08/07/16 1846 08/07/16 2009 08/08/16 0631 08/08/16 1404  BP: (!) 119/103 125/87 118/84 126/82  Pulse:  92 78 75  Resp:  18 17 16   Temp:  98.7 F (37.1 C) 97.7 F (36.5 C) 97.9 F (36.6 C)  TempSrc:  Oral Oral Oral  SpO2:  99% 95% 98%  Weight:   119.4 kg (263 lb 4.8 oz)   Height:        Gen:  NAD    The results of significant diagnostics from this hospitalization (including imaging, microbiology, ancillary and laboratory) are listed below for reference.     Procedures and Diagnostic Studies:   Dg Chest 2 View  Result Date: 08/07/2016 CLINICAL DATA:  Chest pain, shortness of breath, atrial fibrillation with rapid ventricular response. EXAM: CHEST  2 VIEW COMPARISON:  None in PACs  FINDINGS: The lungs are mildly hypoinflated. The lung markings are coarse in the left lower lobe. There is no pleural effusion or pneumothorax. The cardiac silhouette is mildly enlarged. There is calcification in the wall of the aortic arch. The trachea is midline. The bony thorax exhibits no acute abnormality. IMPRESSION: Coarse lung markings in the left lower lobe suggest atelectasis or pneumonia. Followup PA and lateral chest X-ray is recommended in 3-4 weeks following  trial of antibiotic therapy to ensure resolution and exclude underlying malignancy. Thoracic aortic atherosclerosis. Electronically Signed   By: David  [place] M.D.   On: 08/07/2016 09:41     Labs:   Basic Metabolic Panel:  Recent Labs Lab 08/07/16 0904 08/08/16 0539  NA 141 140  K 4.7 4.2  CL 106 105  CO2 21* 27  GLUCOSE 111* 104*  BUN 17 10  CREATININE 1.53* 1.29*  CALCIUM 10.1 8.9   GFR Estimated Creatinine Clearance: 86.3 mL/min (by C-G formula based on SCr of 1.29 mg/dL (H)). Liver Function Tests: No results for input(s): AST, ALT, ALKPHOS, BILITOT, PROT, ALBUMIN in the last 168 hours. No results for input(s): LIPASE, AMYLASE in the last 168 hours. No results for input(s): AMMONIA in the last 168 hours. Coagulation profile No results for input(s): INR, PROTIME in the last 168 hours.  CBC:  Recent Labs Lab 08/07/16 0904 08/08/16 0539  WBC 9.6 4.9  HGB 13.0 11.1*  HCT 38.6* 33.7*  MCV 70.1* 70.4*  PLT 167 119*   Cardiac Enzymes:  Recent Labs Lab 08/07/16 1715 08/07/16 1850 08/07/16 2131  TROPONINI <0.03 <0.03 <0.03   BNP: Invalid input(s): POCBNP CBG: No results for input(s): GLUCAP in the last 168 hours. D-Dimer No results for input(s): DDIMER in the last 72 hours. Hgb A1c No results for input(s): HGBA1C in the last 72 hours. Lipid Profile No results for input(s): CHOL, HDL, LDLCALC, TRIG, CHOLHDL, LDLDIRECT in the last 72 hours. Thyroid function studies No results for input(s): TSH, T4TOTAL, T3FREE, THYROIDAB in the last 72 hours.  Invalid input(s): FREET3 Anemia work up No results for input(s): VITAMINB12, FOLATE, FERRITIN, TIBC, IRON, RETICCTPCT in the last 72 hours. Microbiology No results found for this or any previous visit (from the past 240 hour(s)).   Discharge Instructions:   Discharge Instructions    Diet - low sodium heart healthy    Complete by:  As directed    Increase activity slowly    Complete by:  As directed       Allergies as of 08/08/2016      Reactions   No Known Allergies       Medication List    STOP taking these medications   metoprolol 50 MG tablet Commonly known as:  LOPRESSOR     TAKE these medications   allopurinol 100 MG tablet Commonly known as:  ZYLOPRIM Take 100 mg by mouth daily.   ALPRAZolam 0.5 MG tablet Commonly known as:  XANAX Take 0.5 mg by mouth every 8 (eight) hours as needed. for anxiety   atorvastatin 10 MG tablet Commonly known as:  LIPITOR Take 10 mg by mouth daily.   diltiazem 240 MG 24 hr capsule Commonly known as:  CARDIZEM CD Take 1 capsule (240 mg total) by mouth daily. Start taking on:  08/09/2016   ELIQUIS 5 MG Tabs tablet Generic drug:  apixaban Take 5 mg by mouth 2 (two) times daily.   ferrous sulfate 325 (65 FE) MG EC tablet Take 325 mg by mouth daily with breakfast.  GAVILYTE-N WITH FLAVOR PACK 420 g solution Generic drug:  polyethylene glycol-electrolytes Take 4,000 mLs by mouth once.   meloxicam 15 MG tablet Commonly known as:  MOBIC Take 1 tablet (15 mg total) by mouth daily.   oxyCODONE-acetaminophen 5-325 MG tablet Commonly known as:  PERCOCET/ROXICET Take 1 tablet by mouth every 8 (eight) hours as needed for severe pain. What changed:  Another medication with the same name was removed. Continue taking this medication, and follow the directions you see here.   risperiDONE 1 MG tablet Commonly known as:  RISPERDAL Take 1 mg by mouth daily.   SEROQUEL XR 150 MG 24 hr tablet Generic drug:  QUEtiapine Fumarate Take 150 mg by mouth at bedtime.   tiZANidine 2 MG tablet Commonly known as:  ZANAFLEX Take 2-4 mg by mouth every 8 (eight) hours as needed for muscle spasms.   trazodone 300 MG tablet Commonly known as:  DESYREL Take 300 mg by mouth at bedtime.   VIAGRA 100 MG tablet Generic drug:  sildenafil Take 100 mg by mouth daily as needed for erectile dysfunction.   zolpidem 10 MG tablet Commonly known as:  AMBIEN Take  10 mg by mouth at bedtime as needed for sleep.      Follow-up Information    OSEI-BONSU,GEORGE, MD Follow up in 1 week(s).   Specialty:  Internal Medicine Contact information: [address] [place] [address] [phone]            Time coordinating discharge: 25 min  Signed:  Khiya Friese U Nao Linz   [facility] 08/08/2016, 3:00 PM

## 2016-08-08 NOTE — Progress Notes (Signed)
  Echocardiogram 2D Echocardiogram has been performed.  Tye SavoyCasey N Tanaya Dunigan 08/08/2016, 1:31 PM

## 2016-08-08 NOTE — Plan of Care (Signed)
Problem: Health Behavior/Discharge Planning: Goal: Ability to manage health-related needs will improve Outcome: Completed/Met Date Met: 08/08/16 Pt giving education on taking medications and not using cocaine. Pt will discharge home with wife.  Problem: Pain Managment: Goal: General experience of comfort will improve Outcome: Completed/Met Date Met: 08/08/16 Pt pain free. Is aware he has prn meds if needed

## 2016-08-08 NOTE — Progress Notes (Signed)
Notified Dr. Benjamine MolaVann that HR would go up to 130-160 with ambulation. Cont to monitor. Emelda Brothershristy Durga Saldarriaga RN

## 2016-08-08 NOTE — Plan of Care (Signed)
Problem: Education: Goal: Knowledge of [facility] Education information/materials will improve Outcome: Progressing Patient aware of plan of care.  RN and patient discussed all medications that were administered thus far this shift.  Patient has denied pain this shift.

## 2016-08-09 NOTE — Progress Notes (Signed)
08-07-16 triage note, cbc 08-07-16 and chest xray 3-6-70m faxed to [phone] per [facility] request abd she will make sure dr Dixon Boos sees these reports

## 2016-08-09 NOTE — Progress Notes (Signed)
Cbc results faxed to dr Danise Edgemartin johnson office by epic and left message on danielle voicemail

## 2016-08-09 NOTE — Progress Notes (Signed)
Chest xray results 08-07-16 and 08-07-16 progress note faxed to dr Laural Benesjohnson by epic. Message left for danyelle at dr Laural Benesjohnson office.

## 2016-08-10 ENCOUNTER — Other Ambulatory Visit (HOSPITAL_COMMUNITY): Payer: Self-pay | Admitting: *Deleted

## 2016-08-14 ENCOUNTER — Ambulatory Visit (HOSPITAL_COMMUNITY): Admit: 2016-08-14 | Payer: Medicaid Other | Admitting: Gastroenterology

## 2016-08-14 SURGERY — COLONOSCOPY WITH PROPOFOL
Anesthesia: Monitor Anesthesia Care

## 2016-08-15 ENCOUNTER — Other Ambulatory Visit (HOSPITAL_COMMUNITY): Payer: Self-pay | Admitting: *Deleted

## 2016-08-16 ENCOUNTER — Inpatient Hospital Stay (HOSPITAL_COMMUNITY): Admission: RE | Admit: 2016-08-16 | Payer: Self-pay | Source: Ambulatory Visit

## 2016-08-16 ENCOUNTER — Encounter (HOSPITAL_COMMUNITY): Payer: Self-pay

## 2016-08-16 HISTORY — DX: Anemia, unspecified: D64.9

## 2016-08-21 NOTE — Progress Notes (Signed)
Spoke with pt for pre-op call. Pt states he needs to cancel this surgery. I instructed pt (and gave pt phone #) to call Dr. Audrie Liauda's office now to cancel it. He voiced understanding.

## 2016-08-22 ENCOUNTER — Ambulatory Visit (HOSPITAL_COMMUNITY): Admission: RE | Admit: 2016-08-22 | Payer: Medicaid Other | Source: Ambulatory Visit | Admitting: Orthopedic Surgery

## 2016-08-22 ENCOUNTER — Encounter (HOSPITAL_COMMUNITY): Admission: RE | Payer: Self-pay | Source: Ambulatory Visit

## 2016-08-22 SURGERY — ANKLE ARTHROSCOPY WITH FUSION
Anesthesia: General | Laterality: Right

## 2016-08-28 ENCOUNTER — Ambulatory Visit: Payer: Medicaid Other | Admitting: Cardiovascular Disease

## 2016-08-28 ENCOUNTER — Encounter: Payer: Self-pay | Admitting: *Deleted

## 2016-08-28 ENCOUNTER — Other Ambulatory Visit (INDEPENDENT_AMBULATORY_CARE_PROVIDER_SITE_OTHER): Payer: Self-pay | Admitting: Orthopedic Surgery

## 2016-08-28 ENCOUNTER — Telehealth (INDEPENDENT_AMBULATORY_CARE_PROVIDER_SITE_OTHER): Payer: Self-pay

## 2016-08-28 MED ORDER — OXYCODONE-ACETAMINOPHEN 5-325 MG PO TABS: 1.0000 | ORAL_TABLET | Freq: Three times a day (TID) | ORAL | 0 refills | Status: DC | PRN

## 2016-08-28 NOTE — Telephone Encounter (Signed)
This pt was scheduled for an ankle fusion and this surgery was cancelled on 09/01/16. There is a request for a prior auth for pain medication but I do not see where you wrote rx recently. Do you wish to fill the rx for oxycodone for this pt? Please advise.

## 2016-08-28 NOTE — Telephone Encounter (Signed)
The pts pharmacy is requesting prior auth for oxycodone. Need to call [place] [facility] [phone].

## 2016-08-28 NOTE — Telephone Encounter (Signed)
Prescription written

## 2016-08-29 NOTE — Telephone Encounter (Signed)
auth submitted to [place] [facility] this morning and I will hold this message pending approval

## 2016-09-04 ENCOUNTER — Inpatient Hospital Stay (INDEPENDENT_AMBULATORY_CARE_PROVIDER_SITE_OTHER): Payer: Medicaid Other | Admitting: Orthopedic Surgery

## 2016-09-04 NOTE — Telephone Encounter (Signed)
Called [place] [facility] this morning and they state they did not receive the fax that was sent last week so this will be faxed again and will hold message pending auth.

## 2016-09-05 ENCOUNTER — Ambulatory Visit (INDEPENDENT_AMBULATORY_CARE_PROVIDER_SITE_OTHER): Payer: Medicaid Other | Admitting: Physician Assistant

## 2016-09-05 ENCOUNTER — Encounter: Payer: Self-pay | Admitting: Physician Assistant

## 2016-09-05 ENCOUNTER — Other Ambulatory Visit: Payer: Self-pay | Admitting: Physician Assistant

## 2016-09-05 VITALS — BP 106/81 | HR 72 | Ht 72.0 in | Wt 257.0 lb

## 2016-09-05 DIAGNOSIS — R0789 Other chest pain: Secondary | ICD-10-CM

## 2016-09-05 DIAGNOSIS — I10 Essential (primary) hypertension: Secondary | ICD-10-CM

## 2016-09-05 DIAGNOSIS — I482 Chronic atrial fibrillation, unspecified: Secondary | ICD-10-CM

## 2016-09-05 DIAGNOSIS — E785 Hyperlipidemia, unspecified: Secondary | ICD-10-CM | POA: Diagnosis not present

## 2016-09-05 MED ORDER — ELIQUIS 5 MG PO TABS: 5.0000 mg | ORAL_TABLET | Freq: Two times a day (BID) | ORAL | 11 refills | Status: DC

## 2016-09-05 NOTE — Progress Notes (Signed)
Cardiology Office Note    Date:  09/05/2016   ID:  Garrett Ferrell, DOB [birth year], MRN [id number]  PCP:  Jackie Plum, MD  Cardiologist:  Dr. Allyson Sabal   Chief Complaint  Patient presents with  . Follow-up    seen for Dr. Allyson Sabal, no chest pain     History of Present Illness:  Garrett Ferrell is a 56 y.o. 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 He was recently establish with [place] on 11/18/2015. He never had heart attack or stroke himself, however has significant family history of CAD with his father died of MI in his 31s. His mother has a pacemaker. He says he was on Pradaxa before, and transitioned to eliquis. He was on a long trip in 2016, when he forgot to take his eliquis for 3 days, he ended up in the hospital with DVT and PE. Based on outside records from [facility] in [facility] [place], he had a left popliteal DVT at the time, V/Q skin read as high likelihood of PE, CTA confirmed extensive bilateral pulmonary emboli. He eventually underwent right heart catheterization with EKOS therapy on 03/24/2015 for saddle emboli. Left coronary was not injected.  I last saw the patient in November after his surgery. I cleared him for surgery after a low risk Myoview on 04/05/2016. He was admitted in March 2018 with chest pain after using cocaine. Echocardiogram was repeated on 08/08/2016, this showed EF 60-65%, no regional wall motion abnormality. Given recent negative stress test, no further workup was felt to be necessary. Education was given to the patient with recommend for cessation from illicit drug.  He presents today for post hospital follow-up. I did not explicitly mention cocaine use as there were multiple hospital note that mentions his family was not aware of this and he is being accompanied by his partner today. His metoprolol tartrate was discontinued in  favor of diltiazem given drug use and potential interaction. He did take metoprolol today even though was discontinued, he will discontinue metoprolol starting tomorrow, I have instructed him to start on diltiazem CD 240 mg daily tomorrow. He will need to monitor his blood pressure and heart rate closely after starting on the new medication. Since discharge, he denies any chest discomfort or shortness of breath. He has been doing well from cardiology perspective. He does have potential upcoming ankle surgery per Dr. Lajoyce Corners, I have just recently cleared him and his chest pain associated with cocaine would not warrant additional workup. He is aware he will need to hold 5 doses of eliquis prior to surgery.    Past Medical History:  Diagnosis Date  . Anemia    takes Iron pill daily  . Anxiety    takes Xanax daily as needed  . Arthritis   . Atrial fibrillation (HCC)    takes Eliquis daily  . Depression    takes Risperdal daily  . DVT (deep venous thrombosis) (HCC)    both legs in 2016  . Gout    takes Allopurinol daily  . Headache    occasionally  . History of shingles   . Hyperlipidemia    takes Atorvastatin daily  . Hyperlipidemia    takes Atorvastatin daily  . Hypertension    takes Metoprolol daily  . Insomnia    takes Ambien and Trazodone nightly  . Joint pain   . Joint swelling   . 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 Nocturia   . Seizures (HCC)    last one in 2002    Past Surgical History:  Procedure Laterality Date  . ANKLE ARTHROSCOPY Right 04/20/2016   Procedure: ANKLE ARTHROSCOPY;  Surgeon: Nadara Mustard, MD;  Location: [facility] OR;  Service: Orthopedics;  Laterality: Right;  . ANKLE FUSION Right 04/20/2016   Procedure: Right Posterior Arthroscopic Subtalar Arthrodesis;  Surgeon: Nadara Mustard, MD;  Location: [facility] OR;  Service: Orthopedics;  Laterality: Right;  . ANKLE SURGERY Right    plates/screws  . HERNIA REPAIR     1986-umbilical  . RIGHT HEART CATH  2016   at  [facility] during his DVT/PE    Current Medications: Outpatient Medications Prior to Visit  Medication Sig Dispense Refill  . allopurinol (ZYLOPRIM) 100 MG tablet Take 100 mg by mouth daily.  5  . ALPRAZolam (XANAX) 0.5 MG tablet Take 0.5 mg by mouth every 8 (eight) hours as needed. for anxiety  0  . atorvastatin (LIPITOR) 10 MG tablet Take 10 mg by mouth daily.    [name] diltiazem (CARDIZEM CD) 240 MG 24 hr capsule Take 1 capsule (240 mg total) by mouth daily. 30 capsule 0  . ferrous sulfate 325 (65 FE) MG EC tablet Take 325 mg by mouth daily with breakfast.    . meloxicam (MOBIC) 15 MG tablet Take 1 tablet (15 mg total) by mouth daily. 30 tablet 1  . oxyCODONE-acetaminophen (PERCOCET/ROXICET) 5-325 MG tablet Take 1 tablet by mouth every 8 (eight) hours as needed for severe pain. 30 tablet 0  . polyethylene glycol-electrolytes (GAVILYTE-N WITH FLAVOR PACK) 420 g solution Take 4,000 mLs by mouth once.    . risperiDONE (RISPERDAL) 1 MG tablet Take 1 mg by mouth daily.   5  . SEROQUEL XR 150 MG 24 hr tablet Take 150 mg by mouth at bedtime.  1  . tiZANidine (ZANAFLEX) 2 MG tablet Take 2-4 mg by mouth every 8 (eight) hours as needed for muscle spasms.   1  . trazodone (DESYREL) 300 MG tablet Take 300 mg by mouth at bedtime.  5  . VIAGRA 100 MG tablet Take 100 mg by mouth daily as needed for erectile dysfunction.   2  . zolpidem (AMBIEN) 10 MG tablet Take 10 mg by mouth at bedtime as needed for sleep.    [name] ELIQUIS 5 MG TABS tablet Take 5 mg by mouth 2 (two) times daily.  3   No facility-administered medications prior to visit.      Allergies:   No known allergies   Social History   Social History  . Marital status: Married    Spouse name: [name]  . Number of children: 5  . Years of education: N/A   Occupational History  . disabled    Social History Main Topics  . Smoking status: Never Smoker  . Smokeless tobacco: Never Used  . Alcohol use 0.0 oz/week      Comment: rarely  . Drug use: No  . Sexual activity: Not Asked   Other Topics Concern  . None   Social History Narrative  . None     Family History:  The patient's family history includes Heart Problems in his mother; Heart attack in his father.   ROS:   Please see the history of present illness.    ROS All other systems reviewed and are negative.   PHYSICAL EXAM:   VS:  BP 106/81   Pulse 72  Ht 6' (1.829 m)   Wt 257 lb (116.6 kg)   BMI 34.86 kg/m    GEN: Well nourished, well developed, in no acute distress  HEENT: normal  Neck: no JVD, carotid bruits, or masses Cardiac: RRR; no murmurs, rubs, or gallops,no edema  Respiratory:  clear to auscultation bilaterally, normal work of breathing GI: soft, nontender, nondistended, + BS MS: no deformity or atrophy  Skin: warm and dry, no rash Neuro:  Alert and Oriented x 3, Strength and sensation are intact Psych: euthymic mood, full affect  Wt Readings from Last 3 Encounters:  09/05/16 257 lb (116.6 kg)  08/08/16 263 lb 4.8 oz (119.4 kg)  07/03/16 265 lb (120.2 kg)      Studies/Labs Reviewed:   EKG:  EKG is not ordered today.   Recent Labs: 08/08/2016: BUN 10; Creatinine, Ser 1.29; Hemoglobin 11.1; Platelets 119; Potassium 4.2; Sodium 140   Lipid Panel No results found for: CHOL, TRIG, HDL, CHOLHDL, VLDL, LDLCALC, LDLDIRECT  Additional studies/ records that were reviewed today include:    Myoview 04/05/2016 Study Highlights     There was no ST segment deviation noted during stress.  No T wave inversion was noted during stress.  Defect 1: There is a small defect of mild severity present in the basal inferior location.  The study is normal.  This is a low risk study.   Low risk stress nuclear study with mild fixed inferior defect, probably diaphragmatic attenuation artifact.  Non gated study.      Echo 08/08/2016 LV EF: 60% -   65%  - Left ventricle: The cavity size was normal. There was severe    concentric hypertrophy. Systolic function was normal. The   estimated ejection fraction was in the range of 60% to 65%. Wall   motion was normal; there were no regional wall motion   abnormalities. Left ventricular diastolic function parameters   were normal. - Left atrium: The atrium was moderately dilated.   ASSESSMENT:    1. Atypical chest pain   2. Chronic atrial fibrillation (HCC)   3. Essential hypertension   4. Hyperlipidemia, unspecified hyperlipidemia type      PLAN:  In order of problems listed above:  1. Atypical chest pain: Recent negative Myoview, chest pain occurred after cocaine use. He has been instructed in the hospital to avoid further cocaine use. His metoprolol has been discontinued and switched to diltiazem due to potentially uninhibited alpha adrenergic stimulation. No further workup is needed. He does still have upcoming ankle surgery, I cleared him in November, his cardiac condition has not changed since.  2. Chronic atrial fibrillation: On eliquis 5 mg twice a day. Also had preoperative history of DVT/PE. Despite the fact his metoprolol has been discontinued, he is still taking it at home, I have asked him to take another dose for tonight and switch to diltiazem CD 240 mg daily as instructed in the hospital. He will need to monitor his blood pressure and heart rate closely.  3. Hypertension: Blood pressure stable, switching from metoprolol to diltiazem CD 240 mg daily  4. Hyperlipidemia: On Lipitor 10 mg daily    Medication Adjustments/Labs and Tests Ordered: Current medicines are reviewed at length with the patient today.  Concerns regarding medicines are outlined above.  Medication changes, Labs and Tests ordered today are listed in the Patient Instructions below. Patient Instructions  Medication Instructions:  STOP METOPROLOL START DILTIAZEM 240 MG DAILY   If you need a refill on your cardiac medications  before your next appointment, please call your  pharmacy.  Follow-Up: Your physician wants you to follow-up in: 5-6 MONTHS WITH DR Allyson Sabal You will receive a reminder letter in the mail two months in advance. If you don't receive a letter, please call our office July 2018 to schedule the SEPT-OCTOBER follow-up appointment.   Special Instructions: CONTINUE TO MONITOR bp AND hr    Thank you for choosing [facility] at [facility]!!    Jeshawn Melucci, PA-C [place], LPN     Ramond Dial, [place]  09/05/2016 6:09 PM    [facility] [address] [place], [place], [place]  [address] Phone: [phone]; Fax: [phone]

## 2016-09-05 NOTE — Patient Instructions (Signed)
Medication Instructions:  STOP METOPROLOL START DILTIAZEM 240 MG DAILY   If you need a refill on your cardiac medications before your next appointment, please call your pharmacy.  Follow-Up: Your physician wants you to follow-up in: 5-6 MONTHS WITH DR Allyson Sabal You will receive a reminder letter in the mail two months in advance. If you don't receive a letter, please call our office July 2018 to schedule the SEPT-OCTOBER follow-up appointment.   Special Instructions: CONTINUE TO MONITOR bp AND hr    Thank you for choosing [facility] at [facility]    HAO MENG, PA-C [place], LPN

## 2016-09-06 NOTE — Telephone Encounter (Signed)
I called and medication was approved, called and lvm on pharmacy's phone prior auth no. [id number]

## 2016-09-10 ENCOUNTER — Telehealth: Payer: Self-pay | Admitting: Cardiovascular Disease

## 2016-09-10 NOTE — Telephone Encounter (Signed)
1. We can decrease cardizen dose to  instead to continue once daily dose ans re-assess BP response   OR  We can order diltiazem  to take 4 times per day.  Patient okay with 4x/day dose?

## 2016-09-10 NOTE — Telephone Encounter (Signed)
Pt of Dr. Allyson Sabal Recently seen by Wynema Birch Returned call to spouse, DPR  She expressed concern that patient has been running low BPs, having increased sleepiness, since starting on Diltiazem XR.  She is concerned bc conversation she had w pharmacist who filled med stated alcohol avoidance was critical due to potential for the medication to be released "all at once" rather than time released if alcohol in system. She's seeking recommendation, if OK to switch patient to a shorter acting form. BPs are borderline low, she does not endorse dizziness, lightheadedness, syncope/near syncope in patient. Aware I'll route for review and consideration of SE's.

## 2016-09-10 NOTE — Telephone Encounter (Signed)
New message   Pt wife is calling about his cardizem.   Pt c/o BP issue: STAT if pt c/o blurred vision, one-sided weakness or slurred speech  1. What are your last 5 BP readings? 90/60 88/58-per pt wife this is the range of what his bp was over the weekend.  2. Are you having any other symptoms (ex. Dizziness, headache, blurred vision, passed out)? Pt wife says pt has been more sleepy.  3. What is your BP issue? Pt wife is calling stating that over the weekend, pt bp was low. She said that his medication was just recently changed. Pt wife says that pharmacist told them when they picked up the medication, not to have alcohol with medication but pt wife is saying that pt does drink.

## 2016-09-11 NOTE — Telephone Encounter (Signed)
Number goes directly to VM, I have left msg for wife to return call so we can figure out what they would be amenable to for medication dosing.

## 2016-09-11 NOTE — Telephone Encounter (Signed)
Unable to reach when dialed, will reattempt.

## 2016-09-12 NOTE — Telephone Encounter (Signed)
Ok with either short acting or lower dose as suggested by our clinical pharmacist. Do not use beta blocker such as metoprolol (he was taken off of this).

## 2016-09-13 NOTE — Telephone Encounter (Signed)
msg left for wife to return call.

## 2016-09-14 ENCOUNTER — Other Ambulatory Visit: Payer: Self-pay | Admitting: Gastroenterology

## 2016-09-26 ENCOUNTER — Other Ambulatory Visit (INDEPENDENT_AMBULATORY_CARE_PROVIDER_SITE_OTHER): Payer: Self-pay | Admitting: Family

## 2016-10-03 ENCOUNTER — Encounter (HOSPITAL_COMMUNITY)
Admission: RE | Admit: 2016-10-03 | Discharge: 2016-10-03 | Disposition: A | Discharge status: Home / Self Care | Payer: Medicaid Other | Source: Ambulatory Visit | Attending: Orthopedic Surgery | Admitting: Orthopedic Surgery

## 2016-10-03 ENCOUNTER — Encounter (HOSPITAL_COMMUNITY): Payer: Self-pay

## 2016-10-03 ENCOUNTER — Ambulatory Visit (HOSPITAL_COMMUNITY)
Admission: RE | Admit: 2016-10-03 | Discharge: 2016-10-03 | Disposition: A | Discharge status: Home / Self Care | Payer: Medicaid Other | Source: Ambulatory Visit | Attending: Anesthesiology | Admitting: Anesthesiology

## 2016-10-03 DIAGNOSIS — Z01818 Encounter for other preprocedural examination: Secondary | ICD-10-CM | POA: Diagnosis not present

## 2016-10-03 DIAGNOSIS — Z01812 Encounter for preprocedural laboratory examination: Secondary | ICD-10-CM | POA: Insufficient documentation

## 2016-10-03 DIAGNOSIS — M12571 Traumatic arthropathy, right ankle and foot: Secondary | ICD-10-CM | POA: Diagnosis not present

## 2016-10-03 HISTORY — DX: Dyspnea, unspecified: R06.00

## 2016-10-03 LAB — SURGICAL PCR SCREEN
MRSA, PCR: NEGATIVE
Staphylococcus aureus: NEGATIVE

## 2016-10-03 LAB — CBC
HCT: 36.7 % — ABNORMAL LOW (ref 39.0–52.0)
HEMOGLOBIN: 12.2 g/dL — AB (ref 13.0–17.0)
MCH: 23.3 pg — ABNORMAL LOW (ref 26.0–34.0)
MCHC: 33.2 g/dL (ref 30.0–36.0)
MCV: 70 fL — ABNORMAL LOW (ref 78.0–100.0)
Platelets: 162 10*3/uL (ref 150–400)
RBC: 5.24 MIL/uL (ref 4.22–5.81)
RDW: 14.7 % (ref 11.5–15.5)
WBC: 4.6 10*3/uL (ref 4.0–10.5)

## 2016-10-03 LAB — BASIC METABOLIC PANEL
ANION GAP: 10 (ref 5–15)
BUN: 8 mg/dL (ref 6–20)
CALCIUM: 9.2 mg/dL (ref 8.9–10.3)
CO2: 22 mmol/L (ref 22–32)
Chloride: 107 mmol/L (ref 101–111)
Creatinine, Ser: 1.3 mg/dL — ABNORMAL HIGH (ref 0.61–1.24)
GFR calc Af Amer: >60 mL/min (ref >60)
GLUCOSE: 108 mg/dL — AB (ref 65–99)
Potassium: 3.8 mmol/L (ref 3.5–5.1)
SODIUM: 139 mmol/L (ref 135–145)

## 2016-10-03 LAB — NO BLOOD PRODUCTS

## 2016-10-03 MED ORDER — CHLORHEXIDINE GLUCONATE 4 % EX LIQD: 60.0000 mL | Freq: Once | CUTANEOUS | Status: DC

## 2016-10-03 NOTE — Progress Notes (Signed)
Cardiologist is Dr.Berry with last visit a few wks ago  Medical Md is Dr.George Ose9-Bonsu   Heart cath report in epic from 2016  Echo report in epic from 08/2016  Stress test report in epic from 04/2016  EKG in epic from 08-07-16

## 2016-10-03 NOTE — Progress Notes (Signed)
Cardiologist  Medical Md  Echo report in epic from 08/2016  Stress test report in epic from 04/2016  Heart cath report in epic from 03/2015  EKG in epic from 08-07-16  CXR

## 2016-10-03 NOTE — Progress Notes (Signed)
   10/03/16 1047  OBSTRUCTIVE SLEEP APNEA  Have you ever been diagnosed with sleep apnea through a sleep study? No (Tried to do sleep study but he couldnt go to sleep)  Do you snore loudly (loud enough to be heard through closed doors)?  1  Do you often feel tired, fatigued, or sleepy during the daytime (such as falling asleep during driving or talking to someone)? 0  Has anyone observed you stop breathing during your sleep? 0  Do you have, or are you being treated for high blood pressure? 1  BMI more than 35 kg/m2? 0  Age > 50 (1-yes) 1  Neck circumference greater than:Male 16 inches or larger, Male 17inches or larger? 1 (18.5)  Male Gender (Yes=1) 1  Obstructive Sleep Apnea Score 5  Score 5 or greater  Results sent to PCP

## 2016-10-04 NOTE — Progress Notes (Signed)
Anesthesia Chart Review: Patient is a 56 year old male scheduled for right ankle arthroscopic fusion on 10/05/2016 by Dr. Lajoyce Cornersuda.  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 Admission 08/07/16 for chest pain in the setting of cocaine use. Echo repeated, but given recent stress test no further ischemic testing recommended. BMI is consistent with obesity. OSA screening score was 5.   PCP is Dr. Greggory StallionGeorge Osei-Bonsu. Cardiologist is Dr. Nanetta BattyJonathan Berry. Last visit with Azalee CourseHao Meng, PA-C on 09/10/16. He was given permission to hold Eliquis for 5 days for surgery, although patient reported last dose 10/03/16.  Meds include allopurinol, Xanax, Lipitor, Cardizem CD, Eliquis, ferrous sulfate, losartan, Percocet, Risperdal, Seroquel XR, Zanaflex, trazodone, Viagra, Ambien. (Reported last dose 10/03/16.)  BP 116/71   Pulse 86   Temp 36.9 C   Resp 20   Ht 6' (1.829 m)   Wt 261 lb 11.2 oz (118.7 kg)   SpO2 99%   BMI 35.49 kg/m   EKG 08/08/16: Afib at 60 bpm, LAD, right bundle branch block, T-wave abnormality, consider lateral ischemia. He has had afib with intermittent anterolateral T wave abnormality when EKGs since 03/20/16 reviewed.   Echo 08/08/16: Study Conclusions - Left ventricle: The cavity size was normal. There was severe   concentric hypertrophy. Systolic function was normal. The   estimated ejection fraction was in the range of 60% to 65%. Wall   motion was normal; there were no regional wall motion   abnormalities. Left ventricular diastolic function parameters   were normal. - Left atrium: The atrium was moderately dilated.  Nuclear stress test 04/05/16:   There was no ST  segment deviation noted during stress.  No T wave inversion was noted during stress.  Defect 1: There is a small defect of mild severity present in the basal inferior location.  The study is normal.  This is a low risk study.  Low risk stress nuclear study with mild fixed inferior defect, probably diaphragmatic attenuation artifact.  Non gated study.  CXR 10/03/16: IMPRESSION: No edema or consolidation.  Preoperative labs noted. Glucose 108. Creatinine 1.30, stable when compared to 08/08/16 labs. HGB 12.2, hematocrit 36.7. Platelet count 162. He is not a Jehovah's Witness, but he did sign a refusal of blood products form indicating that he requested that only albumin or albumin containing products be administered if deemed necessary.  I no acute changes then I anticipate that he can proceed as planned. Chart received today for review. Eliquis being held for 48 hours. Definitve anesthesia plan per anesthesiologist (posted for GA). He does have a history of cocaine use (last in 08/2016). Will defer decision for UDS to anesthesiologist. (Cardiology notes indicate that his family is not aware of history of cocaine use.)  Velna Ochsllison Leontine Radman, PA-C [facility]Anesthesiology Phone [phone] 10/04/2016 10:28 AM

## 2016-10-05 ENCOUNTER — Ambulatory Visit (HOSPITAL_COMMUNITY)
Admission: RE | Admit: 2016-10-05 | Discharge: 2016-10-05 | Disposition: A | Discharge status: Home / Self Care | Payer: Medicaid Other | Source: Ambulatory Visit | Attending: Orthopedic Surgery | Admitting: Orthopedic Surgery

## 2016-10-05 ENCOUNTER — Ambulatory Visit (HOSPITAL_COMMUNITY): Payer: Medicaid Other | Admitting: Vascular Surgery

## 2016-10-05 ENCOUNTER — Ambulatory Visit (HOSPITAL_COMMUNITY): Payer: Medicaid Other | Admitting: Anesthesiology

## 2016-10-05 ENCOUNTER — Encounter (HOSPITAL_COMMUNITY)
Admission: RE | Disposition: A | Discharge status: Home / Self Care | Payer: Self-pay | Source: Ambulatory Visit | Attending: Orthopedic Surgery

## 2016-10-05 ENCOUNTER — Encounter (HOSPITAL_COMMUNITY): Payer: Self-pay

## 2016-10-05 DIAGNOSIS — F419 Anxiety disorder, unspecified: Secondary | ICD-10-CM | POA: Insufficient documentation

## 2016-10-05 DIAGNOSIS — Z9889 Other specified postprocedural states: Secondary | ICD-10-CM | POA: Insufficient documentation

## 2016-10-05 DIAGNOSIS — F329 Major depressive disorder, single episode, unspecified: Secondary | ICD-10-CM | POA: Diagnosis not present

## 2016-10-05 DIAGNOSIS — D649 Anemia, unspecified: Secondary | ICD-10-CM | POA: Diagnosis not present

## 2016-10-05 DIAGNOSIS — Z79899 Other long term (current) drug therapy: Secondary | ICD-10-CM | POA: Insufficient documentation

## 2016-10-05 DIAGNOSIS — E785 Hyperlipidemia, unspecified: Secondary | ICD-10-CM | POA: Diagnosis not present

## 2016-10-05 DIAGNOSIS — Z7901 Long term (current) use of anticoagulants: Secondary | ICD-10-CM | POA: Diagnosis not present

## 2016-10-05 DIAGNOSIS — Z86718 Personal history of other venous thrombosis and embolism: Secondary | ICD-10-CM | POA: Insufficient documentation

## 2016-10-05 DIAGNOSIS — Z791 Long term (current) use of non-steroidal anti-inflammatories (NSAID): Secondary | ICD-10-CM | POA: Diagnosis not present

## 2016-10-05 DIAGNOSIS — M109 Gout, unspecified: Secondary | ICD-10-CM | POA: Insufficient documentation

## 2016-10-05 DIAGNOSIS — I10 Essential (primary) hypertension: Secondary | ICD-10-CM | POA: Insufficient documentation

## 2016-10-05 DIAGNOSIS — G4700 Insomnia, unspecified: Secondary | ICD-10-CM | POA: Diagnosis not present

## 2016-10-05 DIAGNOSIS — Z8249 Family history of ischemic heart disease and other diseases of the circulatory system: Secondary | ICD-10-CM | POA: Insufficient documentation

## 2016-10-05 DIAGNOSIS — Z8619 Personal history of other infectious and parasitic diseases: Secondary | ICD-10-CM | POA: Diagnosis not present

## 2016-10-05 DIAGNOSIS — M12571 Traumatic arthropathy, right ankle and foot: Secondary | ICD-10-CM | POA: Diagnosis not present

## 2016-10-05 DIAGNOSIS — I4891 Unspecified atrial fibrillation: Secondary | ICD-10-CM | POA: Insufficient documentation

## 2016-10-05 HISTORY — PX: ANKLE ARTHROSCOPY WITH FUSION: SHX5581

## 2016-10-05 SURGERY — ANKLE ARTHROSCOPY WITH FUSION
Anesthesia: General | Laterality: Right

## 2016-10-05 MED ORDER — SODIUM CHLORIDE 0.9 % IR SOLN
Status: DC | PRN
  Administered 2016-10-05: 1000 mL

## 2016-10-05 MED ORDER — CEFAZOLIN SODIUM-DEXTROSE 2-4 GM/100ML-% IV SOLN
2.0000 g | INTRAVENOUS | Status: AC
  Administered 2016-10-05: 2 g via INTRAVENOUS
  Filled 2016-10-05: qty 100

## 2016-10-05 MED ORDER — LIDOCAINE HCL (CARDIAC) 20 MG/ML IV SOLN
INTRAVENOUS | Status: DC | PRN
  Administered 2016-10-05: 100 mg via INTRAVENOUS

## 2016-10-05 MED ORDER — FENTANYL CITRATE (PF) 100 MCG/2ML IJ SOLN
INTRAMUSCULAR | Status: AC
  Filled 2016-10-05: qty 2

## 2016-10-05 MED ORDER — FENTANYL CITRATE (PF) 250 MCG/5ML IJ SOLN
INTRAMUSCULAR | Status: AC
  Filled 2016-10-05: qty 5

## 2016-10-05 MED ORDER — ONDANSETRON HCL 4 MG/2ML IJ SOLN: 4.0000 mg | Freq: Once | INTRAMUSCULAR | Status: DC | PRN

## 2016-10-05 MED ORDER — OXYCODONE HCL 5 MG/5ML PO SOLN: 5.0000 mg | Freq: Once | ORAL | Status: DC | PRN

## 2016-10-05 MED ORDER — LACTATED RINGERS IV SOLN
INTRAVENOUS | Status: DC
  Administered 2016-10-05: 11:00:00 via INTRAVENOUS

## 2016-10-05 MED ORDER — STERILE WATER FOR IRRIGATION IR SOLN
Status: DC | PRN
  Administered 2016-10-05: 60 mL

## 2016-10-05 MED ORDER — PROPOFOL 10 MG/ML IV BOLUS
INTRAVENOUS | Status: DC | PRN
  Administered 2016-10-05: 70 mg via INTRAVENOUS

## 2016-10-05 MED ORDER — SODIUM CHLORIDE 0.9 % IR SOLN
Status: DC | PRN
  Administered 2016-10-05 (×2): 3000 mL

## 2016-10-05 MED ORDER — MIDAZOLAM HCL 2 MG/2ML IJ SOLN
INTRAMUSCULAR | Status: AC
  Filled 2016-10-05: qty 2

## 2016-10-05 MED ORDER — MIDAZOLAM HCL 5 MG/5ML IJ SOLN
INTRAMUSCULAR | Status: DC | PRN
  Administered 2016-10-05 (×2): 1 mg via INTRAVENOUS

## 2016-10-05 MED ORDER — OXYCODONE-ACETAMINOPHEN 5-325 MG PO TABS: 1.0000 | ORAL_TABLET | ORAL | 0 refills | Status: DC | PRN

## 2016-10-05 MED ORDER — FENTANYL CITRATE (PF) 100 MCG/2ML IJ SOLN
INTRAMUSCULAR | Status: DC | PRN
Start: 2016-10-05 — End: 2016-10-05
  Administered 2016-10-05: 100 ug via INTRAVENOUS
  Administered 2016-10-05 (×3): 50 ug via INTRAVENOUS

## 2016-10-05 MED ORDER — FENTANYL CITRATE (PF) 100 MCG/2ML IJ SOLN: 25.0000 ug | INTRAMUSCULAR | Status: DC | PRN

## 2016-10-05 MED ORDER — OXYCODONE HCL 5 MG PO TABS: 5.0000 mg | ORAL_TABLET | Freq: Once | ORAL | Status: DC | PRN

## 2016-10-05 SURGICAL SUPPLY — 56 items
BANDAGE ACE 4X5 VEL STRL LF (GAUZE/BANDAGES/DRESSINGS) ×3 IMPLANT
BLADE CUDA 5.5 (BLADE) IMPLANT
BLADE GREAT WHITE 4.2 (BLADE) ×2 IMPLANT
BLADE GREAT WHITE 4.2MM (BLADE) ×1
BNDG COHESIVE 6X5 TAN STRL LF (GAUZE/BANDAGES/DRESSINGS) ×3 IMPLANT
BNDG GAUZE ELAST 4 BULKY (GAUZE/BANDAGES/DRESSINGS) ×3 IMPLANT
BUR OVAL 6.0 (BURR) IMPLANT
BUR VERTEX HOODED 4.5 (BURR) ×3 IMPLANT
COVER SURGICAL LIGHT HANDLE (MISCELLANEOUS) ×3 IMPLANT
CUFF TOURNIQUET SINGLE 34IN LL (TOURNIQUET CUFF) IMPLANT
CUFF TOURNIQUET SINGLE 44IN (TOURNIQUET CUFF) IMPLANT
DRAPE ARTHROSCOPY W/POUCH 114 (DRAPES) ×3 IMPLANT
DRAPE OEC MINIVIEW 54X84 (DRAPES) ×3 IMPLANT
DRAPE U-SHAPE 47X51 STRL (DRAPES) ×3 IMPLANT
DRSG EMULSION OIL 3X3 NADH (GAUZE/BANDAGES/DRESSINGS) ×3 IMPLANT
DRSG PAD ABDOMINAL 8X10 ST (GAUZE/BANDAGES/DRESSINGS) IMPLANT
DURAPREP 26ML APPLICATOR (WOUND CARE) ×3 IMPLANT
ELECT REM PT RETURN 9FT ADLT (ELECTROSURGICAL) ×3
ELECTRODE REM PT RTRN 9FT ADLT (ELECTROSURGICAL) ×1 IMPLANT
GAUZE SPONGE 4X4 12PLY STRL (GAUZE/BANDAGES/DRESSINGS) ×3 IMPLANT
GLOVE BIOGEL PI IND STRL 6.5 (GLOVE) ×2 IMPLANT
GLOVE BIOGEL PI IND STRL 9 (GLOVE) ×1 IMPLANT
GLOVE BIOGEL PI INDICATOR 6.5 (GLOVE) ×4
GLOVE BIOGEL PI INDICATOR 9 (GLOVE) ×2
GLOVE SURG ORTHO 9.0 STRL STRW (GLOVE) ×3 IMPLANT
GLOVE SURG SS PI 6.5 STRL IVOR (GLOVE) ×6 IMPLANT
GLOVE SURG SS PI 8.0 STRL IVOR (GLOVE) ×6 IMPLANT
GOWN STRL REUS W/ TWL LRG LVL3 (GOWN DISPOSABLE) ×2 IMPLANT
GOWN STRL REUS W/ TWL XL LVL3 (GOWN DISPOSABLE) ×3 IMPLANT
GOWN STRL REUS W/TWL 2XL LVL3 (GOWN DISPOSABLE) ×3 IMPLANT
GOWN STRL REUS W/TWL LRG LVL3 (GOWN DISPOSABLE) ×4
GOWN STRL REUS W/TWL XL LVL3 (GOWN DISPOSABLE) ×6
GUIDEWIRE NONTHRD 300X2.8 (WIRE) ×6 IMPLANT
KIT BASIN OR (CUSTOM PROCEDURE TRAY) ×3 IMPLANT
KIT ROOM TURNOVER OR (KITS) ×3 IMPLANT
MANIFOLD NEPTUNE II (INSTRUMENTS) ×3 IMPLANT
NEEDLE 18GX1X1/2 (RX/OR ONLY) (NEEDLE) ×3 IMPLANT
PACK ARTHROSCOPY DSU (CUSTOM PROCEDURE TRAY) ×3 IMPLANT
PAD ARMBOARD 7.5X6 YLW CONV (MISCELLANEOUS) ×3 IMPLANT
PADDING CAST COTTON 6X4 STRL (CAST SUPPLIES) ×3 IMPLANT
PENCIL BUTTON HOLSTER BLD 10FT (ELECTRODE) ×3 IMPLANT
SCREW 6.5X60MM (Screw) ×3 IMPLANT
SCREW COMPRESSION HLESS 6.5X55 (Screw) ×3 IMPLANT
SET ARTHROSCOPY TUBING (MISCELLANEOUS) ×2
SET ARTHROSCOPY TUBING LN (MISCELLANEOUS) ×1 IMPLANT
SPONGE LAP 4X18 X RAY DECT (DISPOSABLE) ×3 IMPLANT
SUCTION FRAZIER HANDLE 10FR (MISCELLANEOUS)
SUCTION TUBE FRAZIER 10FR DISP (MISCELLANEOUS) IMPLANT
SUT ETHILON 2 0 PSLX (SUTURE) ×3 IMPLANT
SYR 20CC LL (SYRINGE) ×3 IMPLANT
TAPE STRIPS DRAPE STRL (GAUZE/BANDAGES/DRESSINGS) IMPLANT
TOWEL OR 17X24 6PK STRL BLUE (TOWEL DISPOSABLE) IMPLANT
TOWEL OR 17X26 10 PK STRL BLUE (TOWEL DISPOSABLE) ×3 IMPLANT
WAND STAR VAC 90 (SURGICAL WAND) ×6 IMPLANT
WATER STERILE IRR 1000ML POUR (IV SOLUTION) ×3 IMPLANT
WRAP KNEE MAXI GEL POST OP (GAUZE/BANDAGES/DRESSINGS) ×3 IMPLANT

## 2016-10-05 NOTE — H&P (Signed)
Garrett Ferrell is an 56 y.o. male.   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 Patient has undergone conservative therapy for the ankle presents at this time for arthroscopic fusion after failure of conservative care.  Past Medical History:  Diagnosis Date  . Anemia    takes Iron pill daily  . Anxiety    takes Xanax daily as needed  . Arthritis   . Atrial fibrillation ([place])    takes Eliquis daily. Last dose will be 10/03/16  . Depression    takes Risperdal daily  . DVT (deep venous thrombosis) ([place])    both legs in 2016  . Dyspnea    when laying on back  . Gout    takes Allopurinol daily  . Headache    occasionally  . History of shingles   . Hyperlipidemia    takes Atorvastatin daily  . Hyperlipidemia    takes Atorvastatin daily  . Hypertension    takes Metoprolol daily  . Insomnia    takes Ambien and Trazodone nightly  . Joint pain   . Joint swelling    right ankle  . 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 Nocturia   . Seizures ([place])    last one in 2002    Past Surgical History:  Procedure Laterality Date  . ANKLE ARTHROSCOPY Right 04/20/2016   Procedure: ANKLE ARTHROSCOPY;  Surgeon: [name], MD;  Location: [place];  Service: Orthopedics;  Laterality: Right;  . ANKLE FUSION Right 04/20/2016   Procedure: Right Posterior Arthroscopic Subtalar Arthrodesis;  Surgeon: [name], MD;  Location: [place];  Service: Orthopedics;  Laterality: Right;  . ANKLE SURGERY Right    plates/screws  . HERNIA REPAIR     1986-umbilical  . RIGHT HEART CATH  2016   at [facility] during his DVT/PE    Family History  Problem Relation Age of Onset  . Heart Problems Mother   . Heart attack Father     age 79s   Social History:  reports that he has never smoked. He has never used smokeless tobacco. He reports that he drinks alcohol. He reports that he  does not use drugs.  Allergies:  Allergies  Allergen Reactions  . No Known Allergies     No prescriptions prior to admission.    Results for orders placed or performed during the hospital encounter of 10/03/16 (from the past 48 hour(s))  Surgical pcr screen     Status: None   Collection Time: 10/03/16 10:51 AM  Result Value Ref Range   MRSA, PCR NEGATIVE NEGATIVE   Staphylococcus aureus NEGATIVE NEGATIVE    Comment:        The Xpert SA Assay (FDA approved for NASAL specimens in patients over 22 years of age), is one component of a comprehensive surveillance program.  Test performance has been validated by [facility] for patients greater than or equal to 31 year old. It is not intended to diagnose infection nor to guide or monitor treatment.   Basic metabolic panel     Status: Abnormal   Collection Time: 10/03/16 10:52 AM  Result Value Ref Range   Sodium 139 135 - 145 mmol/L   Potassium 3.8 3.5 - 5.1 mmol/L   Chloride 107 101 - 111 mmol/L   CO2 22 22 - 32 mmol/L   Glucose, Bld 108 (H) 65 - 99 mg/dL  BUN 8 6 - 20 mg/dL   Creatinine, Ser 1.30 (H) 0.61 - 1.24 mg/dL   Calcium 9.2 8.9 - 10.3 mg/dL   GFR calc non Af Amer >60 >60 mL/min   GFR calc Af Amer >60 >60 mL/min    Comment: (NOTE) The eGFR has been calculated using the CKD EPI equation. This calculation has not been validated in all clinical situations. eGFR's persistently <60 mL/min signify possible Chronic Kidney Disease.    Anion gap 10 5 - 15  CBC     Status: Abnormal   Collection Time: 10/03/16 10:52 AM  Result Value Ref Range   WBC 4.6 4.0 - 10.5 K/uL   RBC 5.24 4.22 - 5.81 MIL/uL   Hemoglobin 12.2 (L) 13.0 - 17.0 g/dL   HCT 36.7 (L) 39.0 - 52.0 %   MCV 70.0 (L) 78.0 - 100.0 fL   MCH 23.3 (L) 26.0 - 34.0 pg   MCHC 33.2 30.0 - 36.0 g/dL   RDW 14.7 11.5 - 15.5 %   Platelets 162 150 - 400 K/uL  No blood products     Status: None   Collection Time: 10/03/16 10:55 AM  Result Value Ref Range    Transfuse no blood products      TRANSFUSE NO BLOOD PRODUCTS, VERIFIED BY KRISTI MOORE,RN   Dg Chest 2 View  Result Date: 10/03/2016 CLINICAL DATA:  Preoperative evaluation for ankle surgery. History of atrial fibrillation and hypertension EXAM: CHEST  2 VIEW COMPARISON:  August 07, 2016. FINDINGS: The lungs are clear. The heart size and pulmonary vascularity are normal. No adenopathy. There is degenerative change in the thoracic spine. IMPRESSION: No edema or consolidation. Electronically Signed   By: Lowella Grip III M.D.   On: 10/03/2016 11:55    Review of Systems  All other systems reviewed and are negative.   There were no vitals taken for this visit. Physical Exam  On examination patient is alert oriented no adenopathy well-dressed normal affect normal wrist whenever he has an antalgic gait he has a good dorsalis pedis pulse  Shows traumatic arthritis involving the ankle. Assessment/Plan Assessment: Traumatic arthritis right ankle.  Plan: We'll plan for arthroscopic fusion risk and benefits were discussed including infection neurovascular injury persistent pain and need for additional surgery. Patient states he understands wishes to proceed at this time.  [name], MD 10/05/2016, 6:41 AM

## 2016-10-05 NOTE — Progress Notes (Signed)
Orthopedic Tech Progress Note Patient Details:  Lyndon CodeJerome Stanczak 12/20/1960 [id number]  Ortho Devices Type of Ortho Device: Crutches, CAM walker Ortho Device/Splint Location: provided pt with cam walker and crutches for pt right foot.  Pt tolerated well.  Pt ambulated very well with crutches.  Pt previously had cam walker and crutches.  wife at bedside.  Right Foot. Ortho Device/Splint Interventions: Application, Adjustment   Alvina ChouWilliams, [place] Jaquith C 10/05/2016, 3:09 PM

## 2016-10-05 NOTE — Anesthesia Procedure Notes (Addendum)
Anesthesia Regional Block: Adductor canal block   Pre-Anesthetic Checklist: ,, timeout performed, Correct Patient, Correct Site, Correct Laterality, Correct Procedure, Correct Position, site marked, Risks and benefits discussed,  Surgical consent,  Pre-op evaluation,  At surgeon's request and post-op pain management  Laterality: Right  Prep: chloraprep       Needles:  Injection technique: Single-shot      Additional Needles:   Procedures: ultrasound guided,,,,,,,,  Narrative:  Start time: 10/05/2016 12:45 PM End time: 10/05/2016 12:50 PM  Performed by: Personally   Additional Notes: 20 cc 0.5% Naropin injected easily

## 2016-10-05 NOTE — Anesthesia Procedure Notes (Signed)
Procedure Name: LMA Insertion Date/Time: 10/05/2016 1:04 PM Performed by: Coralee RudFLORES, Lloyd Ayo Pre-anesthesia Checklist: Patient identified, Emergency Drugs available, Suction available and Patient being monitored Patient Re-evaluated:Patient Re-evaluated prior to inductionOxygen Delivery Method: Circle system utilized Preoxygenation: Pre-oxygenation with 100% oxygen Intubation Type: IV induction Ventilation: Mask ventilation without difficulty LMA Size: 5.0 Number of attempts: 1 Placement Confirmation: positive ETCO2 Tube secured with: Tape

## 2016-10-05 NOTE — Anesthesia Postprocedure Evaluation (Addendum)
Anesthesia Post Note  Patient: Garrett Ferrell  Procedure(s) Performed: Procedure(s) (LRB): RIGHT ANKLE ARTHROSCOPIC FUSION (Right)  Patient location during evaluation: PACU Anesthesia Type: General Level of consciousness: awake, awake and alert and oriented Pain management: pain level controlled Vital Signs Assessment: post-procedure vital signs reviewed and stable Respiratory status: nonlabored ventilation, spontaneous breathing and respiratory function stable Cardiovascular status: blood pressure returned to baseline Anesthetic complications: no       Last Vitals:  Vitals:   10/05/16 1420 10/05/16 1500  BP: 105/82   Pulse: (!) 45   Resp: 11   Temp:  36.4 C    Last Pain:  Vitals:   10/05/16 1405  TempSrc:   PainSc: 0-No pain                 Axton Cihlar COKER

## 2016-10-05 NOTE — Transfer of Care (Signed)
Immediate Anesthesia Transfer of Care Note  Patient: Garrett Ferrell  Procedure(s) Performed: Procedure(s): RIGHT ANKLE ARTHROSCOPIC FUSION (Right)  Patient Location: PACU  Anesthesia Type:General and Regional  Level of Consciousness: awake, alert  and oriented  Airway & Oxygen Therapy: Patient Spontanous Breathing and Patient connected to nasal cannula oxygen  Post-op Assessment: Report given to RN, Post -op Vital signs reviewed and stable, Patient moving all extremities and Patient moving all extremities X 4  Post vital signs: Reviewed and stable  Last Vitals:  Vitals:   10/05/16 1054  BP: (!) 120/93  Pulse: 69  Resp: 18  Temp: 36.9 C    Last Pain:  Vitals:   10/05/16 1054  TempSrc: Oral         Complications: No apparent anesthesia complications

## 2016-10-05 NOTE — Anesthesia Preprocedure Evaluation (Signed)
Anesthesia Evaluation  Patient identified by MRN, date of birth, ID band Patient awake    Reviewed: Allergy & Precautions, NPO status , Patient's Chart, lab work & pertinent test results  Airway Mallampati: II  TM Distance: >3 FB     Dental  (+) Teeth Intact, Dental Advisory Given   Pulmonary    breath sounds clear to auscultation       Cardiovascular hypertension,  Rhythm:Regular Rate:Normal     Neuro/Psych    GI/Hepatic   Endo/Other    Renal/GU      Musculoskeletal   Abdominal   Peds  Hematology   Anesthesia Other Findings   Reproductive/Obstetrics                             Anesthesia Physical Anesthesia Plan  ASA: III  Anesthesia Plan: General and Regional   Post-op Pain Management:    Induction: Intravenous  Airway Management Planned: LMA  Additional Equipment:   Intra-op Plan:   Post-operative Plan:   Informed Consent: I have reviewed the patients History and Physical, chart, labs and discussed the procedure including the risks, benefits and alternatives for the proposed anesthesia with the patient or authorized representative who has indicated his/her understanding and acceptance.   Dental advisory given  Plan Discussed with:   Anesthesia Plan Comments:         Anesthesia Quick Evaluation

## 2016-10-05 NOTE — Anesthesia Procedure Notes (Signed)
Anesthesia Regional Block: Popliteal block   Pre-Anesthetic Checklist: ,, timeout performed, Correct Patient, Correct Site, Correct Laterality, Correct Procedure, Correct Position, site marked, Risks and benefits discussed,  Surgical consent,  Pre-op evaluation,  At surgeon's request and post-op pain management  Laterality: Right  Prep: chloraprep       Needles:   Needle Type: Echogenic Stimulator Needle          Additional Needles:   Procedures: ultrasound guided,,,,,,,,  Narrative:  Start time: 10/05/2016 12:40 PM End time: 10/05/2016 12:45 PM Injection made incrementally with aspirations every 5 mL.  Performed by: Personally  Anesthesiologist: Kellee Sittner  Additional Notes: 30 cc 0.5% Bupivacaine with 1:200 epi injected easily

## 2016-10-05 NOTE — Op Note (Signed)
10/05/2016  1:49 PM  PATIENT:  Garrett Ferrell    PRE-OPERATIVE DIAGNOSIS:  Traumatic Arthritis Right Ankle  POST-OPERATIVE DIAGNOSIS:  Same  PROCEDURE:  RIGHT ANKLE ARTHROSCOPIC FUSION  SURGEON:  Nadara MustardMarcus V Duda, MD  PHYSICIAN ASSISTANT:None ANESTHESIA:   General  PREOPERATIVE INDICATIONS:  Garrett Ferrell is a  56 y.o. male with a diagnosis of Traumatic Arthritis Right Ankle who failed conservative measures and elected for surgical management.    The risks benefits and alternatives were discussed with the patient preoperatively including but not limited to the risks of infection, bleeding, nerve injury, cardiopulmonary complications, the need for revision surgery, among others, and the patient was willing to proceed.  OPERATIVE IMPLANTS: 6.5 headless cannulated screws 2  OPERATIVE FINDINGS: Traumatic arthritis right ankle  OPERATIVE PROCEDURE: Patient brought the operating room and underwent a general anesthetic after a regional block with a popliteal and abductor block. After adequate levels anesthesia were obtained patient's right lower extremity was prepped using DuraPrep draped into a sterile field a timeout was called. Anterior medial anterolateral portals were established with blunt trochars were used and trimmed the capsule patient had massive scar tissue and patient underwent debridement of the anterior incision capsulitis. The joint was identified. A acromionizer blade was then used to debride the articular cartilage from the tibial talar joint a rasp was used to further debride the articular cartilage. After there was good bleeding bone over the articular surface of the joint was reduced at 90 neutral supination pronation and 2 K wires were inserted from the tibia medially and laterally crossing into the talus. C-arm fluoroscopy verified alignment. 6.5 headless screws were then placed. C-arm floss be verified alignment of the joint. The portals were closed using 2-0 nylon a sterile  compressive dressing was applied patient was extubated taken to PACU in stable condition plan for discharge to home.

## 2016-10-08 ENCOUNTER — Encounter (HOSPITAL_COMMUNITY): Payer: Self-pay | Admitting: Orthopedic Surgery

## 2016-10-22 ENCOUNTER — Ambulatory Visit (INDEPENDENT_AMBULATORY_CARE_PROVIDER_SITE_OTHER): Payer: Medicaid Other | Admitting: Orthopedic Surgery

## 2016-10-22 ENCOUNTER — Encounter (INDEPENDENT_AMBULATORY_CARE_PROVIDER_SITE_OTHER): Payer: Self-pay | Admitting: Orthopedic Surgery

## 2016-10-22 ENCOUNTER — Ambulatory Visit (INDEPENDENT_AMBULATORY_CARE_PROVIDER_SITE_OTHER): Payer: Medicaid Other

## 2016-10-22 VITALS — Ht 72.0 in | Wt 261.0 lb

## 2016-10-22 DIAGNOSIS — M25571 Pain in right ankle and joints of right foot: Secondary | ICD-10-CM | POA: Diagnosis not present

## 2016-10-22 DIAGNOSIS — M12571 Traumatic arthropathy, right ankle and foot: Secondary | ICD-10-CM

## 2016-10-22 MED ORDER — OXYCODONE-ACETAMINOPHEN 5-325 MG PO TABS: 1.0000 | ORAL_TABLET | Freq: Three times a day (TID) | ORAL | 0 refills | Status: DC | PRN

## 2016-10-22 NOTE — Progress Notes (Signed)
Office Visit Note   Patient: Garrett Ferrell           Date of Birth: 08/12/1960           MRN: [id number] Visit Date: 10/22/2016              Requested by: Jackie Plumsei-Bonsu, George, MD [address] [place], [place] [id number] PCP: Jackie Plumsei-Bonsu, George, MD  Chief Complaint  Patient presents with  . Right Ankle - Routine Post Op    10/05/16 right ankle arthroscopic fusion      HPI:  patient presents status post ankle fusion. The portals are clean and dry we'll harvest the sutures. His foot is plantar grade. No signs of infection he does complain of some burning pain from one of the medial portals. This may be due to the stitch. A refill prescription is provided.  Assessment & Plan: Visit Diagnoses:  1. Pain in right ankle and joints of right foot   2. Traumatic arthritis of right ankle     Plan:  Plan to continue nonweightbearing for 3 weeks follow-up at that time and repeat 2 view radiographs of the right ankle.  Follow-Up Instructions: Return in about 3 weeks (around 11/12/2016).   Ortho Exam  Patient is alert, oriented, no adenopathy, well-dressed, normal affect, normal respiratory effort.  examination the portals are clean and dry there is no redness no cellulitis there is minimal swelling no signs of infection.  Imaging: Xr Ankle Complete Right  Result Date: 10/22/2016  Two-view radiographs the right ankle shows stable fusion with good alignment no complicating features.   Labs: No results found for: HGBA1C, ESRSEDRATE, CRP, LABURIC, REPTSTATUS, GRAMSTAIN, CULT, LABORGA  Orders:  Orders Placed This Encounter  Procedures  . XR Ankle Complete Right   Meds ordered this encounter  Medications  . oxyCODONE-acetaminophen (PERCOCET/ROXICET) 5-325 MG tablet    Sig: Take 1 tablet by mouth every 8 (eight) hours as needed for severe pain.    Dispense:  30 tablet    Refill:  0     Procedures: No procedures performed  Clinical Data: No additional  findings.  ROS:  All other systems negative, except as noted in the HPI. Review of Systems  Objective: Vital Signs: Ht 6' (1.829 m)   Wt 261 lb (118.4 kg)   BMI 35.40 kg/m   Specialty Comments:  No specialty comments available.  PMFS History: Patient Active Problem List   Diagnosis Date Noted  . Chest pain 08/07/2016  . Cocaine use 08/07/2016  . Chronic musculoskeletal pain 08/07/2016  . CKD (chronic kidney disease) 08/07/2016  . Depression with anxiety 08/07/2016  . Traumatic arthritis of right ankle 07/31/2016  . 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 Osteoarthritis of right subtalar joint   . Right ankle pain 01/24/2016  . Bilateral hip pain 01/03/2016  . Atrial fibrillation ([facility]) 11/18/2015  . Essential hypertension 11/18/2015  . Hyperlipidemia 11/18/2015  . DVT, bilateral lower limbs ([facility]) 11/18/2015  . Pulmonary embolus ([facility]) 11/18/2015   Past Medical History:  Diagnosis Date  . Anemia    takes Iron pill daily  . Anxiety    takes Xanax daily as needed  . Arthritis   . Atrial fibrillation ([facility])    takes Eliquis daily. Last dose will be 10/03/16  . Depression    takes Risperdal daily  . DVT (deep venous thrombosis) ([facility])    both legs in 2016  . Dyspnea    when laying on back  . Gout  takes Allopurinol daily  . Headache    occasionally  . History of shingles   . Hyperlipidemia    takes Atorvastatin daily  . Hyperlipidemia    takes Atorvastatin daily  . Hypertension    takes Metoprolol daily  . Insomnia    takes Ambien and Trazodone nightly  . Joint pain   . Joint swelling    right ankle  . 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 Nocturia   . Seizures ([facility])    last one in 2002    Family History  Problem Relation Age of Onset  . Heart Problems Mother   . Heart attack Father        age 53s    Past Surgical History:  Procedure Laterality Date  . ANKLE ARTHROSCOPY Right 04/20/2016   Procedure: ANKLE ARTHROSCOPY;  Surgeon: Nadara Mustard, MD;  Location: [facility] OR;  Service: Orthopedics;  Laterality: Right;  . ANKLE ARTHROSCOPY WITH FUSION Right 10/05/2016   Procedure: RIGHT ANKLE ARTHROSCOPIC FUSION;  Surgeon: Nadara Mustard, MD;  Location: [facility] OR;  Service: Orthopedics;  Laterality: Right;  . ANKLE FUSION Right 04/20/2016   Procedure: Right Posterior Arthroscopic Subtalar Arthrodesis;  Surgeon: Nadara Mustard, MD;  Location: [facility] OR;  Service: Orthopedics;  Laterality: Right;  . ANKLE SURGERY Right    plates/screws  . HERNIA REPAIR     1986-umbilical  . RIGHT HEART CATH  2016   at [facility] during his DVT/PE   Social History   Occupational History  . disabled    Social History Main Topics  . Smoking status: Never Smoker  . Smokeless tobacco: Never Used  . Alcohol use 0.0 oz/week     Comment: one month ago- 09/2016  . Drug use: No  . Sexual activity: Not on file

## 2016-11-12 ENCOUNTER — Ambulatory Visit (INDEPENDENT_AMBULATORY_CARE_PROVIDER_SITE_OTHER): Payer: Medicaid Other | Admitting: Orthopedic Surgery

## 2016-11-12 ENCOUNTER — Ambulatory Visit (INDEPENDENT_AMBULATORY_CARE_PROVIDER_SITE_OTHER): Payer: Medicaid Other

## 2016-11-12 ENCOUNTER — Encounter (INDEPENDENT_AMBULATORY_CARE_PROVIDER_SITE_OTHER): Payer: Self-pay | Admitting: Orthopedic Surgery

## 2016-11-12 VITALS — Ht 72.0 in | Wt 261.0 lb

## 2016-11-12 DIAGNOSIS — M12571 Traumatic arthropathy, right ankle and foot: Secondary | ICD-10-CM

## 2016-11-12 DIAGNOSIS — M25374 Other instability, right foot: Secondary | ICD-10-CM

## 2016-11-12 MED ORDER — OXYCODONE-ACETAMINOPHEN 5-325 MG PO TABS
1.0000 | ORAL_TABLET | Freq: Three times a day (TID) | ORAL | 0 refills | Status: DC | PRN
Start: 2016-11-12 — End: 2016-12-10

## 2016-11-12 NOTE — Progress Notes (Signed)
Office Visit Note   Patient: Garrett Ferrell           Date of Birth: 02/06/1961           MRN: [id number] Visit Date: 11/12/2016              Requested by: Jackie Plumsei-Bonsu, George, MD [id number] [place], [place] [id number] PCP: Jackie Plumsei-Bonsu, George, MD  Chief Complaint  Patient presents with  . Right Ankle - Routine Post Op    10/05/16 Right Ankle Arthroscopic Fusion 38 days post op      HPI: Patient is 5 weeks status post right ankle arthroscopic fusion he is also status post subtalar fusion. Patient is currently on crutches fracture boot nonweightbearing.  Assessment & Plan: Visit Diagnoses:  1. Subtalar joint instability, right   2. Traumatic arthritis of right ankle     Plan: Patient will continue nonweightbearing for the right lower extremity refill prescription provided for Percocet. Follow-up in 4 weeks with repeat 3 view radiographs of the right ankle which time I anticipate we can allow him to weight-bear in the fracture boot.  Follow-Up Instructions: Return in about 4 weeks (around 12/10/2016).   Ortho Exam  Patient is alert, oriented, no adenopathy, well-dressed, normal affect, normal respiratory effort. Examination patient's foot is plantar grade there is no evidence of any cellulitis there is no swelling. The portals are clean and dry. No varus or valgus malalignment.  Imaging: Xr Ankle 2 Views Right  Result Date: 11/12/2016 Two-view radiographs of the right ankle show stable alignment of the talar and ankle fusion. There is no hardware failure no loss of reduction.   Labs: No results found for: HGBA1C, ESRSEDRATE, CRP, LABURIC, REPTSTATUS, GRAMSTAIN, CULT, LABORGA  Orders:  Orders Placed This Encounter  Procedures  . XR Ankle 2 Views Right   Meds ordered this encounter  Medications  . oxyCODONE-acetaminophen (ROXICET) 5-325 MG tablet    Sig: Take 1 tablet by mouth every 8 (eight) hours as needed for severe pain.    Dispense:  40 tablet    Refill:   0     Procedures: No procedures performed  Clinical Data: No additional findings.  ROS:  All other systems negative, except as noted in the HPI. Review of Systems  Objective: Vital Signs: Ht 6' (1.829 m)   Wt 261 lb (118.4 kg)   BMI 35.40 kg/m   Specialty Comments:  No specialty comments available.  PMFS History: Patient Active Problem List   Diagnosis Date Noted  . Chest pain 08/07/2016  . Cocaine use 08/07/2016  . Chronic musculoskeletal pain 08/07/2016  . CKD (chronic kidney disease) 08/07/2016  . Depression with anxiety 08/07/2016  . Traumatic arthritis of right ankle 07/31/2016  . 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 Osteoarthritis of right subtalar joint   . Right ankle pain 01/24/2016  . Bilateral hip pain 01/03/2016  . Atrial fibrillation (HCC) 11/18/2015  . Essential hypertension 11/18/2015  . Hyperlipidemia 11/18/2015  . DVT, bilateral lower limbs (HCC) 11/18/2015  . Pulmonary embolus (HCC) 11/18/2015   Past Medical History:  Diagnosis Date  . Anemia    takes Iron pill daily  . Anxiety    takes Xanax daily as needed  . Arthritis   . Atrial fibrillation (HCC)    takes Eliquis daily. Last dose will be 10/03/16  . Depression    takes Risperdal daily  . DVT (deep venous thrombosis) (HCC)    both legs in 2016  . Dyspnea  when laying on back  . Gout    takes Allopurinol daily  . Headache    occasionally  . History of shingles   . Hyperlipidemia    takes Atorvastatin daily  . Hyperlipidemia    takes Atorvastatin daily  . Hypertension    takes Metoprolol daily  . Insomnia    takes Ambien and Trazodone nightly  . Joint pain   . Joint swelling    right ankle  . 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 Nocturia   . Seizures (HCC)    last one in 2002    Family History  Problem Relation Age of Onset  . Heart Problems Mother   . Heart attack Father        age 105s    Past Surgical History:  Procedure Laterality Date  . ANKLE  ARTHROSCOPY Right 04/20/2016   Procedure: ANKLE ARTHROSCOPY;  Surgeon: Nadara Mustard, MD;  Location: [facility] OR;  Service: Orthopedics;  Laterality: Right;  . ANKLE ARTHROSCOPY WITH FUSION Right 10/05/2016   Procedure: RIGHT ANKLE ARTHROSCOPIC FUSION;  Surgeon: Nadara Mustard, MD;  Location: [facility] OR;  Service: Orthopedics;  Laterality: Right;  . ANKLE FUSION Right 04/20/2016   Procedure: Right Posterior Arthroscopic Subtalar Arthrodesis;  Surgeon: Nadara Mustard, MD;  Location: [facility] OR;  Service: Orthopedics;  Laterality: Right;  . ANKLE SURGERY Right    plates/screws  . HERNIA REPAIR     1986-umbilical  . RIGHT HEART CATH  2016   at [facility] during his DVT/PE   Social History   Occupational History  . disabled    Social History Main Topics  . Smoking status: Never Smoker  . Smokeless tobacco: Never Used  . Alcohol use 0.0 oz/week     Comment: one month ago- 09/2016  . Drug use: No  . Sexual activity: Not on file

## 2016-11-23 ENCOUNTER — Encounter (HOSPITAL_COMMUNITY): Payer: Self-pay | Admitting: *Deleted

## 2016-11-27 ENCOUNTER — Ambulatory Visit (HOSPITAL_COMMUNITY): Payer: Medicaid Other | Admitting: Anesthesiology

## 2016-11-27 ENCOUNTER — Ambulatory Visit (HOSPITAL_COMMUNITY)
Admission: RE | Admit: 2016-11-27 | Discharge: 2016-11-27 | Disposition: A | Discharge status: Home / Self Care | Payer: Medicaid Other | Source: Ambulatory Visit | Attending: Gastroenterology | Admitting: Gastroenterology

## 2016-11-27 ENCOUNTER — Encounter (HOSPITAL_COMMUNITY)
Admission: RE | Disposition: A | Discharge status: Home / Self Care | Payer: Self-pay | Source: Ambulatory Visit | Attending: Gastroenterology

## 2016-11-27 ENCOUNTER — Encounter (HOSPITAL_COMMUNITY): Payer: Self-pay | Admitting: Anesthesiology

## 2016-11-27 DIAGNOSIS — F419 Anxiety disorder, unspecified: Secondary | ICD-10-CM | POA: Diagnosis not present

## 2016-11-27 DIAGNOSIS — F329 Major depressive disorder, single episode, unspecified: Secondary | ICD-10-CM | POA: Diagnosis not present

## 2016-11-27 DIAGNOSIS — Z1211 Encounter for screening for malignant neoplasm of colon: Secondary | ICD-10-CM | POA: Insufficient documentation

## 2016-11-27 DIAGNOSIS — Z86711 Personal history of pulmonary embolism: Secondary | ICD-10-CM | POA: Diagnosis not present

## 2016-11-27 DIAGNOSIS — Z86718 Personal history of other venous thrombosis and embolism: Secondary | ICD-10-CM | POA: Insufficient documentation

## 2016-11-27 DIAGNOSIS — E7800 Pure hypercholesterolemia, unspecified: Secondary | ICD-10-CM | POA: Diagnosis not present

## 2016-11-27 DIAGNOSIS — D649 Anemia, unspecified: Secondary | ICD-10-CM | POA: Diagnosis not present

## 2016-11-27 DIAGNOSIS — M109 Gout, unspecified: Secondary | ICD-10-CM | POA: Insufficient documentation

## 2016-11-27 DIAGNOSIS — Z79891 Long term (current) use of opiate analgesic: Secondary | ICD-10-CM | POA: Insufficient documentation

## 2016-11-27 DIAGNOSIS — I10 Essential (primary) hypertension: Secondary | ICD-10-CM | POA: Diagnosis not present

## 2016-11-27 DIAGNOSIS — I4891 Unspecified atrial fibrillation: Secondary | ICD-10-CM | POA: Insufficient documentation

## 2016-11-27 DIAGNOSIS — Z79899 Other long term (current) drug therapy: Secondary | ICD-10-CM | POA: Insufficient documentation

## 2016-11-27 DIAGNOSIS — Z7901 Long term (current) use of anticoagulants: Secondary | ICD-10-CM | POA: Diagnosis not present

## 2016-11-27 HISTORY — PX: COLONOSCOPY WITH PROPOFOL: SHX5780

## 2016-11-27 SURGERY — COLONOSCOPY WITH PROPOFOL
Anesthesia: Monitor Anesthesia Care

## 2016-11-27 MED ORDER — FENTANYL CITRATE (PF) 100 MCG/2ML IJ SOLN
INTRAMUSCULAR | Status: DC | PRN
  Administered 2016-11-27 (×2): 50 ug via INTRAVENOUS

## 2016-11-27 MED ORDER — FENTANYL CITRATE (PF) 100 MCG/2ML IJ SOLN
INTRAMUSCULAR | Status: AC
  Filled 2016-11-27: qty 2

## 2016-11-27 MED ORDER — PHENYLEPHRINE 40 MCG/ML (10ML) SYRINGE FOR IV PUSH (FOR BLOOD PRESSURE SUPPORT)
PREFILLED_SYRINGE | INTRAVENOUS | Status: DC | PRN
  Administered 2016-11-27: 80 ug via INTRAVENOUS

## 2016-11-27 MED ORDER — SODIUM CHLORIDE 0.9 % IV SOLN
INTRAVENOUS | Status: DC
  Administered 2016-11-27: 500 mL via INTRAVENOUS

## 2016-11-27 MED ORDER — PROPOFOL 10 MG/ML IV BOLUS
INTRAVENOUS | Status: DC | PRN
  Administered 2016-11-27 (×3): 20 mg via INTRAVENOUS

## 2016-11-27 MED ORDER — PROPOFOL 10 MG/ML IV BOLUS
INTRAVENOUS | Status: AC
  Filled 2016-11-27: qty 40

## 2016-11-27 MED ORDER — PROPOFOL 500 MG/50ML IV EMUL
INTRAVENOUS | Status: DC | PRN
  Administered 2016-11-27: 140 ug/kg/min via INTRAVENOUS

## 2016-11-27 MED ORDER — PHENYLEPHRINE 40 MCG/ML (10ML) SYRINGE FOR IV PUSH (FOR BLOOD PRESSURE SUPPORT)
PREFILLED_SYRINGE | INTRAVENOUS | Status: AC
  Filled 2016-11-27: qty 10

## 2016-11-27 SURGICAL SUPPLY — 21 items

## 2016-11-27 NOTE — Transfer of Care (Signed)
Immediate Anesthesia Transfer of Care Note  Patient: Garrett Ferrell  Procedure(s) Performed: Procedure(s): COLONOSCOPY WITH PROPOFOL (N/A)  Patient Location: Endoscopy Unit  Anesthesia Type:MAC  Level of Consciousness: awake  Airway & Oxygen Therapy: Patient Spontanous Breathing and Patient connected to face mask oxygen  Post-op Assessment: Report given to RN and Post -op Vital signs reviewed and stable  Post vital signs: Reviewed and stable  Last Vitals:  Vitals:   11/27/16 0850  BP: (!) 161/81  Pulse: 98  Resp: 14  Temp: 36.6 C    Last Pain:  Vitals:   11/27/16 0916  TempSrc:   PainSc: 8          Complications: No apparent anesthesia complications

## 2016-11-27 NOTE — Anesthesia Preprocedure Evaluation (Signed)
Anesthesia Evaluation  Patient identified by MRN, date of birth, ID band Patient awake    Reviewed: Allergy & Precautions, NPO status , Patient's Chart, lab work & pertinent test results  Airway Mallampati: I       Dental no notable dental hx. (+) Teeth Intact   Pulmonary    Pulmonary exam normal breath sounds clear to auscultation       Cardiovascular hypertension, Pt. on medications Normal cardiovascular exam Rhythm:Regular Rate:Normal     Neuro/Psych PSYCHIATRIC DISORDERS Anxiety Depression    GI/Hepatic   Endo/Other    Renal/GU Renal InsufficiencyRenal disease     Musculoskeletal   Abdominal Normal abdominal exam  (+)   Peds  Hematology  (+) Blood dyscrasia, anemia ,   Anesthesia Other Findings Ordering physician: Geradine Girt, DO Study date: 08/08/16 Study Result   Result status: Final result                             *[facility]*                         [address], [place] [address]                            [phone]  ------------------------------------------------------------------- Transthoracic Echocardiography  Patient:    Garrett Ferrell MR #:       [id number] Study Date: 08/08/2016 Gender:     M Age:        56 Height:     182.9 cm Weight:     119.4 kg BSA:        2.5 m^2 Pt. Status: Room:       [place], [place]  ADMITTING    Marily Memos, Alveda Reasons, Tomi Bamberger  REFERRING    Geradine Girt  PERFORMING   [facility], Inpatient  SONOGRAPHER  Mikki Santee  cc:  ------------------------------------------------------------------- LV EF: 60% -   65%  ------------------------------------------------------------------- Indications:      Chest pain 786.51.  ------------------------------------------------------------------- History:   PMH:   Atrial  fibrillation.  Risk factors:  Cocaine abuse. Hypertension. Dyslipidemia.  ------------------------------------------------------------------- Study Conclusions  - Left ventricle: The cavity size was normal. There was severe   concentric hypertrophy. Systolic function was normal. The   estimated ejection fraction was in the range of 60% to 65%. Wall   motion was normal; there were no regional wall motion   abnormalities. Left ventricular diastolic function parameters   were normal. - Left atrium: The atrium was moderately dilated.  ------------------------------------------------------------------- Study data:  No prior study was available for comparison.  Study status:  Routine.  Procedure:  The patient reported no pain pre or post test. Transthoracic echocardiography. The study was technically difficult, as a result of poor acoustic windows and poor sound wave transmission. Intravenous contrast (Definity) was administered.  Study completion:  There were no complications.     Transthoracic echocardiography.  M-mode, complete 2D, spectral Doppler, and color Doppler.  Birthdate:  Patient birthdate: April 16, 1961.  Age:  Patient is 56 yr  old.  Sex:  Gender: male. BMI: 35.7 kg/m^2.  Blood pressure:     118/84  Patient status: Inpatient.  Study date:  Study date: 08/08/2016. Study time: 12:35 PM.  Location:  Echo laboratory.  -------------------------------------------------------------------  ------------------------------------------------------------------- Left ventricle:  The cavity size was normal. There was severe concentric hypertrophy. Systolic function was normal. The estimated ejection fraction was in the range of 60% to 65%. Wall motion was normal; there were no regional wall motion abnormalities. The transmitral flow pattern was normal. The deceleration time of the early transmitral flow velocity was normal. The pulmonary vein flow pattern was normal. The tissue Doppler  parameters were normal. Left ventricular diastolic function parameters were normal.  ------------------------------------------------------------------- Aortic valve:   Trileaflet; normal thickness leaflets. Mobility was not restricted.  Doppler:  Transvalvular velocity was within the normal range. There was no stenosis. There was no regurgitation.   ------------------------------------------------------------------- Aorta:  The aorta was normal, not dilated, and non-diseased. Aortic root: The aortic root was normal in size.  ------------------------------------------------------------------- Mitral valve:   Structurally normal valve.   Mobility was not restricted.  Doppler:  Transvalvular velocity was within the normal range. There was no evidence for stenosis. There was no regurgitation.    Peak gradient (D): 2 mm Hg.  ------------------------------------------------------------------- Left atrium:  The atrium was moderately dilated.  ------------------------------------------------------------------- Atrial septum:  Poorly visualized.  ------------------------------------------------------------------- Right ventricle:  The cavity size was normal. Wall thickness was normal. Systolic function was normal.  ------------------------------------------------------------------- Pulmonic valve:    Doppler:  Transvalvular velocity was within the normal range. There was no evidence for stenosis. There was trivial regurgitation.  ------------------------------------------------------------------- Tricuspid valve:   Structurally normal valve.    Doppler: Transvalvular velocity was within the normal range. There was mild regurgitation.  ------------------------------------------------------------------- Pulmonary artery:   The main pulmonary artery was normal-sized. Systolic pressure was within the normal  range.  ------------------------------------------------------------------- Right atrium:  The atrium was normal in size.  ------------------------------------------------------------------- Pericardium:  The pericardium was normal in appearance. There was no pericardial effusion.  ------------------------------------------------------------------- Systemic veins: Inferior vena cava: The vessel was normal in size. The respirophasic diameter changes were in the normal range (>= 50%), consistent with normal central venous pressure.  ------------------------------------------------------------------- Post procedure conclusions Ascending Aorta:  - The aorta was normal, not dilated, and non-diseased.  ------------------------------------------------------------------- Measurements   Left ventricle                           Value        Reference  LV ID, ED, PLAX chordal                  45.5  mm     43 - 52  LV ID, ES, PLAX chordal                  25.2  mm     23 - 38  LV fx shortening, PLAX chordal           45    %      >=29  LV PW thickness, ED                      13.6  mm     ----------  IVS/LV PW ratio, ED                      1.28         <=1.3  LV e&', medial                            7.54  cm/s   ----------  LV E/e&', medial                          9.59         ----------    Ventricular septum                       Value        Reference  IVS thickness, ED                        17.4  mm     ----------    LVOT                                     Value        Reference  LVOT ID, S                               26    mm     ----------  LVOT area                                5.31  cm^2   ----------    Aorta                                    Value        Reference  Aortic root ID, ED                       33    mm     ----------    Left atrium                              Value        Reference  LA ID, A-P, ES                           48    mm      ----------  LA ID/bsa, A-P                           1.92  cm/m^2 <=2.2  LA volume, S                             62.8  ml     ----------  LA volume/bsa, S                         25.1  ml/m^2 ----------  LA volume, ES, 1-p A4C                   67.2  ml     ----------  LA volume/bsa, ES, 1-p A4C  26.8  ml/m^2 ----------  LA volume, ES, 1-p A2C                   58.1  ml     ----------  LA volume/bsa, ES, 1-p A2C               23.2  ml/m^2 ----------    Mitral valve                             Value        Reference  Mitral E-wave peak velocity              72.3  cm/s   ----------  Mitral deceleration time                 194   ms     150 - 230  Mitral peak gradient, D                  2     mm Hg  ----------    Right atrium                             Value        Reference  RA ID, S-I, ES, A4C            (H)       52.1  mm     34 - 49  RA area, ES, A4C                         11.2  cm^2   8.3 - 19.5  RA volume, ES, A/L                       20.2  ml     ----------  RA volume/bsa, ES, A/L                   8.1   ml/m^2 ----------  Legend: (L)  and  (H)  mark values outside specified reference range.  ------------------------------------------------------------------- Prepared and Electronically Authenticated by  Jenkins Rouge, M.D. 2018-03-07T14:09:36 PACS Images   Show images for ECHOCARDIOGRAM COMPLETE Patient Information   Patient Name Garrett Ferrell, Garrett Ferrell Sex Male DOB June 28, 1960 SSN [id number] Reason For Exam  Priority: Anticipated Discharge  Not on file Surgical History   Surgical History    No past medical history on file.  Other Surgical History    Procedure Laterality Date Comment Source ANKLE ARTHROSCOPY Right 04/20/2016 Procedure: ANKLE ARTHROSCOPY; Surgeon: [name], MD; Location: [facility]; Service: Orthopedics; Laterality: Right; Provider ANKLE ARTHROSCOPY WITH FUSION Right 10/05/2016 Procedure: RIGHT ANKLE ARTHROSCOPIC FUSION; Surgeon:  [name], MD; Location: [facility]; Service: Orthopedics; Laterality: Right; Provider ANKLE FUSION Right 04/20/2016 Procedure: Right Posterior Arthroscopic Subtalar Arthrodesis; Surgeon: [name], MD; Location: [facility]; Service: Orthopedics; Laterality: Right; Provider ANKLE SURGERY Right  plates/screws Provider HERNIA REPAIR   1986-umbilical Provider RIGHT HEART CATH  2016 at [facility] during his DVT/PE Provider  Patient Data   Height  72 in  BP  118/84 mmHg    Performing Technologist/Nurse   Performing Technologist/Nurse: Jennette Dubin, RDCS          Implants     No active implants to display in this view. Order-Level Documents - 08/07/2016:  Scan on 08/08/2016 12:41 PM by Default, Provider, MDScan on 08/08/2016 12:41 PM by Default, Provider, MD    Encounter-Level Documents - 08/07/2016:   Document on 08/11/2016 6:37 PM by Faylene Million : ED PB Summary  Document on 08/11/2016 6:37 PM by Faylene Million : ED Encounter Summary  Scan on 08/09/2016 12:10 PM by Default, Provider, MDScan on 08/09/2016 12:10 PM by Default, Provider, MD  Document on 08/08/2016 3:05 PM by Osker Mason, RN : IP After Visit Summary  Document on 08/08/2016 2:55 PM by Osker Mason, RN : IP After Visit Summary  Scan on 08/08/2016 1:04 PM by Default, Provider, MDScan on 08/08/2016 1:04 PM by Default, Provider, MD  Scan on 08/08/2016 9:08 AM by Default, Provider, MDScan on 08/08/2016 9:08 AM by Default, Provider, MD  Electronic signature on 08/07/2016 9:06 AM    Signed   Electronically signed by Josue Hector, MD on 08/08/16 at 1409 EST Printable Result Report    Result Report  External Result Report    External Result Report     Reproductive/Obstetrics                             Anesthesia Physical Anesthesia Plan  ASA: III  Anesthesia Plan: MAC   Post-op Pain Management:    Induction: Intravenous  PONV Risk  Score and Plan: 1 and Ondansetron and Dexamethasone  Airway Management Planned: Natural Airway and Simple Face Mask  Additional Equipment:   Intra-op Plan:   Post-operative Plan:   Informed Consent: I have reviewed the patients History and Physical, chart, labs and discussed the procedure including the risks, benefits and alternatives for the proposed anesthesia with the patient or authorized representative who has indicated his/her understanding and acceptance.     Plan Discussed with: CRNA and Surgeon  Anesthesia Plan Comments:         Anesthesia Quick Evaluation

## 2016-11-27 NOTE — Discharge Instructions (Signed)

## 2016-11-27 NOTE — H&P (Signed)
Procedure: Baseline screening colonoscopy. Chronic eliquis anticoagulation therapy to treat atrial fibrillation. Microcytic anemia. Hemoglobin 12.2 g. MCV 70. Serum iron saturation 34%. Vitamin B 12 level CMXLIII ng/mL. October 2016 bilateral deep venous thrombosis of the legs complicated by pulmonary emboli.  History: The patient is a 56 year old male born 07/13/1960. He is scheduled to undergo his first screening colonoscopy with polypectomy to prevent colon cancer. He has chronic microcytic anemia with normal serum iron saturation and vitamin B-12 levels. He takes eliquis anticoagulation therapy. He has had atrial fibrillation since 1999. In October 2016 he was diagnosed with bilateral deep venous thrombosis of the legs complicated by pulmonary emboli.  Past medical history: Atrial fibrillation diagnosed in 1999. Deep venous thrombosis of both legs complicated by pulmonary emboli in 2016. Gout. Hypercholesterolemia. Hypertension. Seizures. Umbilical hernia surgery. Ankle surgery. Cardiac catheterization.  Exam: The patient is alert and lying comfortably on the endoscopy stretcher. Abdomen is soft and nontender to palpation. Lungs are clear to auscultation. Cardiac exam reveals a regular rhythm.  Plan: Proceed with baseline screening colonoscopy

## 2016-11-27 NOTE — Op Note (Signed)
[facility] Patient Name: Garrett Ferrell CodeJerome Gongora Procedure Date: 11/27/2016 MRN: [id number] Attending MD: Charolett BumpersMartin K Johnson , MD Date of Birth: 09/15/1960 CSN: [id number] Age: 56 Admit Type: Outpatient Procedure:                Colonoscopy Indications:              Screening for colorectal malignant neoplasm Providers:                Charolett BumpersMartin K. Johnson, MD, Omelia BlackwaterShelby Carpenter RN, RN,                            Beryle BeamsJanie Billups, Technician, Leroy Libmaniana Reardon, CRNA Referring MD:              Medicines:                Propofol per Anesthesia Complications:            No immediate complications. Estimated Blood Loss:     Estimated blood loss: none. Procedure:                Pre-Anesthesia Assessment:                           - Prior to the procedure, a History and Physical                            was performed, and patient medications and                            allergies were reviewed. The patient's tolerance of                            previous anesthesia was also reviewed. The risks                            and benefits of the procedure and the sedation                            options and risks were discussed with the patient.                            All questions were answered, and informed consent                            was obtained. Prior Anticoagulants: The patient has                            taken Eliquis (apixaban), last dose was 5 days                            prior to procedure. ASA Grade Assessment: II - A                            patient with mild systemic disease. After reviewing  the risks and benefits, the patient was deemed in                            satisfactory condition to undergo the procedure.                           After obtaining informed consent, the colonoscope                            was passed under direct vision. Throughout the                            procedure, the patient's blood pressure, pulse,  and                            oxygen saturations were monitored continuously. The                            EC-[id number] ([id number]) scope was introduced through                            the anus and advanced to the the cecum, identified                            by appendiceal orifice and ileocecal valve. The                            colonoscopy was performed without difficulty. The                            patient tolerated the procedure well. The quality                            of the bowel preparation was good. The ileocecal                            valve, the appendiceal orifice and the rectum were                            photographed. Scope In: 9:21:25 AM Scope Out: 9:37:45 AM Scope Withdrawal Time: 0 hours 9 minutes 1 second  Total Procedure Duration: 0 hours 16 minutes 20 seconds  Findings:      The perianal and digital rectal examinations were normal.      The entire examined colon appeared normal. Impression:               - The entire examined colon is normal.                           - No specimens collected. Moderate Sedation:      N/A- Per Anesthesia Care Recommendation:           - Patient has a contact number available for  emergencies. The signs and symptoms of potential                            delayed complications were discussed with the                            patient. Return to normal activities tomorrow.                            Written discharge instructions were provided to the                            patient.                           - Repeat colonoscopy in 10 years for screening                            purposes.                           - Resume previous diet.                           - Continue present medications. Procedure Code(s):        --- Professional ---                           E4540, Colorectal cancer screening; colonoscopy on                            individual not meeting criteria for  high risk Diagnosis Code(s):        --- Professional ---                           Z12.11, Encounter for screening for malignant                            neoplasm of colon CPT copyright 2016 [employer]. All rights reserved. The codes documented in this report are preliminary and upon coder review may  be revised to meet current compliance requirements. Danise Edge, MD Charolett Bumpers, MD 11/27/2016 9:44:04 AM This report has been signed electronically. Number of Addenda: 0

## 2016-11-28 NOTE — Anesthesia Postprocedure Evaluation (Signed)
Anesthesia Post Note  Patient: Garrett Ferrell  Procedure(s) Performed: Procedure(s) (LRB): COLONOSCOPY WITH PROPOFOL (N/A)     Patient location during evaluation: Endoscopy Anesthesia Type: MAC Level of consciousness: awake Pain management: pain level controlled Vital Signs Assessment: post-procedure vital signs reviewed and stable Respiratory status: spontaneous breathing Cardiovascular status: stable Postop Assessment: no signs of nausea or vomiting Anesthetic complications: no    Last Vitals:  Vitals:   11/27/16 0950 11/27/16 1000  BP: (!) 100/58 126/79  Pulse: 95 86  Resp: (!) 25 16  Temp:      Last Pain:  Vitals:   11/27/16 0947  TempSrc: Oral  PainSc:    Pain Goal:                 Tansy Lorek JR,JOHN Chetan Mehring

## 2016-11-30 ENCOUNTER — Encounter (HOSPITAL_COMMUNITY): Payer: Self-pay | Admitting: Gastroenterology

## 2016-12-10 ENCOUNTER — Ambulatory Visit (INDEPENDENT_AMBULATORY_CARE_PROVIDER_SITE_OTHER): Payer: Medicaid Other | Admitting: Orthopedic Surgery

## 2016-12-10 ENCOUNTER — Encounter (INDEPENDENT_AMBULATORY_CARE_PROVIDER_SITE_OTHER): Payer: Self-pay | Admitting: Orthopedic Surgery

## 2016-12-10 ENCOUNTER — Ambulatory Visit (INDEPENDENT_AMBULATORY_CARE_PROVIDER_SITE_OTHER): Payer: Medicaid Other

## 2016-12-10 DIAGNOSIS — M12571 Traumatic arthropathy, right ankle and foot: Secondary | ICD-10-CM | POA: Diagnosis not present

## 2016-12-10 MED ORDER — OXYCODONE-ACETAMINOPHEN 5-325 MG PO TABS: 1.0000 | ORAL_TABLET | Freq: Three times a day (TID) | ORAL | 0 refills | Status: DC | PRN

## 2016-12-10 NOTE — Progress Notes (Signed)
Office Visit Note   Patient: Garrett Ferrell           Date of Birth: 01/18/1961           MRN: [id number] Visit Date: 12/10/2016              Requested by: Jackie Plumsei-Bonsu, George, MD [address] [place], [place] [id number] PCP: Jackie Plumsei-Bonsu, George, MD  Chief Complaint  Patient presents with  . Right Ankle - Follow-up      HPI: Patient is seen in follow-up status post ankle fusion arthroscopically. Patient states he still feels like he is having some swelling.  Assessment & Plan: Visit Diagnoses:  1. Traumatic arthritis of right ankle     Plan: Recommended compression stockings recommended continue the fracture boot recommended swimming prescription provided for Percocet for pain and follow-up 2 view radiographs of the right ankle.  Follow-Up Instructions: Return in about 4 weeks (around 01/07/2017).   Ortho Exam  Patient is alert, oriented, no adenopathy, well-dressed, normal affect, normal respiratory effort. Examination patient's foot is plantar grade there is no redness no swelling there is good wrinkling of the skin no drainage.  Imaging: Xr Ankle Complete Right  Result Date: 12/10/2016 Two-view radiographs of the right ankle shows stable alignment of the ankle and subtalar fusion. No lucency around the hardware no hardware failure   Labs: No results found for: HGBA1C, ESRSEDRATE, CRP, LABURIC, REPTSTATUS, GRAMSTAIN, CULT, LABORGA  Orders:  Orders Placed This Encounter  Procedures  . XR Ankle Complete Right   Meds ordered this encounter  Medications  . oxyCODONE-acetaminophen (ROXICET) 5-325 MG tablet    Sig: Take 1 tablet by mouth every 8 (eight) hours as needed for severe pain.    Dispense:  40 tablet    Refill:  0     Procedures: No procedures performed  Clinical Data: No additional findings.  ROS:  All other systems negative, except as noted in the HPI. Review of Systems  Objective: Vital Signs: There were no vitals taken for this  visit.  Specialty Comments:  No specialty comments available.  PMFS History: Patient Active Problem List   Diagnosis Date Noted  . Chest pain 08/07/2016  . Cocaine use 08/07/2016  . Chronic musculoskeletal pain 08/07/2016  . CKD (chronic kidney disease) 08/07/2016  . Depression with anxiety 08/07/2016  . Traumatic arthritis of right ankle 07/31/2016  . 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 Osteoarthritis of right subtalar joint   . Right ankle pain 01/24/2016  . Bilateral hip pain 01/03/2016  . Atrial fibrillation (HCC) 11/18/2015  . Essential hypertension 11/18/2015  . Hyperlipidemia 11/18/2015  . DVT, bilateral lower limbs (HCC) 11/18/2015  . Pulmonary embolus (HCC) 11/18/2015   Past Medical History:  Diagnosis Date  . Anemia    takes Iron pill daily  . Anxiety    takes Xanax daily as needed  . Arthritis   . Atrial fibrillation (HCC)    takes Eliquis daily. Last dose will be 10/03/16  . Depression    takes Risperdal daily  . DVT (deep venous thrombosis) (HCC)    both legs in 2016  . Dyspnea    when laying on back  . Gout    takes Allopurinol daily  . Headache    occasionally  . History of shingles 2010  . Hyperlipidemia    takes Atorvastatin daily  . Hyperlipidemia    takes Atorvastatin daily  . Hypertension    takes Metoprolol daily  . Insomnia  takes Ambien and Trazodone nightly  . Joint pain   . Joint swelling    right ankle  . 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 Nocturia   . Seizures (HCC)    last one in 2002    Family History  Problem Relation Age of Onset  . Heart Problems Mother   . Heart attack Father        age 62s    Past Surgical History:  Procedure Laterality Date  . ANKLE ARTHROSCOPY Right 04/20/2016   Procedure: ANKLE ARTHROSCOPY;  Surgeon: Nadara Mustard, MD;  Location: [facility] OR;  Service: Orthopedics;  Laterality: Right;  . ANKLE ARTHROSCOPY WITH FUSION Right 10/05/2016   Procedure: RIGHT ANKLE ARTHROSCOPIC FUSION;   Surgeon: Nadara Mustard, MD;  Location: [facility] OR;  Service: Orthopedics;  Laterality: Right;  . ANKLE FUSION Right 04/20/2016   Procedure: Right Posterior Arthroscopic Subtalar Arthrodesis;  Surgeon: Nadara Mustard, MD;  Location[facility];  Service: Orthopedics;  Laterality: Right;  . ANKLE SURGERY Right    plates/screws  . COLONOSCOPY WITH PROPOFOL N/A 11/27/2016   Procedure: COLONOSCOPY WITH PROPOFOL;  Surgeon: Charolett Bumpers, MD;  Location: [facility];  Service: Endoscopy;  Laterality: N/A;  . HERNIA REPAIR     1986-umbilical  . RIGHT HEART CATH  2016   at [facility] during his DVT/PE   Social History   Occupational History  . disabled    Social History Main Topics  . Smoking status: Never Smoker  . Smokeless tobacco: Never Used  . Alcohol use 0.0 oz/week     Comment: one month ago- 09/2016  . Drug use: No  . Sexual activity: Not on file

## 2016-12-13 ENCOUNTER — Telehealth: Payer: Self-pay | Admitting: Cardiovascular Disease

## 2016-12-13 NOTE — Telephone Encounter (Signed)
LMTCB

## 2016-12-13 NOTE — Telephone Encounter (Signed)
[name] is calling because Mr.  Ferrell blood pressure has been low its has been around 85/53 has been the lowest but it has been running like 89/60. Please call

## 2016-12-13 NOTE — Telephone Encounter (Signed)
Returned call to wife Garrett Ferrell She states PCP told them that one of his BP medications may need to be adjusted. Verified that patient is taking losartan 811m QD and diltiazem 240mg  QD. Patient's BP is running high 80s to low 90s. He just had surgery about 1 month ago, so unable to assess if experiencing fatigue but does not complain of lightheadedness however reports low energy. Wife states they noticed low BP about 1 week ago but had not been checking regularly. Denies chest pain, shortness of breath. Only med change was increase in risperidone to BID dosing.   Patient scheduled to see Dr. Allyson SabalBerry 7/27 Will routed to [place], [place] (saw patient in April) & pharmacy staff for recommendations.  -- decrease ARB dose? HTN clinic appt?

## 2016-12-13 NOTE — Telephone Encounter (Signed)
New message     Pt wife is returning your call about husband blood pressure

## 2016-12-13 NOTE — Telephone Encounter (Signed)
May temporarily hold losartan until next office visit. Do not hold diltiazem as he rely on diltiazem to control his heart rate

## 2016-12-14 NOTE — Telephone Encounter (Signed)
Called the patient back. Unable to leave voicemail.

## 2016-12-18 NOTE — Telephone Encounter (Signed)
Left message to call back, per DPR. 

## 2016-12-28 ENCOUNTER — Ambulatory Visit: Payer: Medicaid Other | Admitting: Cardiovascular Disease

## 2017-01-01 ENCOUNTER — Ambulatory Visit: Payer: Medicaid Other | Admitting: Cardiovascular Disease

## 2017-01-03 ENCOUNTER — Ambulatory Visit: Payer: Medicaid Other | Admitting: Physician Assistant

## 2017-01-03 NOTE — Progress Notes (Deleted)
Cardiology Office Note    Date:  01/03/2017   ID:  Garrett CodeJerome Ferrell, DOB 09/10/1960, MRN [id number]  PCP:  Jackie Plumsei-Bonsu, George, MD  Cardiologist:  Dr. Allyson SabalBerry   No chief complaint on file.   History of Present Illness:  Garrett Ferrell is a 56 y.o. 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 He was recently establish with us on 11/18/2015. He never had heart attack or stroke himself, however has significant family history of CAD with his father died of MI in his 1940s. His mother has a pacemaker. He says he was on Pradaxa before, and transitioned to eliquis. He was on a long trip in 2016, when he forgot to take his eliquis for 3 days, he ended up in the hospital with DVT and PE. Based [name], he had a left popliteal DVT at the time, V/Q skin read as high likelihood of PE, CTA confirmed extensive bilateral pulmonary emboli. He eventually underwent right heart catheterization with EKOS therapy on 03/24/2015 for saddle emboli. Left coronary was not injected. I saw him in November 2017 for preoperative clearance, I cleared him after a low risk Myoview on 04/05/2016. He was admitted in March with chest pain after cocaine use. Echocardiogram repeated on 08/08/2016 showed EF 60-65%, no regional wall motion abnormality. Given negative stress test, no further workup was felt to be necessary. I last saw him on 09/05/2016, he was still taking metoprolol at the time. I discontinued his metoprolol and instructed him to start on Cardizem CD 240 mg daily.  No EKG    Past Medical History:  Diagnosis Date  . Anemia    takes Iron pill daily  . Anxiety    takes Xanax daily as needed  . Arthritis   . Atrial fibrillation (HCC)    takes Eliquis daily. Last dose will be 10/03/16  . Depression    takes Risperdal daily  . DVT (deep venous  thrombosis) (HCC)    both legs in 2016  . Dyspnea    when laying on back  . Gout    takes Allopurinol daily  . Headache    occasionally  . History of shingles 2010  . Hyperlipidemia    takes Atorvastatin daily  . Hyperlipidemia    takes Atorvastatin daily  . Hypertension    takes Metoprolol daily  . Insomnia    takes Ambien and Trazodone nightly  . Joint pain   . Joint swelling    right ankle  . 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 Nocturia   . Seizures (HCC)    last one in 2002    Past Surgical History:  Procedure Laterality Date  . ANKLE ARTHROSCOPY Right 04/20/2016   Procedure: ANKLE ARTHROSCOPY;  Surgeon: Nadara MustardMarcus V Duda, MD;  [facility];  Service: Orthopedics;  Laterality: Right;  . ANKLE ARTHROSCOPY WITH FUSION Right 10/05/2016   Procedure: RIGHT ANKLE ARTHROSCOPIC FUSION;  Surgeon: Nadara Mustarduda, Marcus V, MD;  [facility];  Service: Orthopedics;  Laterality: Right;  . ANKLE FUSION Right 04/20/2016   Procedure: Right Posterior Arthroscopic Subtalar Arthrodesis;  Surgeon: Nadara MustardMarcus V Duda, MD;  [facility];  Service: Orthopedics;  Laterality: Right;  . ANKLE SURGERY Right    plates/screws  . COLONOSCOPY WITH PROPOFOL N/A 11/27/2016   Procedure: COLONOSCOPY WITH PROPOFOL;  Surgeon: Charolett BumpersJohnson, Martin K, MD;  [facility];  Service: Endoscopy;  Laterality: N/A;  . HERNIA REPAIR     1986-umbilical  . RIGHT HEART CATH  2016   at [facility] during his DVT/PE    Current Medications: Outpatient Medications Prior to Visit  Medication Sig Dispense Refill  . allopurinol (ZYLOPRIM) 100 MG tablet Take 100 mg by mouth daily.  5  . ALPRAZolam (XANAX) 0.5 MG tablet Take 0.5 mg by mouth every 8 (eight) hours as needed for anxiety.   0  . atorvastatin (LIPITOR) 10 MG tablet Take 10 mg by mouth every evening.     . diltiazem (CARDIZEM CD) 240 MG 24 hr capsule Take 1 capsule (240 mg total) by mouth daily. 30 capsule 0  . ELIQUIS 5 MG TABS tablet  Take 1 tablet (5 mg total) by mouth 2 (two) times daily. 60 tablet 11  . ferrous sulfate 325 (65 FE) MG EC tablet Take 325 mg by mouth daily with breakfast.    . losartan (COZAAR) 25 MG tablet Take 25 mg by mouth daily.  3  . meloxicam (MOBIC) 15 MG tablet Take 1 tablet (15 mg total) by mouth daily. 30 tablet 1  . oxyCODONE-acetaminophen (ROXICET) 5-325 MG tablet Take 1 tablet by mouth every 8 (eight) hours as needed for severe pain. 40 tablet 0  . risperiDONE (RISPERDAL) 1 MG tablet Take 2 mg by mouth 2 (two) times daily.   5  . SEROQUEL XR 150 MG 24 hr tablet Take 150 mg by mouth at bedtime.  1  . tiZANidine (ZANAFLEX) 2 MG tablet Take 2-4 mg by mouth every 8 (eight) hours as needed for muscle spasms.   1  . trazodone (DESYREL) 300 MG tablet Take 300 mg by mouth at bedtime.  5  . VIAGRA 100 MG tablet Take 100 mg by mouth daily as needed for erectile dysfunction.   2  . zolpidem (AMBIEN) 10 MG tablet Take 10 mg by mouth at bedtime.      No facility-administered medications prior to visit.      Allergies:   Patient has no known allergies.   Social History   Social History  . Marital status: Married    Spouse name: Garrett Ferrell  . Number of children: 5  . Years of education: N/A   Occupational History  . disabled    Social History Main Topics  . Smoking status: Never Smoker  . Smokeless tobacco: Never Used  . Alcohol use 0.0 oz/week     Comment: one month ago- 09/2016  . Drug use: No  . Sexual activity: Not on file   Other Topics Concern  . Not on file   Social History Narrative  . No narrative on file     Family History:  The patient's ***family history includes Heart Problems in his mother; Heart attack in his father.   ROS:   Please see the history of present illness.    ROS All other systems reviewed and are negative.   PHYSICAL EXAM:   VS:  There were no vitals taken for this visit.   GEN: Well nourished, well developed, in no acute distress  HEENT: normal  Neck:  no JVD, carotid bruits, or masses Cardiac: ***RRR; no murmurs, rubs, or gallops,no edema  Respiratory:  clear to auscultation bilaterally, normal work of breathing GI: soft, nontender, nondistended, + BS MS: no deformity or atrophy  Skin: warm and dry, no rash Neuro:  Alert and Oriented x 3, Strength and sensation are  intact Psych: euthymic mood, full affect  Wt Readings from Last 3 Encounters:  11/27/16 261 lb (118.4 kg)  11/12/16 261 lb (118.4 kg)  10/22/16 261 lb (118.4 kg)      Studies/Labs Reviewed:   EKG:  EKG is*** ordered today.  The ekg ordered today demonstrates ***  Recent Labs: 10/03/2016: BUN 8; Creatinine, Ser 1.30; Hemoglobin 12.2; Platelets 162; Potassium 3.8; Sodium 139   Lipid Panel No results found for: CHOL, TRIG, HDL, CHOLHDL, VLDL, LDLCALC, LDLDIRECT  Additional studies/ records that were reviewed today include:  ***    ASSESSMENT:    No diagnosis found.   PLAN:  In order of problems listed above:  1. ***    Medication Adjustments/Labs and Tests Ordered: Current medicines are reviewed at length with the patient today.  Concerns regarding medicines are outlined above.  Medication changes, Labs and Tests ordered today are listed in the Patient Instructions below. There are no Patient Instructions on file for this visit.   Ramond DialSigned, Joyice Magda, [place]  01/03/2017 8:02 AM    [facility] [address], [place], [place]  [address] Phone: [phone]) [phone]; Fax: [phone]) [phone]

## 2017-01-07 ENCOUNTER — Ambulatory Visit (INDEPENDENT_AMBULATORY_CARE_PROVIDER_SITE_OTHER): Payer: Medicaid Other | Admitting: Orthopedic Surgery

## 2017-01-14 ENCOUNTER — Ambulatory Visit (INDEPENDENT_AMBULATORY_CARE_PROVIDER_SITE_OTHER): Payer: Medicaid Other | Admitting: Family

## 2017-01-17 ENCOUNTER — Ambulatory Visit (INDEPENDENT_AMBULATORY_CARE_PROVIDER_SITE_OTHER): Payer: Medicaid Other

## 2017-01-17 ENCOUNTER — Ambulatory Visit (INDEPENDENT_AMBULATORY_CARE_PROVIDER_SITE_OTHER): Payer: Medicaid Other | Admitting: Orthopedic Surgery

## 2017-01-17 ENCOUNTER — Encounter (INDEPENDENT_AMBULATORY_CARE_PROVIDER_SITE_OTHER): Payer: Self-pay | Admitting: Orthopedic Surgery

## 2017-01-17 DIAGNOSIS — M12571 Traumatic arthropathy, right ankle and foot: Secondary | ICD-10-CM

## 2017-01-17 MED ORDER — OXYCODONE-ACETAMINOPHEN 5-325 MG PO TABS: 1.0000 | ORAL_TABLET | Freq: Three times a day (TID) | ORAL | 0 refills | Status: DC | PRN

## 2017-01-17 NOTE — Progress Notes (Signed)
Office Visit Note   Patient: Garrett Ferrell           Date of Birth: 1960-10-18           MRN: [id number] Visit Date: 01/17/2017              Requested by: Jackie Plum, MD [address] [place], [place] [address] PCP: Jackie Plum, MD  Chief Complaint  Patient presents with  . Right Ankle - Pain, Follow-up      HPI: Patient presents status post subtalar and ankle fusion. Patient states he still having pain 7 out of 10 and he is wearing a fracture boot full weightbearing.  Assessment & Plan: Visit Diagnoses:  1. Traumatic arthritis of right ankle     Plan: We'll place him in a short leg walking cast.  Follow-up in 3 weeks with repeat 3 view radiographs of the right ankle out of plaster.  Follow-Up Instructions: Return in about 3 weeks (around 02/07/2017).   Ortho Exam  Patient is alert, oriented, no adenopathy, well-dressed, normal affect, normal respiratory effort. Examination there is no swelling no redness his foot is plantar grade. Clinically the fusion site is stable.  Imaging: Xr Ankle 2 Views Right  Result Date: 01/17/2017 2 view radiographs of the right ankle shows persistent lucency at the fusion site.  No images are attached to the encounter.  Labs: No results found for: HGBA1C, ESRSEDRATE, CRP, LABURIC, REPTSTATUS, GRAMSTAIN, CULT, LABORGA  Orders:  Orders Placed This Encounter  Procedures  . XR Ankle 2 Views Right   No orders of the defined types were placed in this encounter.    Procedures: No procedures performed  Clinical Data: No additional findings.  ROS:  All other systems negative, except as noted in the HPI. Review of Systems  Objective: Vital Signs: There were no vitals taken for this visit.  Specialty Comments:  No specialty comments available.  PMFS History: Patient Active Problem List   Diagnosis Date Noted  . Chest pain 08/07/2016  . Cocaine use 08/07/2016  . Chronic musculoskeletal pain  08/07/2016  . CKD (chronic kidney disease) 08/07/2016  . Depression with anxiety 08/07/2016  . Traumatic arthritis of right ankle 07/31/2016  . 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 Osteoarthritis of right subtalar joint   . Right ankle pain 01/24/2016  . Bilateral hip pain 01/03/2016  . Atrial fibrillation (HCC) 11/18/2015  . Essential hypertension 11/18/2015  . Hyperlipidemia 11/18/2015  . DVT, bilateral lower limbs (HCC) 11/18/2015  . Pulmonary embolus (HCC) 11/18/2015   Past Medical History:  Diagnosis Date  . Anemia    takes Iron pill daily  . Anxiety    takes Xanax daily as needed  . Arthritis   . Atrial fibrillation (HCC)    takes Eliquis daily. Last dose will be 10/03/16  . Depression    takes Risperdal daily  . DVT (deep venous thrombosis) (HCC)    both legs in 2016  . Dyspnea    when laying on back  . Gout    takes Allopurinol daily  . Headache    occasionally  . History of shingles 2010  . Hyperlipidemia    takes Atorvastatin daily  . Hyperlipidemia    takes Atorvastatin daily  . Hypertension    takes Metoprolol daily  . Insomnia    takes Ambien and Trazodone nightly  . Joint pain   . Joint swelling    right ankle  . Nerve damage    optical  in right eye  . Nocturia   . Seizures (HCC)    last one in 2002    Family History  Problem Relation Age of Onset  . Heart Problems Mother   . Heart attack Father        age 6540s    Past Surgical History:  Procedure Laterality Date  . ANKLE ARTHROSCOPY Right 04/20/2016   Procedure: ANKLE ARTHROSCOPY;  Surgeon: Nadara MustardMarcus V Karrie Fluellen, MD;  [facility];  Service: Orthopedics;  Laterality: Right;  . ANKLE ARTHROSCOPY WITH FUSION Right 10/05/2016   Procedure: RIGHT ANKLE ARTHROSCOPIC FUSION;  Surgeon: Nadara Mustarduda, Tilmon Wisehart V, MD;  [facility];  Service: Orthopedics;  Laterality: Right;  . ANKLE FUSION Right 04/20/2016   Procedure: Right Posterior Arthroscopic Subtalar Arthrodesis;  Surgeon: Nadara MustardMarcus V Vanderbilt Ranieri, MD;   [facility];  Service: Orthopedics;  Laterality: Right;  . ANKLE SURGERY Right    plates/screws  . COLONOSCOPY WITH PROPOFOL N/A 11/27/2016   Procedure: COLONOSCOPY WITH PROPOFOL;  Surgeon: Charolett BumpersJohnson, Martin K, MD;  [facility];  Service: Endoscopy;  Laterality: N/A;  . HERNIA REPAIR     1986-umbilical  . RIGHT HEART CATH  [facility]   Social History   Occupational History  . disabled    Social History Main Topics  . Smoking status: Never Smoker  . Smokeless tobacco: Never Used  . Alcohol use 0.0 oz/week     Comment: one month ago- 09/2016  . Drug use: No  . Sexual activity: Not on file

## 2017-01-22 ENCOUNTER — Ambulatory Visit: Payer: Medicaid Other | Admitting: Physician Assistant

## 2017-01-22 NOTE — Progress Notes (Deleted)
Cardiology Office Note    Date:  01/22/2017   ID:  Garrett Ferrell, DOB 04-Feb-1961, MRN [id number]  PCP:  Jackie Plum, MD  Cardiologist:  Dr. Allyson Sabal  No chief complaint on file.   History of Present Illness:  Garrett Ferrell is a 56 y.o. 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 He was recently establish with [place] on 11/18/2015. He never had heart attack or stroke himself, however has significant family history of CAD with his father died of MI in his 51s. His mother has a pacemaker. He says he was on Pradaxa before, and transitioned to eliquis. He was on a long trip in 2016, when he forgot to take his eliquis for 3 days, he ended up in the hospital with DVT and PE. Based on outside records from [facility] in [facility] [place], he had a left popliteal DVT at the time, V/Q skin read as high likelihood of PE, CTA confirmed extensive bilateral pulmonary emboli. He eventually underwent right heart catheterization with EKOS therapy on 03/24/2015 for saddle emboli. Left coronary was not injected. I saw the patient in November 2017, Myoview obtained on 04/05/2016 was low risk. He was cleared for surgery. He was later admitted in March 2018 with chest pain after using cocaine. Echocardiogram was repeated on 08/08/2016 this showed EF 60-65%, no regional wall motion abnormality. His metoprolol was switched to Cardizem CD. I last saw the patient on 09/05/2016, he was doing well as a time. He underwent right ankle arthroscopy fusion for traumatic arthritis by Dr. Lajoyce Corners on 10/05/2016. Due to occasional low blood pressure, his losartan was held and he was continued on diltiazem only.  No EKG      Past Medical History:  Diagnosis Date  . Anemia    takes Iron pill daily  . Anxiety    takes Xanax daily as needed  . Arthritis   . Atrial fibrillation (HCC)    takes Eliquis  daily. Last dose will be 10/03/16  . Depression    takes Risperdal daily  . DVT (deep venous thrombosis) (HCC)    both legs in 2016  . Dyspnea    when laying on back  . Gout    takes Allopurinol daily  . Headache    occasionally  . History of shingles 2010  . Hyperlipidemia    takes Atorvastatin daily  . Hyperlipidemia    takes Atorvastatin daily  . Hypertension    takes Metoprolol daily  . Insomnia    takes Ambien and Trazodone nightly  . Joint pain   . Joint swelling    right ankle  . 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 Nocturia   . Seizures (HCC)    last one in 2002    Past Surgical History:  Procedure Laterality Date  . ANKLE ARTHROSCOPY Right 04/20/2016   Procedure: ANKLE ARTHROSCOPY;  Surgeon: Nadara Mustard, MD;  Location: [facility] OR;  Service: Orthopedics;  Laterality: Right;  . ANKLE ARTHROSCOPY WITH FUSION Right 10/05/2016   Procedure: RIGHT ANKLE ARTHROSCOPIC FUSION;  Surgeon: Nadara Mustard, MD;  Location: [facility] OR;  Service: Orthopedics;  Laterality: Right;  . ANKLE FUSION Right 04/20/2016   Procedure: Right Posterior Arthroscopic Subtalar Arthrodesis;  Surgeon: Nadara Mustard, MD;  Location: [facility] OR;  Service: Orthopedics;  Laterality: Right;  . ANKLE SURGERY Right    plates/screws  . COLONOSCOPY  WITH PROPOFOL N/A 11/27/2016   Procedure: COLONOSCOPY WITH PROPOFOL;  Surgeon: Charolett Bumpers, MD;  Location: [facility]COPY;  Service: Endoscopy;  Laterality: N/A;  . HERNIA REPAIR     1986-umbilical  . RIGHT HEART CATH  2016   at [facility] during his DVT/PE    Current Medications: Outpatient Medications Prior to Visit  Medication Sig Dispense Refill  . allopurinol (ZYLOPRIM) 100 MG tablet Take 100 mg by mouth daily.  5  . ALPRAZolam (XANAX) 0.5 MG tablet Take 0.5 mg by mouth every 8 (eight) hours as needed for anxiety.   0  . atorvastatin (LIPITOR) 10 MG tablet Take 10 mg by mouth every evening.     . diltiazem (CARDIZEM CD) 240 MG 24 hr  capsule Take 1 capsule (240 mg total) by mouth daily. 30 capsule 0  . ELIQUIS 5 MG TABS tablet Take 1 tablet (5 mg total) by mouth 2 (two) times daily. 60 tablet 11  . ferrous sulfate 325 (65 FE) MG EC tablet Take 325 mg by mouth daily with breakfast.    . losartan (COZAAR) 25 MG tablet Take 25 mg by mouth daily.  3  . meloxicam (MOBIC) 15 MG tablet Take 1 tablet (15 mg total) by mouth daily. 30 tablet 1  . oxyCODONE-acetaminophen (ROXICET) 5-325 MG tablet Take 1 tablet by mouth every 8 (eight) hours as needed for severe pain. 30 tablet 0  . risperiDONE (RISPERDAL) 1 MG tablet Take 2 mg by mouth 2 (two) times daily.   5  . SEROQUEL XR 150 MG 24 hr tablet Take 150 mg by mouth at bedtime.  1  . tiZANidine (ZANAFLEX) 2 MG tablet Take 2-4 mg by mouth every 8 (eight) hours as needed for muscle spasms.   1  . trazodone (DESYREL) 300 MG tablet Take 300 mg by mouth at bedtime.  5  . VIAGRA 100 MG tablet Take 100 mg by mouth daily as needed for erectile dysfunction.   2  . zolpidem (AMBIEN) 10 MG tablet Take 10 mg by mouth at bedtime.      No facility-administered medications prior to visit.      Allergies:   Patient has no known allergies.   Social History   Social History  . Marital status: Married    Spouse name: [name]  . Number of children: 5  . Years of education: N/A   Occupational History  . disabled    Social History Main Topics  . Smoking status: Never Smoker  . Smokeless tobacco: Never Used  . Alcohol use 0.0 oz/week     Comment: one month ago- 09/2016  . Drug use: No  . Sexual activity: Not on file   Other Topics Concern  . Not on file   Social History Narrative  . No narrative on file     Family History:  The patient's ***family history includes Heart Problems in his mother; Heart attack in his father.   ROS:   Please see the history of present illness.    ROS All other systems reviewed and are negative.   PHYSICAL EXAM:   VS:  There were no vitals taken for  this visit.   GEN: Well nourished, well developed, in no acute distress  HEENT: normal  Neck: no JVD, carotid bruits, or masses Cardiac: ***RRR; no murmurs, rubs, or gallops,no edema  Respiratory:  clear to auscultation bilaterally, normal work of breathing GI: soft, nontender, nondistended, + BS MS: no deformity or atrophy  Skin: warm and dry, no rash Neuro:  Alert and Oriented x 3, Strength and sensation are intact Psych: euthymic mood, full affect  Wt Readings from Last 3 Encounters:  11/27/16 261 lb (118.4 kg)  11/12/16 261 lb (118.4 kg)  10/22/16 261 lb (118.4 kg)      Studies/Labs Reviewed:   EKG:  EKG is*** ordered today.  The ekg ordered today demonstrates ***  Recent Labs: 10/03/2016: BUN 8; Creatinine, Ser 1.30; Hemoglobin 12.2; Platelets 162; Potassium 3.8; Sodium 139   Lipid Panel No results found for: CHOL, TRIG, HDL, CHOLHDL, VLDL, LDLCALC, LDLDIRECT  Additional studies/ records that were reviewed today include:  ***    ASSESSMENT:    No diagnosis found.   PLAN:  In order of problems listed above:  1. ***    Medication Adjustments/Labs and Tests Ordered: Current medicines are reviewed at length with the patient today.  Concerns regarding medicines are outlined above.  Medication changes, Labs and Tests ordered today are listed in the Patient Instructions below. There are no Patient Instructions on file for this visit.   Ramond Dial, [place]  01/22/2017 2:39 PM    [facility] [address] [place], [place], [place]  [address] Phone: [phone]; Fax: [phone]

## 2017-01-24 NOTE — Addendum Note (Signed)
Addendum  created 01/24/17 1402 by Lexa Coronado, MD   Sign clinical note    

## 2017-02-07 ENCOUNTER — Ambulatory Visit (INDEPENDENT_AMBULATORY_CARE_PROVIDER_SITE_OTHER): Payer: Medicaid Other

## 2017-02-07 ENCOUNTER — Ambulatory Visit (INDEPENDENT_AMBULATORY_CARE_PROVIDER_SITE_OTHER): Payer: Medicaid Other | Admitting: Orthopedic Surgery

## 2017-02-07 ENCOUNTER — Encounter (INDEPENDENT_AMBULATORY_CARE_PROVIDER_SITE_OTHER): Payer: Self-pay | Admitting: Orthopedic Surgery

## 2017-02-07 DIAGNOSIS — M12571 Traumatic arthropathy, right ankle and foot: Secondary | ICD-10-CM | POA: Diagnosis not present

## 2017-02-07 MED ORDER — OXYCODONE-ACETAMINOPHEN 5-325 MG PO TABS: 1.0000 | ORAL_TABLET | Freq: Three times a day (TID) | ORAL | 0 refills | Status: DC | PRN

## 2017-02-07 NOTE — Progress Notes (Signed)
Office Visit Note   Patient: Garrett Ferrell           Date of Birth: [birth year]           MRN: [id number] Visit Date: 02/07/2017              Requested by: Jackie Plum, MD [address] [place], [place] [address] PCP: Jackie Plum, MD  Chief Complaint  Patient presents with  . Right Ankle - Follow-up  . Results      HPI: Patient presents status post ankle and subtalar fusion on the right. Patient states that he is asymptomatic at time  Assessment & Plan: Visit Diagnoses:  1. Traumatic arthritis of right ankle     Plan: Patient does not want to the cast we will place him in a fracture boot. He will continue with protected weightbearing is given a prescription for Percocet for breakthrough pain. Repeat radiographs of follow-up.  Follow-Up Instructions: Return in about 3 weeks (around 02/28/2017).   Ortho Exam  Patient is alert, oriented, no adenopathy, well-dressed, normal affect, normal respiratory effort. Examination there is no swelling there is no redness no cellulitis no open woundsor infection. The ankle is nontender to palpation attempted distraction is not painful.  Imaging: Xr Ankle Complete Right  Result Date: 02/07/2017 Three-view radiographs of the right ankle shows improved healing at the subtalar and tibiotalar fusion.  No images are attached to the encounter.  Labs: No results found for: HGBA1C, ESRSEDRATE, CRP, LABURIC, REPTSTATUS, GRAMSTAIN, CULT, LABORGA  Orders:  Orders Placed This Encounter  Procedures  . XR Ankle Complete Right   Meds ordered this encounter  Medications  . oxyCODONE-acetaminophen (ROXICET) 5-325 MG tablet    Sig: Take 1 tablet by mouth every 8 (eight) hours as needed for severe pain.    Dispense:  30 tablet    Refill:  0     Procedures: No procedures performed  Clinical Data: No additional findings.  ROS:  All other systems negative, except as noted in the HPI. Review of  Systems  Objective: Vital Signs: There were no vitals taken for this visit.  Specialty Comments:  No specialty comments available.  PMFS History: Patient Active Problem List   Diagnosis Date Noted  . Chest pain 08/07/2016  . Cocaine use 08/07/2016  . Chronic musculoskeletal pain 08/07/2016  . CKD (chronic kidney disease) 08/07/2016  . Depression with anxiety 08/07/2016  . Traumatic arthritis of right ankle 07/31/2016  . 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 Osteoarthritis of right subtalar joint   . Right ankle pain 01/24/2016  . Bilateral hip pain 01/03/2016  . Atrial fibrillation (HCC) 11/18/2015  . Essential hypertension 11/18/2015  . Hyperlipidemia 11/18/2015  . DVT, bilateral lower limbs (HCC) 11/18/2015  . Pulmonary embolus (HCC) 11/18/2015   Past Medical History:  Diagnosis Date  . Anemia    takes Iron pill daily  . Anxiety    takes Xanax daily as needed  . Arthritis   . Atrial fibrillation (HCC)    takes Eliquis daily. Last dose will be 10/03/16  . Depression    takes Risperdal daily  . DVT (deep venous thrombosis) (HCC)    both legs in 2016  . Dyspnea    when laying on back  . Gout    takes Allopurinol daily  . Headache    occasionally  . History of shingles 2010  . Hyperlipidemia    takes Atorvastatin daily  . Hyperlipidemia    takes Atorvastatin  daily  . Hypertension    takes Metoprolol daily  . Insomnia    takes Ambien and Trazodone nightly  . Joint pain   . Joint swelling    right ankle  . 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 Nocturia   . Seizures (HCC)    last one in 2002    Family History  Problem Relation Age of Onset  . Heart Problems Mother   . Heart attack Father        age 2140s    Past Surgical History:  Procedure Laterality Date  . ANKLE ARTHROSCOPY Right 04/20/2016   Procedure: ANKLE ARTHROSCOPY;  Surgeon: Nadara MustardMarcus V Duda, MD;  [facility];  Service: Orthopedics;  Laterality: Right;  . ANKLE ARTHROSCOPY WITH  FUSION Right 10/05/2016   Procedure: RIGHT ANKLE ARTHROSCOPIC FUSION;  Surgeon: Nadara Mustarduda, Marcus V, MD;  [facility];  Service: Orthopedics;  Laterality: Right;  . ANKLE FUSION Right 04/20/2016   Procedure: Right Posterior Arthroscopic Subtalar Arthrodesis;  Surgeon: Nadara MustardMarcus V Duda, MD;  [facility];  Service: Orthopedics;  Laterality: Right;  . ANKLE SURGERY Right    plates/screws  . COLONOSCOPY WITH PROPOFOL N/A 11/27/2016   Procedure: COLONOSCOPY WITH PROPOFOL;  Surgeon: Charolett BumpersJohnson, Martin K, MD;  Location: [facility];  Service: Endoscopy;  Laterality: N/A;  . HERNIA REPAIR     1986-umbilical  . RIGHT HEART CATH  2016   [facility] his [facility]   Social History   Occupational History  . disabled    Social History Main Topics  . Smoking status: Never Smoker  . Smokeless tobacco: Never Used  . Alcohol use 0.0 oz/week     Comment: one month ago- 09/2016  . Drug use: No  . Sexual activity: Not on file

## 2017-02-28 ENCOUNTER — Ambulatory Visit (INDEPENDENT_AMBULATORY_CARE_PROVIDER_SITE_OTHER): Payer: Medicaid Other | Admitting: Orthopedic Surgery

## 2017-05-14 ENCOUNTER — Telehealth: Payer: Self-pay | Admitting: Internal Medicine

## 2017-05-14 NOTE — Telephone Encounter (Signed)
Ms Garrett Ferrell called  That , Garrett Ferrell Auth is c/o  Pain  And  Soreness in legs  And  Slight  Swelling  And  We  Recommended to be  Checked in ER and or  Clinic  For  further  eval  For  Possible Acute  DVT

## 2017-05-29 ENCOUNTER — Encounter (HOSPITAL_COMMUNITY): Payer: Self-pay

## 2017-05-29 ENCOUNTER — Emergency Department (HOSPITAL_COMMUNITY): Payer: Medicaid Other

## 2017-05-29 ENCOUNTER — Inpatient Hospital Stay (HOSPITAL_COMMUNITY)
Admission: EM | Admit: 2017-05-29 | Discharge: 2017-06-01 | DRG: 897 | Disposition: A | Discharge status: Home / Self Care | Payer: Medicaid Other | Attending: Internal Medicine | Admitting: Internal Medicine

## 2017-05-29 ENCOUNTER — Other Ambulatory Visit: Payer: Self-pay

## 2017-05-29 DIAGNOSIS — E785 Hyperlipidemia, unspecified: Secondary | ICD-10-CM | POA: Diagnosis not present

## 2017-05-29 DIAGNOSIS — R55 Syncope and collapse: Secondary | ICD-10-CM | POA: Diagnosis not present

## 2017-05-29 DIAGNOSIS — G4700 Insomnia, unspecified: Secondary | ICD-10-CM | POA: Diagnosis present

## 2017-05-29 DIAGNOSIS — Y902 Blood alcohol level of 40-59 mg/100 ml: Secondary | ICD-10-CM | POA: Diagnosis present

## 2017-05-29 DIAGNOSIS — S069XAA Unspecified intracranial injury with loss of consciousness status unknown, initial encounter: Secondary | ICD-10-CM

## 2017-05-29 DIAGNOSIS — I482 Chronic atrial fibrillation, unspecified: Secondary | ICD-10-CM

## 2017-05-29 DIAGNOSIS — Z7901 Long term (current) use of anticoagulants: Secondary | ICD-10-CM

## 2017-05-29 DIAGNOSIS — Z8249 Family history of ischemic heart disease and other diseases of the circulatory system: Secondary | ICD-10-CM

## 2017-05-29 DIAGNOSIS — E872 Acidosis: Secondary | ICD-10-CM | POA: Diagnosis present

## 2017-05-29 DIAGNOSIS — M1990 Unspecified osteoarthritis, unspecified site: Secondary | ICD-10-CM | POA: Diagnosis present

## 2017-05-29 DIAGNOSIS — S069X9A Unspecified intracranial injury with loss of consciousness of unspecified duration, initial encounter: Secondary | ICD-10-CM

## 2017-05-29 DIAGNOSIS — I10 Essential (primary) hypertension: Secondary | ICD-10-CM | POA: Diagnosis present

## 2017-05-29 DIAGNOSIS — F319 Bipolar disorder, unspecified: Secondary | ICD-10-CM | POA: Diagnosis not present

## 2017-05-29 DIAGNOSIS — Z87820 Personal history of traumatic brain injury: Secondary | ICD-10-CM

## 2017-05-29 DIAGNOSIS — H5462 Unqualified visual loss, left eye, normal vision right eye: Secondary | ICD-10-CM | POA: Diagnosis present

## 2017-05-29 DIAGNOSIS — F419 Anxiety disorder, unspecified: Secondary | ICD-10-CM | POA: Diagnosis present

## 2017-05-29 DIAGNOSIS — Y92012 Bathroom of single-family (private) house as the place of occurrence of the external cause: Secondary | ICD-10-CM

## 2017-05-29 DIAGNOSIS — Z791 Long term (current) use of non-steroidal anti-inflammatories (NSAID): Secondary | ICD-10-CM

## 2017-05-29 DIAGNOSIS — F209 Schizophrenia, unspecified: Secondary | ICD-10-CM

## 2017-05-29 DIAGNOSIS — R39198 Other difficulties with micturition: Secondary | ICD-10-CM | POA: Diagnosis present

## 2017-05-29 DIAGNOSIS — F10129 Alcohol abuse with intoxication, unspecified: Principal | ICD-10-CM

## 2017-05-29 DIAGNOSIS — R296 Repeated falls: Secondary | ICD-10-CM

## 2017-05-29 DIAGNOSIS — Z79899 Other long term (current) drug therapy: Secondary | ICD-10-CM

## 2017-05-29 DIAGNOSIS — W1830XA Fall on same level, unspecified, initial encounter: Secondary | ICD-10-CM | POA: Diagnosis present

## 2017-05-29 DIAGNOSIS — Z23 Encounter for immunization: Secondary | ICD-10-CM

## 2017-05-29 DIAGNOSIS — R4781 Slurred speech: Secondary | ICD-10-CM

## 2017-05-29 DIAGNOSIS — M109 Gout, unspecified: Secondary | ICD-10-CM | POA: Diagnosis present

## 2017-05-29 DIAGNOSIS — G621 Alcoholic polyneuropathy: Secondary | ICD-10-CM | POA: Diagnosis present

## 2017-05-29 DIAGNOSIS — D649 Anemia, unspecified: Secondary | ICD-10-CM | POA: Diagnosis present

## 2017-05-29 LAB — COMPREHENSIVE METABOLIC PANEL
ALT: 18 U/L (ref 17–63)
ANION GAP: 17 — AB (ref 5–15)
AST: 28 U/L (ref 15–41)
Albumin: 3.1 g/dL — ABNORMAL LOW (ref 3.5–5.0)
Alkaline Phosphatase: 49 U/L (ref 38–126)
BILIRUBIN TOTAL: 0.6 mg/dL (ref 0.3–1.2)
BUN: 11 mg/dL (ref 6–20)
CO2: 17 mmol/L — ABNORMAL LOW (ref 22–32)
Calcium: 8.7 mg/dL — ABNORMAL LOW (ref 8.9–10.3)
Chloride: 108 mmol/L (ref 101–111)
Creatinine, Ser: 1.45 mg/dL — ABNORMAL HIGH (ref 0.61–1.24)
GFR, EST NON AFRICAN AMERICAN: 53 mL/min — AB (ref >60)
Glucose, Bld: 119 mg/dL — ABNORMAL HIGH (ref 65–99)
POTASSIUM: 3.9 mmol/L (ref 3.5–5.1)
Sodium: 142 mmol/L (ref 135–145)
TOTAL PROTEIN: 6.5 g/dL (ref 6.5–8.1)

## 2017-05-29 LAB — RAPID URINE DRUG SCREEN, HOSP PERFORMED
Amphetamines: NOT DETECTED
BARBITURATES: NOT DETECTED
Benzodiazepines: POSITIVE — AB
COCAINE: NOT DETECTED
Opiates: NOT DETECTED
Tetrahydrocannabinol: NOT DETECTED

## 2017-05-29 LAB — URINALYSIS, ROUTINE W REFLEX MICROSCOPIC
BILIRUBIN URINE: NEGATIVE
Bacteria, UA: NONE SEEN
GLUCOSE, UA: NEGATIVE mg/dL
Hgb urine dipstick: NEGATIVE
KETONES UR: NEGATIVE mg/dL
LEUKOCYTES UA: NEGATIVE
Nitrite: NEGATIVE
PH: 5 (ref 5.0–8.0)
Protein, ur: 30 mg/dL — AB
Specific Gravity, Urine: 1.025 (ref 1.005–1.030)

## 2017-05-29 LAB — DIFFERENTIAL
BASOS PCT: 0 %
Basophils Absolute: 0 10*3/uL (ref 0.0–0.1)
EOS PCT: 1 %
Eosinophils Absolute: 0 10*3/uL (ref 0.0–0.7)
LYMPHS ABS: 1.5 10*3/uL (ref 0.7–4.0)
Lymphocytes Relative: 34 %
MONO ABS: 0.3 10*3/uL (ref 0.1–1.0)
Monocytes Relative: 7 %
NEUTROS ABS: 2.7 10*3/uL (ref 1.7–7.7)
Neutrophils Relative %: 58 %

## 2017-05-29 LAB — I-STAT CHEM 8, ED
BUN: 11 mg/dL (ref 6–20)
CALCIUM ION: 1.04 mmol/L — AB (ref 1.15–1.40)
Chloride: 109 mmol/L (ref 101–111)
Creatinine, Ser: 1.5 mg/dL — ABNORMAL HIGH (ref 0.61–1.24)
GLUCOSE: 112 mg/dL — AB (ref 65–99)
HCT: 35 % — ABNORMAL LOW (ref 39.0–52.0)
HEMOGLOBIN: 11.9 g/dL — AB (ref 13.0–17.0)
POTASSIUM: 3.8 mmol/L (ref 3.5–5.1)
SODIUM: 143 mmol/L (ref 135–145)
TCO2: 17 mmol/L — ABNORMAL LOW (ref 22–32)

## 2017-05-29 LAB — I-STAT TROPONIN, ED: TROPONIN I, POC: 0.01 ng/mL (ref 0.00–0.08)

## 2017-05-29 LAB — CBG MONITORING, ED: GLUCOSE-CAPILLARY: 115 mg/dL — AB (ref 65–99)

## 2017-05-29 LAB — CBC
HEMATOCRIT: 34.7 % — AB (ref 39.0–52.0)
Hemoglobin: 11.4 g/dL — ABNORMAL LOW (ref 13.0–17.0)
MCH: 23.2 pg — AB (ref 26.0–34.0)
MCHC: 32.9 g/dL (ref 30.0–36.0)
MCV: 70.7 fL — AB (ref 78.0–100.0)
PLATELETS: 166 10*3/uL (ref 150–400)
RBC: 4.91 MIL/uL (ref 4.22–5.81)
RDW: 14.3 % (ref 11.5–15.5)
WBC: 4.5 10*3/uL (ref 4.0–10.5)

## 2017-05-29 LAB — ETHANOL: ALCOHOL ETHYL (B): 51 mg/dL — AB (ref <10)

## 2017-05-29 LAB — APTT: aPTT: 26 seconds (ref 24–36)

## 2017-05-29 LAB — PROTIME-INR
INR: 1.47
PROTHROMBIN TIME: 17.7 s — AB (ref 11.4–15.2)

## 2017-05-29 MED ORDER — OXYCODONE-ACETAMINOPHEN 5-325 MG PO TABS
1.0000 | ORAL_TABLET | Freq: Three times a day (TID) | ORAL | Status: DC | PRN
  Administered 2017-05-29 – 2017-06-01 (×5): 1 via ORAL
  Filled 2017-05-29 (×5): qty 1

## 2017-05-29 MED ORDER — QUETIAPINE FUMARATE ER 50 MG PO TB24
150.0000 mg | ORAL_TABLET | Freq: Every day | ORAL | Status: DC
  Administered 2017-05-29 – 2017-05-30 (×2): 150 mg via ORAL
  Filled 2017-05-29 (×3): qty 3

## 2017-05-29 MED ORDER — ATORVASTATIN CALCIUM 10 MG PO TABS
10.0000 mg | ORAL_TABLET | Freq: Every evening | ORAL | Status: DC
  Administered 2017-05-29 – 2017-05-31 (×3): 10 mg via ORAL
  Filled 2017-05-29 (×4): qty 1

## 2017-05-29 MED ORDER — RISPERIDONE 2 MG PO TABS
2.0000 mg | ORAL_TABLET | Freq: Two times a day (BID) | ORAL | Status: DC
  Administered 2017-05-29 – 2017-05-31 (×4): 2 mg via ORAL
  Filled 2017-05-29 (×4): qty 1

## 2017-05-29 MED ORDER — IOPAMIDOL (ISOVUE-370) INJECTION 76%
INTRAVENOUS | Status: AC
  Administered 2017-05-29: 100 mL
  Filled 2017-05-29: qty 50

## 2017-05-29 MED ORDER — SODIUM CHLORIDE 0.9 % IV BOLUS (SEPSIS): 1000.0000 mL | Freq: Once | INTRAVENOUS | Status: DC

## 2017-05-29 MED ORDER — SENNOSIDES-DOCUSATE SODIUM 8.6-50 MG PO TABS: 1.0000 | ORAL_TABLET | Freq: Every evening | ORAL | Status: DC | PRN

## 2017-05-29 MED ORDER — ALPRAZOLAM 0.5 MG PO TABS: 0.5000 mg | ORAL_TABLET | Freq: Three times a day (TID) | ORAL | Status: DC | PRN

## 2017-05-29 MED ORDER — ONDANSETRON HCL 4 MG/2ML IJ SOLN: 4.0000 mg | Freq: Four times a day (QID) | INTRAMUSCULAR | Status: DC | PRN

## 2017-05-29 MED ORDER — ACETAMINOPHEN 325 MG PO TABS: 650.0000 mg | ORAL_TABLET | Freq: Four times a day (QID) | ORAL | Status: DC | PRN

## 2017-05-29 MED ORDER — ALLOPURINOL 100 MG PO TABS
100.0000 mg | ORAL_TABLET | Freq: Every day | ORAL | Status: DC
  Administered 2017-05-29 – 2017-06-01 (×4): 100 mg via ORAL
  Filled 2017-05-29 (×5): qty 1

## 2017-05-29 MED ORDER — ONDANSETRON HCL 4 MG PO TABS: 4.0000 mg | ORAL_TABLET | Freq: Four times a day (QID) | ORAL | Status: DC | PRN

## 2017-05-29 MED ORDER — LOSARTAN POTASSIUM 50 MG PO TABS
25.0000 mg | ORAL_TABLET | Freq: Every day | ORAL | Status: DC
  Filled 2017-05-29 (×2): qty 1

## 2017-05-29 MED ORDER — TETANUS-DIPHTH-ACELL PERTUSSIS 5-2.5-18.5 LF-MCG/0.5 IM SUSP: 0.5000 mL | Freq: Once | INTRAMUSCULAR | Status: DC

## 2017-05-29 MED ORDER — SODIUM CHLORIDE 0.9 % IV SOLN: INTRAVENOUS | Status: AC

## 2017-05-29 MED ORDER — APIXABAN 5 MG PO TABS
5.0000 mg | ORAL_TABLET | Freq: Two times a day (BID) | ORAL | Status: DC
  Administered 2017-05-29 – 2017-06-01 (×6): 5 mg via ORAL
  Filled 2017-05-29 (×7): qty 1

## 2017-05-29 MED ORDER — TRAZODONE HCL 100 MG PO TABS
300.0000 mg | ORAL_TABLET | Freq: Every day | ORAL | Status: DC
  Administered 2017-05-29 – 2017-05-31 (×3): 300 mg via ORAL
  Filled 2017-05-29 (×3): qty 3

## 2017-05-29 MED ORDER — ACETAMINOPHEN 650 MG RE SUPP: 650.0000 mg | Freq: Four times a day (QID) | RECTAL | Status: DC | PRN

## 2017-05-29 MED ORDER — DILTIAZEM HCL ER COATED BEADS 240 MG PO CP24
240.0000 mg | ORAL_CAPSULE | Freq: Every day | ORAL | Status: DC
  Administered 2017-05-29: 240 mg via ORAL
  Filled 2017-05-29 (×3): qty 1

## 2017-05-29 MED ORDER — SODIUM CHLORIDE 0.9 % IV BOLUS (SEPSIS)
1000.0000 mL | Freq: Once | INTRAVENOUS | Status: AC
  Administered 2017-05-29: 1000 mL via INTRAVENOUS

## 2017-05-29 MED ORDER — TETANUS-DIPHTH-ACELL PERTUSSIS 5-2.5-18.5 LF-MCG/0.5 IM SUSP
0.5000 mL | Freq: Once | INTRAMUSCULAR | Status: AC
  Administered 2017-05-29: 0.5 mL via INTRAMUSCULAR
  Filled 2017-05-29: qty 0.5

## 2017-05-29 NOTE — Evaluation (Signed)
Physical Therapy Evaluation Patient Details Name: Garrett Ferrell MRN: [id number] DOB: 02/11/1961 Today's Date: 05/29/2017   History of Present Illness  Garrett Ferrell is a 56 y.o. male with medical history significant for chronic atrial fibrillation on Eliquis, hypertension, traumatic brain injury, schizophrenia/bipolar disorder, depression, anxiety who presents on 05/29/2017 with unwitnessed fall.  Clinical Impression  Pt admitted with/for unwitnessed fall.  Pt undergoing work up to r/o stroke process.  Pt at supervion to independent level.  Pt currently limited functionally due to the problems listed below.  (see problems list.)  Pt will benefit from PT to maximize function and safety to be able to get home safely with available assist.     Follow Up Recommendations No PT follow up    Equipment Recommendations  None recommended by PT    Recommendations for Other Services       Precautions / Restrictions Precautions Precautions: Fall      Mobility  Bed Mobility Overal bed mobility: Needs Assistance Bed Mobility: Supine to Sit;Sit to Supine     Supine to sit: Supervision;Modified independent (Device/Increase time) Sit to supine: Supervision;Modified independent (Device/Increase time)   General bed mobility comments: slow to move through task  Transfers Overall transfer level: Needs assistance   Transfers: Sit to/from Stand Sit to Stand: Supervision         General transfer comment: no discernable deviation.  Ambulation/Gait Ambulation/Gait assistance: Supervision Ambulation Distance (Feet): 200 Feet Assistive device: None Gait Pattern/deviations: Step-through pattern   Gait velocity interpretation: at or above normal speed for age/gender General Gait Details: generally steady with a wide BOS gait.  After 150 feet L LE became more clumbsy, not extending fully.  But not overt deviation or LOB  Stairs            Wheelchair Mobility    Modified Rankin (Stroke  Patients Only)       Balance Overall balance assessment: Needs assistance   Sitting balance-Leahy Scale: Good       Standing balance-Leahy Scale: Good                               Pertinent Vitals/Pain Pain Assessment: Faces Faces Pain Scale: Hurts a little bit Pain Location: L foot pain with amb Pain Descriptors / Indicators: Guarding Pain Intervention(s): Monitored during session;Limited activity within patient's tolerance    Home Living Family/patient expects to be discharged to:: Private residence Living Arrangements: Spouse/significant other Available Help at Discharge: Family Type of Home: House Home Access: Level entry     Home Layout: One level Home Equipment: Crutches      Prior Function Level of Independence: Needs assistance   Gait / Transfers Assistance Needed: Independent ambulation.  Supervision for mild cognitive deficits  ADL's / Homemaking Assistance Needed: per pt he can do all ADLs        Hand Dominance        Extremity/Trunk Assessment   Upper Extremity Assessment Upper Extremity Assessment: Defer to OT evaluation    Lower Extremity Assessment Lower Extremity Assessment: RLE deficits/detail;LLE deficits/detail RLE Deficits / Details: grossly 4/5, slow to follow direction LLE Deficits / Details: grossly 4-to 4/5, slower to react       Communication   Communication: No difficulties  Cognition Arousal/Alertness: Awake/alert Behavior During Therapy: Flat affect Overall Cognitive Status: History of cognitive impairments - at baseline  General Comments      Exercises     Assessment/Plan    PT Assessment Patient needs continued PT services  PT Problem List Decreased strength;Decreased activity tolerance;Decreased mobility       PT Treatment Interventions Gait training;Functional mobility training;Therapeutic activities;Patient/family education    PT Goals  (Current goals can be found in the Care Plan section)  Acute Rehab PT Goals Patient Stated Goal: get home PT Goal Formulation: With patient Time For Goal Achievement: 07/10/17 Potential to Achieve Goals: Good    Frequency Min 3X/week   Barriers to discharge        Co-evaluation               AM-PAC PT "6 Clicks" Daily Activity  Outcome Measure Difficulty turning over in bed (including adjusting bedclothes, sheets and blankets)?: A Little Difficulty moving from lying on back to sitting on the side of the bed? : A Little Difficulty sitting down on and standing up from a chair with arms (e.g., wheelchair, bedside commode, etc,.)?: A Little Help needed moving to and from a bed to chair (including a wheelchair)?: None Help needed walking in hospital room?: None Help needed climbing 3-5 steps with a railing? : A Little 6 Click Score: 20    End of Session   Activity Tolerance: Patient tolerated treatment well Patient left: in bed;with call bell/phone within reach;with bed alarm set Nurse Communication: Mobility status PT Visit Diagnosis: Unsteadiness on feet (R26.81);Muscle weakness (generalized) (M62.81)    Time: [id number] PT Time Calculation (min) (ACUTE ONLY): 25 min   Charges:   PT Evaluation $PT Eval Moderate Complexity: 1 Mod PT Treatments $Gait Training: 8-22 mins   PT G Codes:   PT G-Codes **NOT FOR INPATIENT CLASS** Functional Assessment Tool Used: AM-PAC 6 Clicks Basic Mobility;Clinical judgement Functional Limitation: Mobility: Walking and moving around Mobility: Walking and Moving Around Current Status ([id number](G8978): At least 1 percent but less than 20 percent impaired, limited or restricted Mobility: Walking and Moving Around Goal Status [phone](G8979): 0 percent impaired, limited or restricted    05/29/2017  Park BingKen Ikran Patman, PT [phone]  (pager)  Garrett Ferrell 05/29/2017, 6:24 PM

## 2017-05-29 NOTE — ED Provider Notes (Signed)
[facility] EMERGENCY DEPARTMENT Provider Note   CSN: [id number] Arrival date & time: 05/29/17  1144     History   Chief Complaint Chief Complaint  Patient presents with  . Code Stroke    HPI Garrett Ferrell is a 56 y.o. male. Patient stood up this morning 10:45 AM to urinate and suffered near syncopal event.  He became lightheaded.  EMS reported that he had left-sided weakness.  He also had symmetry in the 60s on room air.  He is presently feeling improved.  He was brought to the emergency department as a code stroke.  Treated in the field by EMS with oxygen via nonrebreather mask.  He presently denies any shortness of breath.  He complains of mild left thigh pain which feels like "a muscle cramp" no other associated symptoms no headache no focal numbness or weakness.  He admits to drinking copious amounts of alcohol yesterday HPI Patient Past Medical History:  Diagnosis Date  . Anemia    takes Iron pill daily  . Anxiety    takes Xanax daily as needed  . Arthritis   . Atrial fibrillation (HCC)    takes Eliquis daily. Last dose will be 10/03/16  . Depression    takes Risperdal daily  . DVT (deep venous thrombosis) (HCC)    both legs in 2016  . Dyspnea    when laying on back  . Gout    takes Allopurinol daily  . Headache    occasionally  . History of shingles 2010  . Hyperlipidemia    takes Atorvastatin daily  . Hyperlipidemia    takes Atorvastatin daily  . Hypertension    takes Metoprolol daily  . Insomnia    takes Ambien and Trazodone nightly  . Joint pain   . Joint swelling    right ankle  . 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 Nocturia   . Seizures (HCC)    last one in 2002    Patient Active Problem List   Diagnosis Date Noted  . Chest pain 08/07/2016  . Cocaine use 08/07/2016  . Chronic musculoskeletal pain 08/07/2016  . CKD (chronic kidney disease) 08/07/2016  . Depression with anxiety 08/07/2016  . Traumatic arthritis of right  ankle 07/31/2016  . 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 Osteoarthritis of right subtalar joint   . Right ankle pain 01/24/2016  . Bilateral hip pain 01/03/2016  . Atrial fibrillation (HCC) 11/18/2015  . Essential hypertension 11/18/2015  . Hyperlipidemia 11/18/2015  . DVT, bilateral lower limbs (HCC) 11/18/2015  . Pulmonary embolus (HCC) 11/18/2015    Past Surgical History:  Procedure Laterality Date  . ANKLE ARTHROSCOPY Right 04/20/2016   Procedure: ANKLE ARTHROSCOPY;  Surgeon: Nadara Mustard, MD;  Location: [facility] OR;  Service: Orthopedics;  Laterality: Right;  . ANKLE ARTHROSCOPY WITH FUSION Right 10/05/2016   Procedure: RIGHT ANKLE ARTHROSCOPIC FUSION;  Surgeon: Nadara Mustard, MD;  Location: [facility] OR;  Service: Orthopedics;  Laterality: Right;  . ANKLE FUSION Right 04/20/2016   Procedure: Right Posterior Arthroscopic Subtalar Arthrodesis;  Surgeon: Nadara Mustard, MD;  Location: [facility] OR;  Service: Orthopedics;  Laterality: Right;  . ANKLE SURGERY Right    plates/screws  . COLONOSCOPY WITH PROPOFOL N/A 11/27/2016   Procedure: COLONOSCOPY WITH PROPOFOL;  Surgeon: Charolett Bumpers, MD;  Location: [facility] ENDOSCOPY;  Service: Endoscopy;  Laterality: N/A;  . HERNIA REPAIR     1986-umbilical  . RIGHT HEART CATH  2016  at [facility] during his DVT/PE       Home Medications    Prior to Admission medications   Medication Sig Start Date End Date Taking? Authorizing Provider  allopurinol (ZYLOPRIM) 100 MG tablet Take 100 mg by mouth daily. 12/22/15   [provider]  ALPRAZolam Prudy Feeler) 0.5 MG tablet Take 0.5 mg by mouth every 8 (eight) hours as needed for anxiety.  10/21/15   [provider]  atorvastatin (LIPITOR) 10 MG tablet Take 10 mg by mouth every evening.     [provider]  diltiazem (CARDIZEM CD) 240 MG 24 hr capsule Take 1 capsule (240 mg total) by mouth daily. 08/09/16   Joseph Art, DO  ELIQUIS 5 MG TABS tablet Take 1  tablet (5 mg total) by mouth 2 (two) times daily. 09/05/16   Azalee Course, PA  ferrous sulfate 325 (65 FE) MG EC tablet Take 325 mg by mouth daily with breakfast.    [provider]  losartan (COZAAR) 25 MG tablet Take 25 mg by mouth daily. 09/24/16   [provider]  meloxicam (MOBIC) 15 MG tablet Take 1 tablet (15 mg total) by mouth daily. 01/09/16   Myra Rude, MD  oxyCODONE-acetaminophen (ROXICET) 5-325 MG tablet Take 1 tablet by mouth every 8 (eight) hours as needed for severe pain. 02/07/17   Nadara Mustard, MD  risperiDONE (RISPERDAL) 1 MG tablet Take 2 mg by mouth 2 (two) times daily.  12/23/15   [provider]  SEROQUEL XR 150 MG 24 hr tablet Take 150 mg by mouth at bedtime. 12/23/15   [provider]  tiZANidine (ZANAFLEX) 2 MG tablet Take 2-4 mg by mouth every 8 (eight) hours as needed for muscle spasms.  06/02/16   [provider]  trazodone (DESYREL) 300 MG tablet Take 300 mg by mouth at bedtime. 10/24/15   [provider]  VIAGRA 100 MG tablet Take 100 mg by mouth daily as needed for erectile dysfunction.  09/30/15   [provider]  zolpidem (AMBIEN) 10 MG tablet Take 10 mg by mouth at bedtime.     [provider]    Family History Family History  Problem Relation Age of Onset  . Heart Problems Mother   . Heart attack Father        age 61s    Social History Social History   Tobacco Use  . Smoking status: Never Smoker  . Smokeless tobacco: Never Used  Substance Use Topics  . Alcohol use: Yes    Alcohol/week: 0.0 oz    Comment: one month ago- 09/2016  . Drug use: No     Allergies   Patient has no known allergies.   Review of Systems Review of Systems  HENT: Negative.   Respiratory: Negative.   Cardiovascular: Negative.         nearSyncope  Gastrointestinal: Negative.   Musculoskeletal: Negative.   Skin: Negative.   Neurological: Positive for weakness.       Generalized weakness    Hematological: Bruises/bleeds easily.  Psychiatric/Behavioral: Negative.   All other systems reviewed and are negative.    Physical Exam Updated Vital Signs There were no vitals taken for this visit.  Physical Exam  Constitutional: He is oriented to person, place, and time. He appears well-developed and well-nourished.  HENT:  Head: Normocephalic and atraumatic.  Eyes: Conjunctivae are normal. Pupils are equal, round, and reactive to light.  Neck: Neck supple. No tracheal deviation present. No  thyromegaly present.  Cardiovascular: Normal rate and regular rhythm.  No murmur heard. Pulmonary/Chest: Effort normal and breath sounds normal.  Abdominal: Soft. Bowel sounds are normal. He exhibits no distension. There is no tenderness.  Musculoskeletal: Normal range of motion. He exhibits no edema or tenderness.  Tired spine nontender.  Pelvis stable nontender.  All 4 extremities without deformity or swelling or bony tenderness, neurovascularly intact  Neurological: He is alert and oriented to person, place, and time. Coordination normal.  Is all extremities well cranial nerves II through XII grossly intact DTR symmetric bilaterally at knee jerk ankle jerk and biceps was overgrown bilaterally  Skin: Skin is warm and dry. No rash noted.  Dime sized abrasion at lateral aspect of left lower leg no swelling.  No active bleeding  Psychiatric: He has a normal mood and affect.  Nursing note and vitals reviewed.    ED Treatments / Results  Labs (all labs ordered are listed, but only abnormal results are displayed) Labs Reviewed  ETHANOL - Abnormal; Notable for the following components:      Result Value   Alcohol, Ethyl (B) 51 (*)    All other components within normal limits  PROTIME-INR - Abnormal; Notable for the following components:   Prothrombin Time 17.7 (*)    All other components within normal limits  CBC - Abnormal; Notable for the following components:   Hemoglobin 11.4 (*)     HCT 34.7 (*)    MCV 70.7 (*)    MCH 23.2 (*)    All other components within normal limits  COMPREHENSIVE METABOLIC PANEL - Abnormal; Notable for the following components:   CO2 17 (*)    Glucose, Bld 119 (*)    Creatinine, Ser 1.45 (*)    Calcium 8.7 (*)    Albumin 3.1 (*)    GFR calc non Af Amer 53 (*)    Anion gap 17 (*)    All other components within normal limits  CBG MONITORING, ED - Abnormal; Notable for the following components:   Glucose-Capillary 115 (*)    All other components within normal limits  I-STAT CHEM 8, ED - Abnormal; Notable for the following components:   Creatinine, Ser 1.50 (*)    Glucose, Bld 112 (*)    Calcium, Ion 1.04 (*)    TCO2 17 (*)    Hemoglobin 11.9 (*)    HCT 35.0 (*)    All other components within normal limits  APTT  DIFFERENTIAL  RAPID URINE DRUG SCREEN, HOSP PERFORMED  URINALYSIS, ROUTINE W REFLEX MICROSCOPIC  I-STAT TROPONIN, ED    EKG  EKG Interpretation  Date/Time:  Wednesday May 29 2017 12:36:12 EST Ventricular Rate:  99 PR Interval:    QRS Duration: 148 QT Interval:  382 QTC Calculation: 478 R Axis:   -30 Text Interpretation:  Atrial fibrillation Right bundle branch block SINCE LAST TRACING HEART RATE HAS INCREASED Confirmed by Doug Sou [phone]) on 05/29/2017 12:50:32 PM       Radiology Ct Angio Head W Or Wo Contrast  Result Date: 05/29/2017 CLINICAL DATA:  56 year old male code stroke. Left side weakness and slurred speech. EXAM: CT ANGIOGRAPHY HEAD AND NECK TECHNIQUE: Multidetector CT imaging of the head and neck was performed using the standard protocol during bolus administration of intravenous contrast. Multiplanar CT image reconstructions and MIPs were obtained to evaluate the vascular anatomy. Carotid stenosis measurements (when applicable) are obtained utilizing NASCET criteria, using the distal internal carotid diameter as the denominator. CONTRAST:   ISOVUE-370 IOPAMIDOL (ISOVUE-370) INJECTION 76%  COMPARISON:  Head CT without contrast 1202 hours today. FINDINGS: CTA NECK Skeleton: No acute osseous abnormality identified. Visualized paranasal sinuses and mastoids are stable and well pneumatized. Upper chest: Negative visualized upper lungs. Negative visualized superior mediastinum aside from mild lipomatosis. Partially visible subpectoral implants. Other neck: Negative.  No cervical lymphadenopathy. Aortic arch: No aortic arch atherosclerosis or great vessel origin stenosis. Incidental distal location of the left subclavian origin from the arch (series 10, image 136). Right carotid system: Negative brachiocephalic artery and right CCA origin. Negative right CCA. Minimal soft atherosclerotic plaque at the anterior right carotid bifurcation near the ECA origin. Widely patent right ICA in the neck to the skullbase. Left carotid system: Negative left CCA aside from mild tortuosity. Negative left carotid bifurcation and cervical left ICA. Vertebral arteries: Normal proximal right subclavian artery. Normal right vertebral artery origin. Mild obscuration of some of the right V1 segment related to paravertebral venous contrast. The right vertebral artery appears normal to the skullbase. Normal left CCA origin aside from somewhat distal positioning from the arch. Subsequently there is mild tortuosity of the proximal left subclavian. Normal left vertebral artery origin. Mildly non dominant left vertebral artery is normal to the skullbase. CTA HEAD Posterior circulation: Normal distal vertebral arteries. Patent PICA origins. Mildly fenestrated vertebrobasilar junction (normal variant) without stenosis. Patent basilar artery without stenosis. Normal SCA and PCA origins. Posterior communicating arteries are diminutive or absent. Bilateral PCA branches are within normal limits. Anterior circulation: Patent ICA siphons. Minimal calcified plaque on the left. No siphon stenosis. Normal ophthalmic artery origins. Patent carotid  termini. Normal MCA and ACA origins. Mildly tortuous A1 segments. Diminutive or absent anterior communicating artery. Bilateral ACA branches are within normal limits. Left MCA M1 segment, left MCA bifurcation, and visible left MCA branches are within normal limits. Slightly early arterial enhancement timing at the MCA branches. Right MCA M1 segment, right MCA bifurcation, and visible right MCA branches are within normal limits. Venous sinuses: Early contrast timing, but there is early enhancement of the major dural venous sinuses suggesting patency. Anatomic variants: Mildly dominant right vertebral artery. Fenestrated vertebrobasilar junction. Somewhat distal origin of the left subclavian artery from the aortic arch. Review of the MIP images confirms the above findings IMPRESSION: 1. Negative for emergent large vessel occlusion. 2. Somewhat early contrast timing for the bilateral distal MCA branches. No MCA branch occlusion is identified. 3. Only minimal atherosclerosis identified in the head and neck. No arterial stenosis identified. A preliminary report of the above was communicated to Dr. Pearlean BrownieSethi at 1234 hours on 12/26/2018by text page via the [facility] messaging system. Electronically Signed   By: Odessa FlemingH  Hall M.D.   On: 05/29/2017 12:42   Ct Angio Neck W Or Wo Contrast  Result Date: 05/29/2017 CLINICAL DATA:  56 year old male code stroke. Left side weakness and slurred speech. EXAM: CT ANGIOGRAPHY HEAD AND NECK TECHNIQUE: Multidetector CT imaging of the head and neck was performed using the standard protocol during bolus administration of intravenous contrast. Multiplanar CT image reconstructions and MIPs were obtained to evaluate the vascular anatomy. Carotid stenosis measurements (when applicable) are obtained utilizing NASCET criteria, using the distal internal carotid diameter as the denominator. CONTRAST:  100mL ISOVUE-370 IOPAMIDOL (ISOVUE-370) INJECTION 76% COMPARISON:  Head CT without contrast 1202 hours  today. FINDINGS: CTA NECK Skeleton: No acute osseous abnormality identified. Visualized paranasal sinuses and mastoids are stable and well pneumatized. Upper chest: Negative visualized upper lungs. Negative visualized superior mediastinum aside from  mild lipomatosis. Partially visible subpectoral implants. Other neck: Negative.  No cervical lymphadenopathy. Aortic arch: No aortic arch atherosclerosis or great vessel origin stenosis. Incidental distal location of the left subclavian origin from the arch (series 10, image 136). Right carotid system: Negative brachiocephalic artery and right CCA origin. Negative right CCA. Minimal soft atherosclerotic plaque at the anterior right carotid bifurcation near the ECA origin. Widely patent right ICA in the neck to the skullbase. Left carotid system: Negative left CCA aside from mild tortuosity. Negative left carotid bifurcation and cervical left ICA. Vertebral arteries: Normal proximal right subclavian artery. Normal right vertebral artery origin. Mild obscuration of some of the right V1 segment related to paravertebral venous contrast. The right vertebral artery appears normal to the skullbase. Normal left CCA origin aside from somewhat distal positioning from the arch. Subsequently there is mild tortuosity of the proximal left subclavian. Normal left vertebral artery origin. Mildly non dominant left vertebral artery is normal to the skullbase. CTA HEAD Posterior circulation: Normal distal vertebral arteries. Patent PICA origins. Mildly fenestrated vertebrobasilar junction (normal variant) without stenosis. Patent basilar artery without stenosis. Normal SCA and PCA origins. Posterior communicating arteries are diminutive or absent. Bilateral PCA branches are within normal limits. Anterior circulation: Patent ICA siphons. Minimal calcified plaque on the left. No siphon stenosis. Normal ophthalmic artery origins. Patent carotid termini. Normal MCA and ACA origins. Mildly  tortuous A1 segments. Diminutive or absent anterior communicating artery. Bilateral ACA branches are within normal limits. Left MCA M1 segment, left MCA bifurcation, and visible left MCA branches are within normal limits. Slightly early arterial enhancement timing at the MCA branches. Right MCA M1 segment, right MCA bifurcation, and visible right MCA branches are within normal limits. Venous sinuses: Early contrast timing, but there is early enhancement of the major dural venous sinuses suggesting patency. Anatomic variants: Mildly dominant right vertebral artery. Fenestrated vertebrobasilar junction. Somewhat distal origin of the left subclavian artery from the aortic arch. Review of the MIP images confirms the above findings IMPRESSION: 1. Negative for emergent large vessel occlusion. 2. Somewhat early contrast timing for the bilateral distal MCA branches. No MCA branch occlusion is identified. 3. Only minimal atherosclerosis identified in the head and neck. No arterial stenosis identified. A preliminary report of the above was communicated to Dr. Pearlean Brownie at 1234 hours on 12/26/2018by text page via the [facility] messaging system. Electronically Signed   By: Odessa Fleming M.D.   On: 05/29/2017 12:42   Ct Head Code Stroke Wo Contrast  Result Date: 05/29/2017 CLINICAL DATA:  Code stroke. Left-sided weakness and slurred speech. EXAM: CT HEAD WITHOUT CONTRAST TECHNIQUE: Contiguous axial images were obtained from the base of the skull through the vertex without intravenous contrast. COMPARISON:  None. FINDINGS: Brain: There is no evidence of acute infarct, intracranial hemorrhage, mass, midline shift, or extra-axial fluid collection. The ventricles and sulci are normal. Vascular: Mild calcified atherosclerosis in the carotid siphons. No hyperdense vessel. Skull: No fracture or focal osseous lesion. Sinuses/Orbits: The visualized paranasal sinuses and mastoid air cells are clear. Cerumen is noted in the external auditory  canals. The orbits are unremarkable. Other: None. ASPECTS [facility] Stroke Program Early CT Score) - Ganglionic level infarction (caudate, lentiform nuclei, internal capsule, insula, M1-M3 cortex): 7 - Supraganglionic infarction (M4-M6 cortex): 3 Total score (0-10 with 10 being normal): 10 IMPRESSION: 1. No evidence of acute intracranial abnormality. Unremarkable CT appearance of the brain. 2. ASPECTS is 10. These results were communicated to Dr. Pearlean Brownie at 12:16 pmon 05/29/2017 by  text page via the [facility] messaging system. Electronically Signed   By: Sebastian AcheAllen  Grady M.D.   On: 05/29/2017 12:16    Procedures Procedures (including critical care time)  Medications Ordered in ED Medications  Tdap (BOOSTRIX) injection 0.5 mL (not administered)  sodium chloride 0.9 % bolus 1,000 mL (not administered)  iopamidol (ISOVUE-370) 76 % injection (100 mLs  Contrast Given 05/29/17 1216)    Results for orders placed or performed during the hospital encounter of 05/29/17  Ethanol  Result Value Ref Range   Alcohol, Ethyl (B) 51 (H) <10 mg/dL  Protime-INR  Result Value Ref Range   Prothrombin Time 17.7 (H) 11.4 - 15.2 seconds   INR 1.47   APTT  Result Value Ref Range   aPTT 26 24 - 36 seconds  CBC  Result Value Ref Range   WBC 4.5 4.0 - 10.5 K/uL   RBC 4.91 4.22 - 5.81 MIL/uL   Hemoglobin 11.4 (L) 13.0 - 17.0 g/dL   HCT 16.134.7 (L) 09.639.0 - 04.552.0 %   MCV 70.7 (L) 78.0 - 100.0 fL   MCH 23.2 (L) 26.0 - 34.0 pg   MCHC 32.9 30.0 - 36.0 g/dL   RDW 40.914.3 81.111.5 - 91.415.5 %   Platelets 166 150 - 400 K/uL  Differential  Result Value Ref Range   Neutrophils Relative % 58 %   Lymphocytes Relative 34 %   Monocytes Relative 7 %   Eosinophils Relative 1 %   Basophils Relative 0 %   Neutro Abs 2.7 1.7 - 7.7 K/uL   Lymphs Abs 1.5 0.7 - 4.0 K/uL   Monocytes Absolute 0.3 0.1 - 1.0 K/uL   Eosinophils Absolute 0.0 0.0 - 0.7 K/uL   Basophils Absolute 0.0 0.0 - 0.1 K/uL   RBC Morphology TARGET CELLS   Comprehensive metabolic  panel  Result Value Ref Range   Sodium 142 135 - 145 mmol/L   Potassium 3.9 3.5 - 5.1 mmol/L   Chloride 108 101 - 111 mmol/L   CO2 17 (L) 22 - 32 mmol/L   Glucose, Bld 119 (H) 65 - 99 mg/dL   BUN 11 6 - 20 mg/dL   Creatinine, Ser 7.821.45 (H) 0.61 - 1.24 mg/dL   Calcium 8.7 (L) 8.9 - 10.3 mg/dL   Total Protein 6.5 6.5 - 8.1 g/dL   Albumin 3.1 (L) 3.5 - 5.0 g/dL   AST 28 15 - 41 U/L   ALT 18 17 - 63 U/L   Alkaline Phosphatase 49 38 - 126 U/L   Total Bilirubin 0.6 0.3 - 1.2 mg/dL   GFR calc non Af Amer 53 (L) >60 mL/min   GFR calc Af Amer >60 >60 mL/min   Anion gap 17 (H) 5 - 15  CBG monitoring, ED  Result Value Ref Range   Glucose-Capillary 115 (H) 65 - 99 mg/dL   Comment 1 Notify RN    Comment 2 Document in Chart   I-Stat Chem 8, ED  Result Value Ref Range   Sodium 143 135 - 145 mmol/L   Potassium 3.8 3.5 - 5.1 mmol/L   Chloride 109 101 - 111 mmol/L   BUN 11 6 - 20 mg/dL   Creatinine, Ser 9.561.50 (H) 0.61 - 1.24 mg/dL   Glucose, Bld 213112 (H) 65 - 99 mg/dL   Calcium, Ion 0.861.04 (L) 1.15 - 1.40 mmol/L   TCO2 17 (L) 22 - 32 mmol/L   Hemoglobin 11.9 (L) 13.0 - 17.0 g/dL   HCT 57.835.0 (L) 46.939.0 - 62.952.0 %  I-stat troponin, ED  Result Value Ref Range   Troponin i, poc 0.01 0.00 - 0.08 ng/mL   Comment 3           Ct Angio Head W Or Wo Contrast  Result Date: 05/29/2017 CLINICAL DATA:  56 year old male code stroke. Left side weakness and slurred speech. EXAM: CT ANGIOGRAPHY HEAD AND NECK TECHNIQUE: Multidetector CT imaging of the head and neck was performed using the standard protocol during bolus administration of intravenous contrast. Multiplanar CT image reconstructions and MIPs were obtained to evaluate the vascular anatomy. Carotid stenosis measurements (when applicable) are obtained utilizing NASCET criteria, using the distal internal carotid diameter as the denominator. CONTRAST:  ISOVUE-370 IOPAMIDOL (ISOVUE-370) INJECTION 76% COMPARISON:  Head CT without contrast 1202 hours today.  FINDINGS: CTA NECK Skeleton: No acute osseous abnormality identified. Visualized paranasal sinuses and mastoids are stable and well pneumatized. Upper chest: Negative visualized upper lungs. Negative visualized superior mediastinum aside from mild lipomatosis. Partially visible subpectoral implants. Other neck: Negative.  No cervical lymphadenopathy. Aortic arch: No aortic arch atherosclerosis or great vessel origin stenosis. Incidental distal location of the left subclavian origin from the arch (series 10, image 136). Right carotid system: Negative brachiocephalic artery and right CCA origin. Negative right CCA. Minimal soft atherosclerotic plaque at the anterior right carotid bifurcation near the ECA origin. Widely patent right ICA in the neck to the skullbase. Left carotid system: Negative left CCA aside from mild tortuosity. Negative left carotid bifurcation and cervical left ICA. Vertebral arteries: Normal proximal right subclavian artery. Normal right vertebral artery origin. Mild obscuration of some of the right V1 segment related to paravertebral venous contrast. The right vertebral artery appears normal to the skullbase. Normal left CCA origin aside from somewhat distal positioning from the arch. Subsequently there is mild tortuosity of the proximal left subclavian. Normal left vertebral artery origin. Mildly non dominant left vertebral artery is normal to the skullbase. CTA HEAD Posterior circulation: Normal distal vertebral arteries. Patent PICA origins. Mildly fenestrated vertebrobasilar junction (normal variant) without stenosis. Patent basilar artery without stenosis. Normal SCA and PCA origins. Posterior communicating arteries are diminutive or absent. Bilateral PCA branches are within normal limits. Anterior circulation: Patent ICA siphons. Minimal calcified plaque on the left. No siphon stenosis. Normal ophthalmic artery origins. Patent carotid termini. Normal MCA and ACA origins. Mildly tortuous A1  segments. Diminutive or absent anterior communicating artery. Bilateral ACA branches are within normal limits. Left MCA M1 segment, left MCA bifurcation, and visible left MCA branches are within normal limits. Slightly early arterial enhancement timing at the MCA branches. Right MCA M1 segment, right MCA bifurcation, and visible right MCA branches are within normal limits. Venous sinuses: Early contrast timing, but there is early enhancement of the major dural venous sinuses suggesting patency. Anatomic variants: Mildly dominant right vertebral artery. Fenestrated vertebrobasilar junction. Somewhat distal origin of the left subclavian artery from the aortic arch. Review of the MIP images confirms the above findings IMPRESSION: 1. Negative for emergent large vessel occlusion. 2. Somewhat early contrast timing for the bilateral distal MCA branches. No MCA branch occlusion is identified. 3. Only minimal atherosclerosis identified in the head and neck. No arterial stenosis identified. A preliminary report of the above was communicated to Dr. Pearlean Brownie at 1234 hours on 12/26/2018by text page via the [facility] messaging system. Electronically Signed   By: Odessa Fleming M.D.   On: 05/29/2017 12:42   Ct Angio Neck W Or Wo Contrast  Result Date: 05/29/2017 CLINICAL DATA:  56 year old male code stroke. Left side weakness and slurred speech. EXAM: CT ANGIOGRAPHY HEAD AND NECK TECHNIQUE: Multidetector CT imaging of the head and neck was performed using the standard protocol during bolus administration of intravenous contrast. Multiplanar CT image reconstructions and MIPs were obtained to evaluate the vascular anatomy. Carotid stenosis measurements (when applicable) are obtained utilizing NASCET criteria, using the distal internal carotid diameter as the denominator. CONTRAST:  ISOVUE-370 IOPAMIDOL (ISOVUE-370) INJECTION 76% COMPARISON:  Head CT without contrast 1202 hours today. FINDINGS: CTA NECK Skeleton: No acute osseous  abnormality identified. Visualized paranasal sinuses and mastoids are stable and well pneumatized. Upper chest: Negative visualized upper lungs. Negative visualized superior mediastinum aside from mild lipomatosis. Partially visible subpectoral implants. Other neck: Negative.  No cervical lymphadenopathy. Aortic arch: No aortic arch atherosclerosis or great vessel origin stenosis. Incidental distal location of the left subclavian origin from the arch (series 10, image 136). Right carotid system: Negative brachiocephalic artery and right CCA origin. Negative right CCA. Minimal soft atherosclerotic plaque at the anterior right carotid bifurcation near the ECA origin. Widely patent right ICA in the neck to the skullbase. Left carotid system: Negative left CCA aside from mild tortuosity. Negative left carotid bifurcation and cervical left ICA. Vertebral arteries: Normal proximal right subclavian artery. Normal right vertebral artery origin. Mild obscuration of some of the right V1 segment related to paravertebral venous contrast. The right vertebral artery appears normal to the skullbase. Normal left CCA origin aside from somewhat distal positioning from the arch. Subsequently there is mild tortuosity of the proximal left subclavian. Normal left vertebral artery origin. Mildly non dominant left vertebral artery is normal to the skullbase. CTA HEAD Posterior circulation: Normal distal vertebral arteries. Patent PICA origins. Mildly fenestrated vertebrobasilar junction (normal variant) without stenosis. Patent basilar artery without stenosis. Normal SCA and PCA origins. Posterior communicating arteries are diminutive or absent. Bilateral PCA branches are within normal limits. Anterior circulation: Patent ICA siphons. Minimal calcified plaque on the left. No siphon stenosis. Normal ophthalmic artery origins. Patent carotid termini. Normal MCA and ACA origins. Mildly tortuous A1 segments. Diminutive or absent anterior  communicating artery. Bilateral ACA branches are within normal limits. Left MCA M1 segment, left MCA bifurcation, and visible left MCA branches are within normal limits. Slightly early arterial enhancement timing at the MCA branches. Right MCA M1 segment, right MCA bifurcation, and visible right MCA branches are within normal limits. Venous sinuses: Early contrast timing, but there is early enhancement of the major dural venous sinuses suggesting patency. Anatomic variants: Mildly dominant right vertebral artery. Fenestrated vertebrobasilar junction. Somewhat distal origin of the left subclavian artery from the aortic arch. Review of the MIP images confirms the above findings IMPRESSION: 1. Negative for emergent large vessel occlusion. 2. Somewhat early contrast timing for the bilateral distal MCA branches. No MCA branch occlusion is identified. 3. Only minimal atherosclerosis identified in the head and neck. No arterial stenosis identified. A preliminary report of the above was communicated to Dr. Pearlean Brownie at 1234 hours on 12/26/2018by text page via the [facility] messaging system. Electronically Signed   By: Odessa Fleming M.D.   On: 05/29/2017 12:42   Ct Head Code Stroke Wo Contrast  Result Date: 05/29/2017 CLINICAL DATA:  Code stroke. Left-sided weakness and slurred speech. EXAM: CT HEAD WITHOUT CONTRAST TECHNIQUE: Contiguous axial images were obtained from the base of the skull through the vertex without intravenous contrast. COMPARISON:  None. FINDINGS: Brain: There is no evidence of acute infarct, intracranial hemorrhage, mass, midline shift,  or extra-axial fluid collection. The ventricles and sulci are normal. Vascular: Mild calcified atherosclerosis in the carotid siphons. No hyperdense vessel. Skull: No fracture or focal osseous lesion. Sinuses/Orbits: The visualized paranasal sinuses and mastoid air cells are clear. Cerumen is noted in the external auditory canals. The orbits are unremarkable. Other: None. ASPECTS  [facility] Stroke Program Early CT Score) - Ganglionic level infarction (caudate, lentiform nuclei, internal capsule, insula, M1-M3 cortex): 7 - Supraganglionic infarction (M4-M6 cortex): 3 Total score (0-10 with 10 being normal): 10 IMPRESSION: 1. No evidence of acute intracranial abnormality. Unremarkable CT appearance of the brain. 2. ASPECTS is 10. These results were communicated to Dr. Pearlean Brownie at 12:16 pmon 05/29/2017 by text page via the [facility] messaging system. Electronically Signed   By: Sebastian Ache M.D.   On: 05/29/2017 12:16   Initial Impression / Assessment and Plan / ED Course  I have reviewed the triage vital signs and the nursing notes.  Pertinent labs & imaging results that were available during my care of the patient were reviewed by me and considered in my medical decision making (see chart for details).      Dr Pearlean Brownie from stroke service evaluate patient in the emergency department and reports that if the CT angios of head and neck unremarkable patient should be observed overnight by medical service.  He will follow the patient while in hospital.  He is noted to have mild anion gap metabolic acidosis likely secondary to alcoholic ketoacidosis versus lactic acidosis.  Intravenous hydration ordered.  I have consulted Dr.Netty hospitalist service who will arrange for overnight observation Final Clinical Impressions(s) / ED Diagnoses  Dx #1 near syncope #2 metabolic acidosis Final diagnoses:  None    ED Discharge Orders    None       Doug Sou, MD 05/29/17 1342

## 2017-05-29 NOTE — ED Notes (Signed)
Pt arrived via [facility] from home where wife reports that she heard pt fall today after going to [facility], wife also reports slured speech. Upon EMS arrival, pt was found to be 88% RA and placed on nonrebreather, brought pt up to 97%. EMS also reports left sided weakness. Hx TBI. CBG 147 for EMS. Pt is on eliquis.

## 2017-05-29 NOTE — Consult Note (Addendum)
Reason for Consult:Code Stroke Referring Physician: Dr Doreen Beam is an 56 y.o. male.  HPI:  [name] is a 56 year old African-American male who is brought in today by EMS as code stroke. History is obtained from the patient, his wife whom I spoke over the phone and EMS. The patient apparently was fine last night when he went to sleep. He states he woke up around 1045 and was able to walk to the restroom without problems. He stated that he was unable to be despite his effort and felt that his legs were becoming weak and he fell down. States he did not lose consciousness. His wife was sleeping in the bedroom noticed a loud sound and came by and found the patient to be responsive but slow to respond and able to speak very slowly. She did not notice any slurred speech or facial weakness or extremity weakness. However when EMS arrived they noticed he had left-sided weakness and slurred speech. His blood pressure was found to be low at 90/60. His oxygen saturation also slightly low. The patient upon arrival also complained of crampy-like pain in his left leg and on exam he has mild weakness of the left leg but otherwise a nonfocal exam. Patient is on eliquis for DVT and atrial fibrillation. Emergent CT scan of the head showed no acute abnormality. CT angiogram of the brain and neck was also obtained which showed no large vessel occlusion or high-grade stenosis in the brain of the neck. 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 As per his wife patient is fairly functional but does need supervision at home  14 system Review of systems positive for leg weakness, fall, leg pain and cramp and all other systems negative Past Medical History:  Diagnosis Date  . Anemia    takes Iron pill daily   . Anxiety    takes Xanax daily as needed  . Arthritis   . Atrial fibrillation ([place])    takes Eliquis daily. Last dose will be 10/03/16  . Depression    takes Risperdal daily  . DVT (deep venous thrombosis) ([place])    both legs in 2016  . Dyspnea    when laying on back  . Gout    takes Allopurinol daily  . Headache    occasionally  . History of shingles 2010  . Hyperlipidemia    takes Atorvastatin daily  . Hyperlipidemia    takes Atorvastatin daily  . Hypertension    takes Metoprolol daily  . Insomnia    takes Ambien and Trazodone nightly  . Joint pain   . Joint swelling    right ankle  . 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 Nocturia   . Seizures ([place])    last one in 2002    Past Surgical History:  Procedure Laterality Date  . ANKLE ARTHROSCOPY Right 04/20/2016   Procedure: ANKLE ARTHROSCOPY;  Surgeon: [name], MD;  Location: [facility];  Service: Orthopedics;  Laterality: Right;  . ANKLE ARTHROSCOPY WITH FUSION Right 10/05/2016   Procedure: RIGHT ANKLE ARTHROSCOPIC FUSION;  Surgeon: [name], MD;  Location: [place];  Service: Orthopedics;  Laterality: Right;  . ANKLE FUSION Right 04/20/2016   Procedure: Right Posterior Arthroscopic Subtalar Arthrodesis;  Surgeon: [name], MD;  Location: [facility];  Service: Orthopedics;  Laterality: Right;  . ANKLE SURGERY Right    plates/screws  . COLONOSCOPY WITH PROPOFOL N/A 11/27/2016   Procedure: COLONOSCOPY WITH PROPOFOL;  Surgeon: Garlan Fair, MD;  Location: [facility];  Service: Endoscopy;  Laterality: N/A;  . HERNIA REPAIR     1986-umbilical  . RIGHT HEART CATH  2016   at [facility] during his DVT/PE    Family History  Problem Relation Age of Onset  . Heart Problems Mother   . Heart attack Father        age 57s    Social History:  reports that  has never smoked. he has never used smokeless tobacco. He reports that he drinks alcohol. He reports that he does not use  drugs.  Allergies: No Known Allergies  Medications: I have reviewed the patient's current medications.  Results for orders placed or performed during the hospital encounter of 05/29/17 (from the past 48 hour(s))  CBG monitoring, ED     Status: Abnormal   Collection Time: 05/29/17 11:48 AM  Result Value Ref Range   Glucose-Capillary 115 (H) 65 - 99 mg/dL   Comment 1 Notify RN    Comment 2 Document in Chart   Ethanol     Status: Abnormal   Collection Time: 05/29/17 11:50 AM  Result Value Ref Range   Alcohol, Ethyl (B) 51 (H) <10 mg/dL    Comment:        LOWEST DETECTABLE LIMIT FOR SERUM ALCOHOL IS 10 mg/dL FOR MEDICAL PURPOSES ONLY   Protime-INR     Status: Abnormal   Collection Time: 05/29/17 11:50 AM  Result Value Ref Range   Prothrombin Time 17.7 (H) 11.4 - 15.2 seconds   INR 1.47   APTT     Status: None   Collection Time: 05/29/17 11:50 AM  Result Value Ref Range   aPTT 26 24 - 36 seconds  CBC     Status: Abnormal   Collection Time: 05/29/17 11:50 AM  Result Value Ref Range   WBC 4.5 4.0 - 10.5 K/uL   RBC 4.91 4.22 - 5.81 MIL/uL   Hemoglobin 11.4 (L) 13.0 - 17.0 g/dL   HCT 34.7 (L) 39.0 - 52.0 %   MCV 70.7 (L) 78.0 - 100.0 fL   MCH 23.2 (L) 26.0 - 34.0 pg   MCHC 32.9 30.0 - 36.0 g/dL   RDW 14.3 11.5 - 15.5 %   Platelets 166 150 - 400 K/uL  Differential     Status: None   Collection Time: 05/29/17 11:50 AM  Result Value Ref Range   Neutrophils Relative % 58 %   Lymphocytes Relative 34 %   Monocytes Relative 7 %   Eosinophils Relative 1 %   Basophils Relative 0 %   Neutro Abs 2.7 1.7 - 7.7 K/uL   Lymphs Abs 1.5 0.7 - 4.0 K/uL   Monocytes Absolute 0.3 0.1 - 1.0 K/uL   Eosinophils Absolute 0.0 0.0 - 0.7 K/uL   Basophils Absolute 0.0 0.0 - 0.1 K/uL   RBC Morphology TARGET CELLS     Comment: ELLIPTOCYTES  Comprehensive metabolic panel     Status: Abnormal   Collection Time: 05/29/17 11:50 AM  Result Value Ref Range   Sodium 142 135 - 145 mmol/L   Potassium  3.9 3.5 - 5.1 mmol/L   Chloride 108 101 - 111 mmol/L   CO2 17 (L) 22 - 32 mmol/L   Glucose, Bld 119 (H) 65 - 99 mg/dL  BUN 11 6 - 20 mg/dL   Creatinine, Ser 1.45 (H) 0.61 - 1.24 mg/dL   Calcium 8.7 (L) 8.9 - 10.3 mg/dL   Total Protein 6.5 6.5 - 8.1 g/dL   Albumin 3.1 (L) 3.5 - 5.0 g/dL   AST 28 15 - 41 U/L   ALT 18 17 - 63 U/L   Alkaline Phosphatase 49 38 - 126 U/L   Total Bilirubin 0.6 0.3 - 1.2 mg/dL   GFR calc non Af Amer 53 (L) >60 mL/min   GFR calc Af Amer >60 >60 mL/min    Comment: (NOTE) The eGFR has been calculated using the CKD EPI equation. This calculation has not been validated in all clinical situations. eGFR's persistently <60 mL/min signify possible Chronic Kidney Disease.    Anion gap 17 (H) 5 - 15  I-stat troponin, ED     Status: None   Collection Time: 05/29/17 11:57 AM  Result Value Ref Range   Troponin i, poc 0.01 0.00 - 0.08 ng/mL   Comment 3            Comment: Due to the release kinetics of cTnI, a negative result within the first hours of the onset of symptoms does not rule out myocardial infarction with certainty. If myocardial infarction is still suspected, repeat the test at appropriate intervals.   I-Stat Chem 8, ED     Status: Abnormal   Collection Time: 05/29/17 11:58 AM  Result Value Ref Range   Sodium 143 135 - 145 mmol/L   Potassium 3.8 3.5 - 5.1 mmol/L   Chloride 109 101 - 111 mmol/L   BUN 11 6 - 20 mg/dL   Creatinine, Ser 1.50 (H) 0.61 - 1.24 mg/dL   Glucose, Bld 112 (H) 65 - 99 mg/dL   Calcium, Ion 1.04 (L) 1.15 - 1.40 mmol/L   TCO2 17 (L) 22 - 32 mmol/L   Hemoglobin 11.9 (L) 13.0 - 17.0 g/dL   HCT 35.0 (L) 39.0 - 52.0 %    Ct Angio Head W Or Wo Contrast  Result Date: 05/29/2017 CLINICAL DATA:  56 year old male code stroke. Left side weakness and slurred speech. EXAM: CT ANGIOGRAPHY HEAD AND NECK TECHNIQUE: Multidetector CT imaging of the head and neck was performed using the standard protocol during bolus administration of  intravenous contrast. Multiplanar CT image reconstructions and MIPs were obtained to evaluate the vascular anatomy. Carotid stenosis measurements (when applicable) are obtained utilizing NASCET criteria, using the distal internal carotid diameter as the denominator. CONTRAST:  162m ISOVUE-370 IOPAMIDOL (ISOVUE-370) INJECTION 76% COMPARISON:  Head CT without contrast 1202 hours today. FINDINGS: CTA NECK Skeleton: No acute osseous abnormality identified. Visualized paranasal sinuses and mastoids are stable and well pneumatized. Upper chest: Negative visualized upper lungs. Negative visualized superior mediastinum aside from mild lipomatosis. Partially visible subpectoral implants. Other neck: Negative.  No cervical lymphadenopathy. Aortic arch: No aortic arch atherosclerosis or great vessel origin stenosis. Incidental distal location of the left subclavian origin from the arch (series 10, image 136). Right carotid system: Negative brachiocephalic artery and right CCA origin. Negative right CCA. Minimal soft atherosclerotic plaque at the anterior right carotid bifurcation near the ECA origin. Widely patent right ICA in the neck to the skullbase. Left carotid system: Negative left CCA aside from mild tortuosity. Negative left carotid bifurcation and cervical left ICA. Vertebral arteries: Normal proximal right subclavian artery. Normal right vertebral artery origin. Mild obscuration of some of the right V1 segment related to paravertebral venous contrast. The right vertebral artery  appears normal to the skullbase. Normal left CCA origin aside from somewhat distal positioning from the arch. Subsequently there is mild tortuosity of the proximal left subclavian. Normal left vertebral artery origin. Mildly non dominant left vertebral artery is normal to the skullbase. CTA HEAD Posterior circulation: Normal distal vertebral arteries. Patent PICA origins. Mildly fenestrated vertebrobasilar junction (normal variant) without  stenosis. Patent basilar artery without stenosis. Normal SCA and PCA origins. Posterior communicating arteries are diminutive or absent. Bilateral PCA branches are within normal limits. Anterior circulation: Patent ICA siphons. Minimal calcified plaque on the left. No siphon stenosis. Normal ophthalmic artery origins. Patent carotid termini. Normal MCA and ACA origins. Mildly tortuous A1 segments. Diminutive or absent anterior communicating artery. Bilateral ACA branches are within normal limits. Left MCA M1 segment, left MCA bifurcation, and visible left MCA branches are within normal limits. Slightly early arterial enhancement timing at the MCA branches. Right MCA M1 segment, right MCA bifurcation, and visible right MCA branches are within normal limits. Venous sinuses: Early contrast timing, but there is early enhancement of the major dural venous sinuses suggesting patency. Anatomic variants: Mildly dominant right vertebral artery. Fenestrated vertebrobasilar junction. Somewhat distal origin of the left subclavian artery from the aortic arch. Review of the MIP images confirms the above findings IMPRESSION: 1. Negative for emergent large vessel occlusion. 2. Somewhat early contrast timing for the bilateral distal MCA branches. No MCA branch occlusion is identified. 3. Only minimal atherosclerosis identified in the head and neck. No arterial stenosis identified. A preliminary report of the above was communicated to Dr. Leonie Man at 1234 hours on 12/26/2018by text page via the [facility] messaging system. Electronically Signed   By: Genevie Ann M.D.   On: 05/29/2017 12:42   Ct Angio Neck W Or Wo Contrast  Result Date: 05/29/2017 CLINICAL DATA:  56 year old male code stroke. Left side weakness and slurred speech. EXAM: CT ANGIOGRAPHY HEAD AND NECK TECHNIQUE: Multidetector CT imaging of the head and neck was performed using the standard protocol during bolus administration of intravenous contrast. Multiplanar CT image  reconstructions and MIPs were obtained to evaluate the vascular anatomy. Carotid stenosis measurements (when applicable) are obtained utilizing NASCET criteria, using the distal internal carotid diameter as the denominator. CONTRAST:  150m ISOVUE-370 IOPAMIDOL (ISOVUE-370) INJECTION 76% COMPARISON:  Head CT without contrast 1202 hours today. FINDINGS: CTA NECK Skeleton: No acute osseous abnormality identified. Visualized paranasal sinuses and mastoids are stable and well pneumatized. Upper chest: Negative visualized upper lungs. Negative visualized superior mediastinum aside from mild lipomatosis. Partially visible subpectoral implants. Other neck: Negative.  No cervical lymphadenopathy. Aortic arch: No aortic arch atherosclerosis or great vessel origin stenosis. Incidental distal location of the left subclavian origin from the arch (series 10, image 136). Right carotid system: Negative brachiocephalic artery and right CCA origin. Negative right CCA. Minimal soft atherosclerotic plaque at the anterior right carotid bifurcation near the ECA origin. Widely patent right ICA in the neck to the skullbase. Left carotid system: Negative left CCA aside from mild tortuosity. Negative left carotid bifurcation and cervical left ICA. Vertebral arteries: Normal proximal right subclavian artery. Normal right vertebral artery origin. Mild obscuration of some of the right V1 segment related to paravertebral venous contrast. The right vertebral artery appears normal to the skullbase. Normal left CCA origin aside from somewhat distal positioning from the arch. Subsequently there is mild tortuosity of the proximal left subclavian. Normal left vertebral artery origin. Mildly non dominant left vertebral artery is normal to the skullbase. CTA HEAD Posterior  circulation: Normal distal vertebral arteries. Patent PICA origins. Mildly fenestrated vertebrobasilar junction (normal variant) without stenosis. Patent basilar artery without  stenosis. Normal SCA and PCA origins. Posterior communicating arteries are diminutive or absent. Bilateral PCA branches are within normal limits. Anterior circulation: Patent ICA siphons. Minimal calcified plaque on the left. No siphon stenosis. Normal ophthalmic artery origins. Patent carotid termini. Normal MCA and ACA origins. Mildly tortuous A1 segments. Diminutive or absent anterior communicating artery. Bilateral ACA branches are within normal limits. Left MCA M1 segment, left MCA bifurcation, and visible left MCA branches are within normal limits. Slightly early arterial enhancement timing at the MCA branches. Right MCA M1 segment, right MCA bifurcation, and visible right MCA branches are within normal limits. Venous sinuses: Early contrast timing, but there is early enhancement of the major dural venous sinuses suggesting patency. Anatomic variants: Mildly dominant right vertebral artery. Fenestrated vertebrobasilar junction. Somewhat distal origin of the left subclavian artery from the aortic arch. Review of the MIP images confirms the above findings IMPRESSION: 1. Negative for emergent large vessel occlusion. 2. Somewhat early contrast timing for the bilateral distal MCA branches. No MCA branch occlusion is identified. 3. Only minimal atherosclerosis identified in the head and neck. No arterial stenosis identified. A preliminary report of the above was communicated to Dr. Leonie Man at 1234 hours on 12/26/2018by text page via the [facility] messaging system. Electronically Signed   By: Genevie Ann M.D.   On: 05/29/2017 12:42   Ct Head Code Stroke Wo Contrast  Result Date: 05/29/2017 CLINICAL DATA:  Code stroke. Left-sided weakness and slurred speech. EXAM: CT HEAD WITHOUT CONTRAST TECHNIQUE: Contiguous axial images were obtained from the base of the skull through the vertex without intravenous contrast. COMPARISON:  None. FINDINGS: Brain: There is no evidence of acute infarct, intracranial hemorrhage, mass, midline  shift, or extra-axial fluid collection. The ventricles and sulci are normal. Vascular: Mild calcified atherosclerosis in the carotid siphons. No hyperdense vessel. Skull: No fracture or focal osseous lesion. Sinuses/Orbits: The visualized paranasal sinuses and mastoid air cells are clear. Cerumen is noted in the external auditory canals. The orbits are unremarkable. Other: None. ASPECTS [facility] Stroke Program Early CT Score) - Ganglionic level infarction (caudate, lentiform nuclei, internal capsule, insula, M1-M3 cortex): 7 - Supraganglionic infarction (M4-M6 cortex): 3 Total score (0-10 with 10 being normal): 10 IMPRESSION: 1. No evidence of acute intracranial abnormality. Unremarkable CT appearance of the brain. 2. ASPECTS is 10. These results were communicated to Dr. Leonie Man at 12:16 [place] 05/29/2017 by text page via the [facility] messaging system. Electronically Signed   By: Logan Bores M.D.   On: 05/29/2017 12:16       ROS Blood pressure (!) 119/97, pulse 92, resp. rate 18, height 6' (1.829 m), weight 253 lb 8.5 oz (115 kg), SpO2 92 %. Physical Exam Obese middle-aged African-American male currently not in distress. . Afebrile. Head is nontraumatic. Neck is supple without bruit.    Cardiac exam no murmur or gallop. Lungs are clear to auscultation. Distal pulses are well felt. Neurological exam Awake alert oriented 3. Diminished attention registration and recall. Follows commands well. No dysarthria but voices hesitant and slightly nonfluent. Able to name and repeat and comprehend quite well. Extraocular moments are full range without nystagmus. Fundi were not visualized. Vision acuity seems adequate. Partial left visual field defect to bedside confrontational testing ( chronic as per wife). No facial weakness. Tongue midline. Motor system exam no upper extremity drift. Mild weakness in that prescription but doubt patient's  effort. Mild left lower extremity proximal weakness but complains of pain in that  limb. Reflexes are 1+ symmetric plantars downgoing. Sensation intact. Gait not tested. Modified [place] at baseline : 2 NIH stroke scale 1  Assessment/Plan: 56 year old African-American male presents with a fall at home possibly a micturition syncope given the circumstances followed by transient slurred speech and left sided weakness which appears to be improving. Patient has history of remote traumatic brain injury and mild residual deficits from that. Brain imaging shows no acute abnormality and CT angiogram shows no significant large vessel stenosis or occlusion. I doubt that this represents a new stroke. It could represent transient worsening of old deficits in the setting of an acute stress. Recommend admission to the medical team for observation. IV hydration and optimize blood pressure. Continue eliquis for anticoagulation for atrial fibrillation and DVT. I do not believe further stroke related testing is necessary. Physical occupational therapy and mobilize out of bed as tolerated. Stroke team will follow and call for questions. Discussed with patient, wife and ER M.D. Greater than 50% time during this 80 minute consultation was spent on counseling and coordination of care about his episode of syncope, TIA and answering questions  Antony Contras 05/29/2017, 2:15 PM    Note: This document was prepared with digital dictation and possible smart phrase technology. Any transcriptional errors that result from this process are unintentional.

## 2017-05-29 NOTE — H&P (Signed)
History and Physical  Garrett Ferrell ZOX:[id number] DOB: April 10, 1961 DOA: 05/29/2017  Referring physician:Dr. Ethelda Chick  PCP: Jackie Plum, MD  Outpatient Specialists: None Patient coming from:Home At his baseline ambulates independently  Chief Complaint: slurred speech  HPI: Garrett Ferrell is a 56 y.o. male with medical history significant for chronic atrial fibrillation on Eliquis, hypertension, traumatic brain injury, schizophrenia/bipolar disorder, depression, anxiety who presents on 05/29/2017 with unwitnessed fall.  Wife is present at bedside to corroborate history.  Patient was in his usual state of health and the day prior to admission he was drinking vodka heavily.  Patient states he frequently drinks a bottle every 1-2 days.  While brushing his teeth in the bathroom he turned to walk away and suddenly blacked out.  He cannot remember much more by his history after that.  His wife did not witness his fall states she was concerned because his speech sounded slurred when she found him laying on his side on the bathroom floor.  She was worried he may have hit his head after the fall she called EMS.  EMS also noticed a slurred speech and found him to have low blood pressure and symptoms concerning for stroke   ED Course: Patient was afebrile normal oxygen saturation, heart rate 102 93, blood pressure 114/79.  Lab workup significant for creatinine of 1.5, glucose 112, hemoglobin 11.4.  Code stroke was called on arrival and neurology was consulted.However CT head and CT angios of head and neck were negative for any acute stroke.  Patient's condition.  More consistent with acute alcohol intoxication and likely orthostatic syncope.  He was given 1 L normal saline bolus and patient was admitted for further observation  Review of Systems:As mentioned in the history of present illness.Review of systems are otherwise negative Patient seen in the ED .    Past Medical History:  Diagnosis Date  .  Anemia    takes Iron pill daily  . Anxiety    takes Xanax daily as needed  . Arthritis   . Atrial fibrillation (HCC)    takes Eliquis daily. Last dose will be 10/03/16  . Depression    takes Risperdal daily  . DVT (deep venous thrombosis) (HCC)    both legs in 2016  . Dyspnea    when laying on back  . Gout    takes Allopurinol daily  . Headache    occasionally  . History of shingles 2010  . Hyperlipidemia    takes Atorvastatin daily  . Hyperlipidemia    takes Atorvastatin daily  . Hypertension    takes Metoprolol daily  . Insomnia    takes Ambien and Trazodone nightly  . Joint pain   . Joint swelling    right ankle  . 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 Nocturia   . Seizures (HCC)    last one in 2002   Past Surgical History:  Procedure Laterality Date  . ANKLE ARTHROSCOPY Right 04/20/2016   Procedure: ANKLE ARTHROSCOPY;  Surgeon: Nadara Mustard, MD;  Location: [facility] OR;  Service: Orthopedics;  Laterality: Right;  . ANKLE ARTHROSCOPY WITH FUSION Right 10/05/2016   Procedure: RIGHT ANKLE ARTHROSCOPIC FUSION;  Surgeon: Nadara Mustard, MD;  Location: [facility] OR;  Service: Orthopedics;  Laterality: Right;  . ANKLE FUSION Right 04/20/2016   Procedure: Right Posterior Arthroscopic Subtalar Arthrodesis;  Surgeon: Nadara Mustard, MD;  Location: [facility] OR;  Service: Orthopedics;  Laterality: Right;  . ANKLE SURGERY Right  plates/screws  . COLONOSCOPY WITH PROPOFOL N/A 11/27/2016   Procedure: COLONOSCOPY WITH PROPOFOL;  Surgeon: Charolett BumpersJohnson, Martin K, MD;  Location: [facility];  Service: Endoscopy;  Laterality: N/A;  . HERNIA REPAIR     1986-umbilical  . RIGHT [facility]ART [facility]  [facility] his [facility]    Social History:  reports that  has never smoked. he has never used smokeless tobacco. He reports that he drinks alcohol. He reports that he does not use drugs.   No Known Allergies  Family History  Problem Relation Age of Onset  . Heart Problems  Mother   . Heart attack Father        age 3740s      Prior to Admission medications   Medication Sig Start Date End Date Taking? Authorizing Provider  allopurinol (ZYLOPRIM) 100 MG tablet Take 100 mg by mouth daily. 12/22/15   [provider]  ALPRAZolam Prudy Feeler(XANAX) 0.5 MG tablet Take 0.5 mg by mouth every 8 (eight) hours as needed for anxiety.  10/21/15   [provider]  atorvastatin (LIPITOR) 10 MG tablet Take 10 mg by mouth every evening.     [provider]  diltiazem (CARDIZEM CD) 240 MG 24 hr capsule Take 1 capsule (240 mg total) by mouth daily. 08/09/16   Joseph ArtVann, Jessica U, DO  ELIQUIS 5 MG TABS tablet Take 1 tablet (5 mg total) by mouth 2 (two) times daily. 09/05/16   Azalee CourseMeng, Hao, PA  ferrous sulfate 325 (65 FE) MG EC tablet Take 325 mg by mouth daily with breakfast.    [provider]  losartan (COZAAR) 25 MG tablet Take 25 mg by mouth daily. 09/24/16   [provider]  meloxicam (MOBIC) 15 MG tablet Take 1 tablet (15 mg total) by mouth daily. 01/09/16   Myra RudeSchmitz, Jeremy E, MD  oxyCODONE-acetaminophen (ROXICET) 5-325 MG tablet Take 1 tablet by mouth every 8 (eight) hours as needed for severe pain. 02/07/17   Nadara Mustarduda, Marcus V, MD  risperiDONE (RISPERDAL) 1 MG tablet Take 2 mg by mouth 2 (two) times daily.  12/23/15   [provider]  SEROQUEL XR 150 MG 24 hr tablet Take 150 mg by mouth at bedtime. 12/23/15   [provider]  tiZANidine (ZANAFLEX) 2 MG tablet Take 2-4 mg by mouth every 8 (eight) hours as needed for muscle spasms.  06/02/16   [provider]  trazodone (DESYREL) 300 MG tablet Take 300 mg by mouth at bedtime. 10/24/15   [provider]  VIAGRA 100 MG tablet Take 100 mg by mouth daily as needed for erectile dysfunction.  09/30/15   [provider]  zolpidem (AMBIEN) 10 MG tablet Take 10 mg by mouth at bedtime.     [provider]    Physical Exam: BP (!) 129/98 (BP Location: Left Arm)   Pulse 90    Temp 97.6 F (36.4 C) (Oral)   Resp 16   Ht 6' (1.829 m)   Wt 115 kg (253 lb 8.5 oz)   SpO2 100%   BMI 34.38 kg/m    General: Lying in bed in Appear in no distress Eyes: EOMI, anicteric, reddened sclera ENT: Oral Mucosa clear and dry. Neck: normal, supple, no masses, no JVD Cardiovascular: irregularly irregular rhythm, normal rate, no murmurs, rubs or gallops. Peripheral Pulses present.  No edema Respiratory: Normal respiratory effort, clear breath sounds on auscultation bilaterally, no rales wheezes or rhonchi Abdomen: soft, non-distended, non-tender, normal bowel sounds,  no guarding or rebound tenderness Skin:  No Rash Musculoskeletal:,  No clubbing / cyanosis. No joint deformity upper and lower extremities. Good ROM, no contractures. Normal muscle tone Neurologic: Grossly no focal neuro deficit.Mental status alert and oriented to person and place.  Unable to give correct time (month or year).  Slurred speech without facial drooping or other cranial nerve deficits.  Able to follow simple commands Psychiatric: Flat affect          Labs on Admission:  Basic Metabolic Panel: Recent Labs  Lab 05/29/17 1150 05/29/17 1158  NA 142 143  K 3.9 3.8  CL 108 109  CO2 17*  --   GLUCOSE 119* 112*  BUN 11 11  CREATININE 1.45* 1.50*  CALCIUM 8.7*  --    Liver Function Tests: Recent Labs  Lab 05/29/17 1150  AST 28  ALT 18  ALKPHOS 49  BILITOT 0.6  PROT 6.5  ALBUMIN 3.1*   No results for input(s): LIPASE, AMYLASE in the last 168 hours. No results for input(s): AMMONIA in the last 168 hours. CBC: Recent Labs  Lab 05/29/17 1150 05/29/17 1158  WBC 4.5  --   NEUTROABS 2.7  --   HGB 11.4* 11.9*  HCT 34.7* 35.0*  MCV 70.7*  --   PLT 166  --    Cardiac Enzymes: No results for input(s): CKTOTAL, CKMB, CKMBINDEX, TROPONINI in the last 168 hours.  BNP (last 3 results) No results for input(s): BNP in the last 8760 hours.  ProBNP (last 3 results) No results for input(s):  PROBNP in the last 8760 hours.  CBG: Recent Labs  Lab 05/29/17 1148  GLUCAP 115*    Radiological Exams on Admission: Ct Angio Head W Or Wo Contrast  Result Date: 05/29/2017 CLINICAL DATA:  56 year old male code stroke. Left side weakness and slurred speech. EXAM: CT ANGIOGRAPHY HEAD AND NECK TECHNIQUE: Multidetector CT imaging of the head and neck was performed using the standard protocol during bolus administration of intravenous contrast. Multiplanar CT image reconstructions and MIPs were obtained to evaluate the vascular anatomy. Carotid stenosis measurements (when applicable) are obtained utilizing NASCET criteria, using the distal internal carotid diameter as the denominator. CONTRAST:  100mL ISOVUE-370 IOPAMIDOL (ISOVUE-370) INJECTION 76% COMPARISON:  Head CT without contrast 1202 hours today. FINDINGS: CTA NECK Skeleton: No acute osseous abnormality identified. Visualized paranasal sinuses and mastoids are stable and well pneumatized. Upper chest: Negative visualized upper lungs. Negative visualized superior mediastinum aside from mild lipomatosis. Partially visible subpectoral implants. Other neck: Negative.  No cervical lymphadenopathy. Aortic arch: No aortic arch atherosclerosis or great vessel origin stenosis. Incidental distal location of the left subclavian origin from the arch (series 10, image 136). Right carotid system: Negative brachiocephalic artery and right CCA origin. Negative right CCA. Minimal soft atherosclerotic plaque at the anterior right carotid bifurcation near the ECA origin. Widely patent right ICA in the neck to the skullbase. Left carotid system: Negative left CCA aside from mild tortuosity. Negative left carotid bifurcation and cervical left ICA. Vertebral arteries: Normal proximal right subclavian artery. Normal right vertebral artery origin. Mild obscuration of some of the right V1 segment related to paravertebral venous contrast. The right vertebral artery appears  normal to the skullbase. Normal left CCA origin aside from somewhat distal positioning from the arch. Subsequently there is mild tortuosity of the proximal left subclavian. Normal left vertebral artery origin. Mildly non dominant left vertebral artery is normal to the skullbase. CTA HEAD Posterior circulation: Normal distal vertebral arteries. Patent PICA  origins. Mildly fenestrated vertebrobasilar junction (normal variant) without stenosis. Patent basilar artery without stenosis. Normal SCA and PCA origins. Posterior communicating arteries are diminutive or absent. Bilateral PCA branches are within normal limits. Anterior circulation: Patent ICA siphons. Minimal calcified plaque on the left. No siphon stenosis. Normal ophthalmic artery origins. Patent carotid termini. Normal MCA and ACA origins. Mildly tortuous A1 segments. Diminutive or absent anterior communicating artery. Bilateral ACA branches are within normal limits. Left MCA M1 segment, left MCA bifurcation, and visible left MCA branches are within normal limits. Slightly early arterial enhancement timing at the MCA branches. Right MCA M1 segment, right MCA bifurcation, and visible right MCA branches are within normal limits. Venous sinuses: Early contrast timing, but there is early enhancement of the major dural venous sinuses suggesting patency. Anatomic variants: Mildly dominant right vertebral artery. Fenestrated vertebrobasilar junction. Somewhat distal origin of the left subclavian artery from the aortic arch. Review of the MIP images confirms the above findings IMPRESSION: 1. Negative for emergent large vessel occlusion. 2. Somewhat early contrast timing for the bilateral distal MCA branches. No MCA branch occlusion is identified. 3. Only minimal atherosclerosis identified in the head and neck. No arterial stenosis identified. A preliminary report of the above was communicated to Dr. Pearlean Brownie at 1234 hours on 12/26/2018by text page via the [facility]  messaging system. Electronically Signed   By: Odessa Fleming M.D.   On: 05/29/2017 12:42   Ct Angio Neck W Or Wo Contrast  Result Date: 05/29/2017 CLINICAL DATA:  56 year old male code stroke. Left side weakness and slurred speech. EXAM: CT ANGIOGRAPHY HEAD AND NECK TECHNIQUE: Multidetector CT imaging of the head and neck was performed using the standard protocol during bolus administration of intravenous contrast. Multiplanar CT image reconstructions and MIPs were obtained to evaluate the vascular anatomy. Carotid stenosis measurements (when applicable) are obtained utilizing NASCET criteria, using the distal internal carotid diameter as the denominator. CONTRAST:  ISOVUE-370 IOPAMIDOL (ISOVUE-370) INJECTION 76% COMPARISON:  Head CT without contrast 1202 hours today. FINDINGS: CTA NECK Skeleton: No acute osseous abnormality identified. Visualized paranasal sinuses and mastoids are stable and well pneumatized. Upper chest: Negative visualized upper lungs. Negative visualized superior mediastinum aside from mild lipomatosis. Partially visible subpectoral implants. Other neck: Negative.  No cervical lymphadenopathy. Aortic arch: No aortic arch atherosclerosis or great vessel origin stenosis. Incidental distal location of the left subclavian origin from the arch (series 10, image 136). Right carotid system: Negative brachiocephalic artery and right CCA origin. Negative right CCA. Minimal soft atherosclerotic plaque at the anterior right carotid bifurcation near the ECA origin. Widely patent right ICA in the neck to the skullbase. Left carotid system: Negative left CCA aside from mild tortuosity. Negative left carotid bifurcation and cervical left ICA. Vertebral arteries: Normal proximal right subclavian artery. Normal right vertebral artery origin. Mild obscuration of some of the right V1 segment related to paravertebral venous contrast. The right vertebral artery appears normal to the skullbase. Normal left CCA  origin aside from somewhat distal positioning from the arch. Subsequently there is mild tortuosity of the proximal left subclavian. Normal left vertebral artery origin. Mildly non dominant left vertebral artery is normal to the skullbase. CTA HEAD Posterior circulation: Normal distal vertebral arteries. Patent PICA origins. Mildly fenestrated vertebrobasilar junction (normal variant) without stenosis. Patent basilar artery without stenosis. Normal SCA and PCA origins. Posterior communicating arteries are diminutive or absent. Bilateral PCA branches are within normal limits. Anterior circulation: Patent ICA siphons. Minimal calcified plaque on the left. No siphon  stenosis. Normal ophthalmic artery origins. Patent carotid termini. Normal MCA and ACA origins. Mildly tortuous A1 segments. Diminutive or absent anterior communicating artery. Bilateral ACA branches are within normal limits. Left MCA M1 segment, left MCA bifurcation, and visible left MCA branches are within normal limits. Slightly early arterial enhancement timing at the MCA branches. Right MCA M1 segment, right MCA bifurcation, and visible right MCA branches are within normal limits. Venous sinuses: Early contrast timing, but there is early enhancement of the major dural venous sinuses suggesting patency. Anatomic variants: Mildly dominant right vertebral artery. Fenestrated vertebrobasilar junction. Somewhat distal origin of the left subclavian artery from the aortic arch. Review of the MIP images confirms the above findings IMPRESSION: 1. Negative for emergent large vessel occlusion. 2. Somewhat early contrast timing for the bilateral distal MCA branches. No MCA branch occlusion is identified. 3. Only minimal atherosclerosis identified in the head and neck. No arterial stenosis identified. A preliminary report of the above was communicated to Dr. Pearlean Brownie at 1234 hours on 12/26/2018by text page via the [facility] messaging system. Electronically Signed   By: Odessa Fleming M.D.   On: 05/29/2017 12:42   Ct Head Code Stroke Wo Contrast  Result Date: 05/29/2017 CLINICAL DATA:  Code stroke. Left-sided weakness and slurred speech. EXAM: CT HEAD WITHOUT CONTRAST TECHNIQUE: Contiguous axial images were obtained from the base of the skull through the vertex without intravenous contrast. COMPARISON:  None. FINDINGS: Brain: There is no evidence of acute infarct, intracranial hemorrhage, mass, midline shift, or extra-axial fluid collection. The ventricles and sulci are normal. Vascular: Mild calcified atherosclerosis in the carotid siphons. No hyperdense vessel. Skull: No fracture or focal osseous lesion. Sinuses/Orbits: The visualized paranasal sinuses and mastoid air cells are clear. Cerumen is noted in the external auditory canals. The orbits are unremarkable. Other: None. ASPECTS [facility] Stroke Program Early CT Score) - Ganglionic level infarction (caudate, lentiform nuclei, internal capsule, insula, M1-M3 cortex): 7 - Supraganglionic infarction (M4-M6 cortex): 3 Total score (0-10 with 10 being normal): 10 IMPRESSION: 1. No evidence of acute intracranial abnormality. Unremarkable CT appearance of the brain. 2. ASPECTS is 10. These results were communicated to Dr. Pearlean Brownie at 12:16 pmon 05/29/2017 by text page via the [facility] messaging system. Electronically Signed   By: Sebastian Ache M.D.   On: 05/29/2017 12:16    EKG: Independently reviewed.  Atrial fibrillation with right bundle branch block, unchanged from previous tracings.  Heart rate 99  Assessment/Plan Present on Admission: . Syncope . Hyperlipidemia  Principal Problem:   Syncope.  Unwitnessed fall in the setting of acute alcohol intoxication, likely complicated by orthostatics given patient on blood pressure medications and not maintaining hydration.  Slurred speech more consistent with intoxication and acute neurologic process.  Possible syncope could be related to atrial fibrillation the patient on RVR though acute  alcohol intoxication could cause such -Monitor on telemetry -Orthostatics on admission, repeat in a.m. after IV fluids time for 12 hours   Active Problems:   Atrial fibrillation, chronic (HCC).  Currently rate controlled.  Continue diltiazem and Eliquis    Hyperlipidemia, stable.  Continue atorvastatin 10 mg    Bipolar disorder and  Schizophrenia (HCC).  Pruritus of the setting of known traumatic brain injury though unclear timing.  She states he does have problems with executive functioning (needs helps with IADLs and ADLs).  Continue home Seroquel, Risperdal, trazodone   Slurred speech consistent with alcohol abuse with intoxication.  Also drinks 1 bottle of vodka every 1-2 days. Ethanol  level 50. Wife states he has never been in alcohol withdrawal.  Patient currently hemodynamically stable.  Last drink last night.  We will continue to monitor on CIWA protocol.  Significant counseling on alcohol cessation given on admission.  Neurology recommends no further workup for stroke. PT in am   DVT prophylaxis: On Eliquis at home Code Status: Full code  Family Communication: Wife was present at bedside and updated appropriately at the time of interview.   Disposition Plan: Monitor overnight, measure orthostatics, monitor telemetry  Consults called: Neurology  Admission status: Admitted as observation telemetry unit.      Laverna Peace MD [facility]  Pager [phone]  If 7PM-7AM, please contact night-coverage [email or web address] Password [email or web address]  05/29/2017, 6:01 PM

## 2017-05-29 NOTE — Code Documentation (Signed)
56 [facility] as code stroke called in field.  Per EMS has hx of TBI in year 2000.  Per report he got up without deficits around 1045 this AM - went to bathroom - had difficulty voiding - started to brush his teeth and his legs began to feel weak - he collapsed.  Wife found him - called EMS.  In field he had slurred speech, left side weakness.  He was hypotensive in field - NS was started.  On arrival to ED he was alert - slow to respond but followed commands - answered questions appropriately - moving all extremities - left with slight drift at first - then showed bilateral generalized weakness.  Airway cleared at bridge - to CT scan - Dr. Pearlean BrownieSethi present.  BP 106/75 HR 111 RR 24 O2 sats 94% on RA.  Patient reports he is on Eliquis for afib.  NIHSS 2 - field cut from previous TBI - slurred speech.  Negative CTA - no acute treatment per Dr. Pearlean BrownieSethi.  Handoff to Solectron CorporationJamie RN.

## 2017-05-29 NOTE — Progress Notes (Signed)
Garrett Ferrell [id number]  Code Status: FULL Admission Data: 05/29/2017 1740  Attending Provider: Caleb PoppNettey  WUJ:[id number]:Osei-Bonsu, Greggory StallionGeorge, MD   Garrett Ferrell is a 56 y.o. male patient admitted from ED awake, alert - oriented X 3 - no acute distress noted. VSS - Blood pressure (!) 129/98, pulse 90, temperature 97.6 F (36.4 C), temperature source Oral, resp. rate 16, height 6' (1.829 m), weight 115 kg (253 lb 8.5 oz), SpO2 100 %. no c/o shortness of breath, no c/o chest pain. Cardiac tele # 17, in place, cardiac monitor yields: A-fib. Allergies: No Known Allergies    Past Medical History:  Diagnosis Date  . Anemia    takes Iron pill daily  . Anxiety    takes Xanax daily as needed  . Arthritis   . Atrial fibrillation (HCC)    takes Eliquis daily. Last dose will be 10/03/16  . Depression    takes Risperdal daily  . DVT (deep venous thrombosis) (HCC)    both legs in 2016  . Dyspnea    when laying on back  . Gout    takes Allopurinol daily  . Headache    occasionally  . History of shingles 2010  . Hyperlipidemia    takes Atorvastatin daily  . Hyperlipidemia    takes Atorvastatin daily  . Hypertension    takes Metoprolol daily  . Insomnia    takes Ambien and Trazodone nightly  . Joint pain   . Joint swelling    right ankle  . 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 Nocturia   . Seizures (HCC)    last one in 2002    Medications Prior to Admission  Medication Sig Dispense Refill  . allopurinol (ZYLOPRIM) 100 MG tablet Take 100 mg by mouth daily.  5  . ALPRAZolam (XANAX) 0.5 MG tablet Take 0.5 mg by mouth every 8 (eight) hours as needed for anxiety.   0  . atorvastatin (LIPITOR) 10 MG tablet Take 10 mg by mouth every evening.     . diltiazem (CARDIZEM CD) 240 MG 24 hr capsule Take 1 capsule (240 mg total) by mouth daily. 30 capsule 0  . ELIQUIS 5 MG TABS tablet Take 1 tablet (5 mg total) by mouth 2 (two) times daily. 60 tablet 11  . ferrous sulfate 325 (65 FE) MG EC tablet Take 325 mg  by mouth daily with breakfast.    . losartan (COZAAR) 25 MG tablet Take 25 mg by mouth daily.  3  . meloxicam (MOBIC) 15 MG tablet Take 1 tablet (15 mg total) by mouth daily. 30 tablet 1  . oxyCODONE-acetaminophen (ROXICET) 5-325 MG tablet Take 1 tablet by mouth every 8 (eight) hours as needed for severe pain. 30 tablet 0  . risperiDONE (RISPERDAL) 1 MG tablet Take 2 mg by mouth 2 (two) times daily.   5  . SEROQUEL XR 150 MG 24 hr tablet Take 150 mg by mouth at bedtime.  1  . tiZANidine (ZANAFLEX) 2 MG tablet Take 2-4 mg by mouth every 8 (eight) hours as needed for muscle spasms.   1  . trazodone (DESYREL) 300 MG tablet Take 300 mg by mouth at bedtime.  5  . zolpidem (AMBIEN) 10 MG tablet Take 10 mg by mouth at bedtime.     [name]. VIAGRA 100 MG tablet Take 100 mg by mouth daily as needed for erectile dysfunction.   2    Orientation to room, and floor completed with information packet given to  patient/family. Patient declined safety video at this time. Admission INP armband ID verified with patient/family, and in place.  SR up x 2, fall assessment complete, with patient and family able to verbalize understanding of risk associated with falls, and verbalized understanding to call nsg before up out of bed. Call light within reach, patient able to voice, and demonstrate understanding. Skin, clean-dry- intact without evidence of bruising, or skin tears.  No evidence of skin break down noted on exam.  ?  Will cont to eval and treat per MD orders.  Jon GillsElisa R Cayde Held, RN  05/29/2017 7:26 PM

## 2017-05-29 NOTE — Progress Notes (Signed)
Pt has orders for both q2 and q4. Neuro and vitals check. Provider on call notified to clarify.

## 2017-05-30 ENCOUNTER — Observation Stay (HOSPITAL_COMMUNITY): Payer: Medicaid Other

## 2017-05-30 DIAGNOSIS — Y92012 Bathroom of single-family (private) house as the place of occurrence of the external cause: Secondary | ICD-10-CM | POA: Diagnosis not present

## 2017-05-30 DIAGNOSIS — W1830XA Fall on same level, unspecified, initial encounter: Secondary | ICD-10-CM | POA: Diagnosis present

## 2017-05-30 DIAGNOSIS — M1990 Unspecified osteoarthritis, unspecified site: Secondary | ICD-10-CM | POA: Diagnosis present

## 2017-05-30 DIAGNOSIS — Y902 Blood alcohol level of 40-59 mg/100 ml: Secondary | ICD-10-CM | POA: Diagnosis present

## 2017-05-30 DIAGNOSIS — I10 Essential (primary) hypertension: Secondary | ICD-10-CM | POA: Diagnosis present

## 2017-05-30 DIAGNOSIS — F209 Schizophrenia, unspecified: Secondary | ICD-10-CM | POA: Diagnosis present

## 2017-05-30 DIAGNOSIS — D649 Anemia, unspecified: Secondary | ICD-10-CM | POA: Diagnosis present

## 2017-05-30 DIAGNOSIS — M109 Gout, unspecified: Secondary | ICD-10-CM | POA: Diagnosis present

## 2017-05-30 DIAGNOSIS — R39198 Other difficulties with micturition: Secondary | ICD-10-CM | POA: Diagnosis present

## 2017-05-30 DIAGNOSIS — F10129 Alcohol abuse with intoxication, unspecified: Secondary | ICD-10-CM | POA: Diagnosis present

## 2017-05-30 DIAGNOSIS — Z79899 Other long term (current) drug therapy: Secondary | ICD-10-CM | POA: Diagnosis not present

## 2017-05-30 DIAGNOSIS — H5462 Unqualified visual loss, left eye, normal vision right eye: Secondary | ICD-10-CM | POA: Diagnosis present

## 2017-05-30 DIAGNOSIS — F319 Bipolar disorder, unspecified: Secondary | ICD-10-CM | POA: Diagnosis present

## 2017-05-30 DIAGNOSIS — R296 Repeated falls: Secondary | ICD-10-CM | POA: Diagnosis not present

## 2017-05-30 DIAGNOSIS — Z23 Encounter for immunization: Secondary | ICD-10-CM | POA: Diagnosis not present

## 2017-05-30 DIAGNOSIS — E785 Hyperlipidemia, unspecified: Secondary | ICD-10-CM | POA: Diagnosis present

## 2017-05-30 DIAGNOSIS — I482 Chronic atrial fibrillation: Secondary | ICD-10-CM | POA: Diagnosis present

## 2017-05-30 DIAGNOSIS — G621 Alcoholic polyneuropathy: Secondary | ICD-10-CM | POA: Diagnosis present

## 2017-05-30 DIAGNOSIS — Z791 Long term (current) use of non-steroidal anti-inflammatories (NSAID): Secondary | ICD-10-CM | POA: Diagnosis not present

## 2017-05-30 DIAGNOSIS — F419 Anxiety disorder, unspecified: Secondary | ICD-10-CM | POA: Diagnosis present

## 2017-05-30 DIAGNOSIS — G4700 Insomnia, unspecified: Secondary | ICD-10-CM | POA: Diagnosis present

## 2017-05-30 DIAGNOSIS — E872 Acidosis: Secondary | ICD-10-CM | POA: Diagnosis present

## 2017-05-30 DIAGNOSIS — Z8249 Family history of ischemic heart disease and other diseases of the circulatory system: Secondary | ICD-10-CM | POA: Diagnosis not present

## 2017-05-30 DIAGNOSIS — R55 Syncope and collapse: Secondary | ICD-10-CM | POA: Diagnosis present

## 2017-05-30 DIAGNOSIS — Z87820 Personal history of traumatic brain injury: Secondary | ICD-10-CM | POA: Diagnosis not present

## 2017-05-30 DIAGNOSIS — Z7901 Long term (current) use of anticoagulants: Secondary | ICD-10-CM | POA: Diagnosis not present

## 2017-05-30 LAB — CBC
HEMATOCRIT: 32 % — AB (ref 39.0–52.0)
HEMOGLOBIN: 10.3 g/dL — AB (ref 13.0–17.0)
MCH: 22.8 pg — AB (ref 26.0–34.0)
MCHC: 32.2 g/dL (ref 30.0–36.0)
MCV: 70.8 fL — AB (ref 78.0–100.0)
PLATELETS: 141 10*3/uL — AB (ref 150–400)
RBC: 4.52 MIL/uL (ref 4.22–5.81)
RDW: 14.4 % (ref 11.5–15.5)
WBC: 4.4 10*3/uL (ref 4.0–10.5)

## 2017-05-30 LAB — BASIC METABOLIC PANEL
Anion gap: 7 (ref 5–15)
BUN: 8 mg/dL (ref 6–20)
CHLORIDE: 112 mmol/L — AB (ref 101–111)
CO2: 24 mmol/L (ref 22–32)
CREATININE: 1.08 mg/dL (ref 0.61–1.24)
Calcium: 7.8 mg/dL — ABNORMAL LOW (ref 8.9–10.3)
GFR calc non Af Amer: >60 mL/min (ref >60)
Glucose, Bld: 92 mg/dL (ref 65–99)
POTASSIUM: 3.3 mmol/L — AB (ref 3.5–5.1)
Sodium: 143 mmol/L (ref 135–145)

## 2017-05-30 MED ORDER — POTASSIUM CHLORIDE CRYS ER 20 MEQ PO TBCR
40.0000 meq | EXTENDED_RELEASE_TABLET | Freq: Once | ORAL | Status: AC
  Administered 2017-05-30: 40 meq via ORAL
  Filled 2017-05-30: qty 2

## 2017-05-30 NOTE — Progress Notes (Signed)
PROGRESS NOTE  Garrett Ferrell RUE:[id number] DOB: September 02, 1960 DOA: 05/29/2017 PCP: Jackie Plum, MD  HPI/Recap of past 24 hours: HPI from Dr Roberto Scales MD on 05/29/17 Garrett Ferrell is a 56 y.o. male with medical history significant for chronic atrial fibrillation on Eliquis, hypertension, traumatic brain injury, schizophrenia/bipolar disorder, depression, anxiety who presents on 05/29/2017 with unwitnessed fall. Wife present at bedside to corroborate history. Patient was in his usual state of health, the day prior to admission he was drinking vodka heavily. Patient states he frequently drinks a bottle every 1-2 days.  While brushing his teeth in the bathroom he turned to walk away and suddenly blacked out.  He cannot remember much more by his history after that.  His wife did not witness his fall, but states she was concerned because his speech sounded slurred when she found him laying on his side on the bathroom floor.  She was worried he may have hit his head after the fall she called EMS.  EMS also noticed a slurred speech and found him to have low blood pressure and symptoms concerning for stroke. In the ED, code stroke was activated, CT head/CT angio head and neck were negative for acute stroke. Neurology consulted. Pt was admitted for further observation and management.  Today, pt reported still feels weak on his bilateral leg, reported speech has improved significantly, but not to his baseline. Still requiring help getting out of bed and walking with assistance which is not usual. Pt denies any chest pain, SOB, abdominal pain, N/V/D/C, fever/chills.  Assessment/Plan: Principal Problem:   Syncope Active Problems:   Atrial fibrillation, chronic (HCC)   Hyperlipidemia   Bipolar disorder (HCC)   Schizophrenia (HCC)   TBI (traumatic brain injury) (HCC)   Unwitnessed fall   Alcohol abuse with intoxication (HCC)   Slurred speech  #?? Micturition/othorstatic Syncope Unclear etiology,  R/O CVA due to slurred speech, LE weakness Hx of remote TBI Unwitnessed fall in the setting of ??acute alcohol intoxication CT head/CT angio head/neck all negative MRI head: negative for any acute process Neurology on board, apprec rec SLP/PT/OT on board Neuro checks, monitor closely  #??Alcoholic neuropathy/myopathy Ethanol level on admission 50 Bilateral LE weakness, slurred speech, likely ??alcohol related PT/OT CIWA protocol Advised to quit alcohol  #Atrial fibrillation, chronic Currently rate controlled Continue diltiazem and Eliquis  #Hyperlipidemia Continue atorvastatin 10 mg  #Bipolar disorder and  Schizophrenia Hx of remote traumatic brain injury though unclear timing Pt needs helps with IADLs and ADLs Continue home Seroquel, Risperdal, trazodone   Code Status: Full  Family Communication: None at bedside  Disposition Plan: Home once stable   Consultants:  Neurology   Procedures:  NOne  Antimicrobials:  None   DVT prophylaxis:  Eliquis   Objective: Vitals:   05/30/17 0217 05/30/17 0558 05/30/17 0911 05/30/17 1313  BP: (!) 134/99 134/84 115/86 (!) 131/99  Pulse: 74 79 85 77  Resp: 16 16  20   Temp: (!) 97.4 F (36.3 C) (!) 97.5 F (36.4 C)  98.1 F (36.7 C)  TempSrc: Oral Oral  Oral  SpO2: 100% 98%  98%  Weight:      Height:        Intake/Output Summary (Last 24 hours) at 05/30/2017 2005 Last data filed at 05/30/2017 0949 Gross per 24 hour  Intake -  Output 550 ml  Net -550 ml   Filed Weights   05/29/17 1215  Weight: 115 kg (253 lb 8.5 oz)    Exam:   General:  AAO X 3, NAD  Cardiovascular: S1-S2 present, no added hrt sound  Respiratory: Chest clear bilaterally  Abdomen: Soft, non-tender, non-distended, BS present  Musculoskeletal: No pedal edema bilaterally   Skin: Normal  Psychiatry: Normal mood  Neuro: 4/5 strength noted in BLE, no other focal neurologic deficit    Data Reviewed: CBC: Recent Labs  Lab  05/29/17 1150 05/29/17 1158 05/30/17 0433  WBC 4.5  --  4.4  NEUTROABS 2.7  --   --   HGB 11.4* 11.9* 10.3*  HCT 34.7* 35.0* 32.0*  MCV 70.7*  --  70.8*  PLT 166  --  141*   Basic Metabolic Panel: Recent Labs  Lab 05/29/17 1150 05/29/17 1158 05/30/17 0433  NA 142 143 143  K 3.9 3.8 3.3*  CL 108 109 112*  CO2 17*  --  24  GLUCOSE 119* 112* 92  BUN 11 11 8   CREATININE 1.45* 1.50* 1.08  CALCIUM 8.7*  --  7.8*   GFR: Estimated Creatinine Clearance: 101.2 mL/min (by C-G formula based on SCr of 1.08 mg/dL). Liver Function Tests: Recent Labs  Lab 05/29/17 1150  AST 28  ALT 18  ALKPHOS 49  BILITOT 0.6  PROT 6.5  ALBUMIN 3.1*   No results for input(s): LIPASE, AMYLASE in the last 168 hours. No results for input(s): AMMONIA in the last 168 hours. Coagulation Profile: Recent Labs  Lab 05/29/17 1150  INR 1.47   Cardiac Enzymes: No results for input(s): CKTOTAL, CKMB, CKMBINDEX, TROPONINI in the last 168 hours. BNP (last 3 results) No results for input(s): PROBNP in the last 8760 hours. HbA1C: No results for input(s): HGBA1C in the last 72 hours. CBG: Recent Labs  Lab 05/29/17 1148  GLUCAP 115*   Lipid Profile: No results for input(s): CHOL, HDL, LDLCALC, TRIG, CHOLHDL, LDLDIRECT in the last 72 hours. Thyroid Function Tests: No results for input(s): TSH, T4TOTAL, FREET4, T3FREE, THYROIDAB in the last 72 hours. Anemia Panel: No results for input(s): VITAMINB12, FOLATE, FERRITIN, TIBC, IRON, RETICCTPCT in the last 72 hours. Urine analysis:    Component Value Date/Time   COLORURINE YELLOW 05/29/2017 1559   APPEARANCEUR CLEAR 05/29/2017 1559   LABSPEC 1.025 05/29/2017 1559   PHURINE 5.0 05/29/2017 1559   GLUCOSEU NEGATIVE 05/29/2017 1559   HGBUR NEGATIVE 05/29/2017 1559   BILIRUBINUR NEGATIVE 05/29/2017 1559   KETONESUR NEGATIVE 05/29/2017 1559   PROTEINUR 30 (A) 05/29/2017 1559   NITRITE NEGATIVE 05/29/2017 1559   LEUKOCYTESUR NEGATIVE 05/29/2017 1559    Sepsis Labs: @LABRCNTIP (procalcitonin:4,lacticidven:4)  )No results found for this or any previous visit (from the past 240 hour(s)).    Studies: Mr Brain Wo Contrast  Result Date: 05/30/2017 CLINICAL DATA:  Follow-up stroke. History of headache, hypertension, hyperlipidemia. EXAM: MRI HEAD WITHOUT CONTRAST TECHNIQUE: Multiplanar, multiecho pulse sequences of the brain and surrounding structures were obtained without intravenous contrast. COMPARISON:  CT HEAD May 29, 2017 FINDINGS: Moderately motion degraded coronal T2 sequence. BRAIN: No reduced diffusion to suggest acute ischemia. No susceptibility artifact to suggest hemorrhage. The ventricles and sulci are normal for patient's age. Scattered subcentimeter supratentorial white matter FLAIR T2 hyperintensities. No suspicious parenchymal signal, mass or mass effect. No abnormal extra-axial fluid collections. VASCULAR: Normal major intracranial vascular flow voids present at skull base. SKULL AND UPPER CERVICAL SPINE: No abnormal sellar expansion. No suspicious calvarial bone marrow signal. Craniocervical junction maintained. SINUSES/ORBITS: Mild bilateral maxillary sinus mucosal thickening. Mastoid air cells are well aerated. The included ocular globes and orbital contents are non-suspicious. OTHER: None. IMPRESSION:  1. No acute intracranial process. 2. Mild white matter changes most compatible with chronic small vessel ischemic disease. Electronically Signed   By: Awilda Metroourtnay  Bloomer M.D.   On: 05/30/2017 18:14    Scheduled Meds: . allopurinol  100 mg Oral Daily  . apixaban  5 mg Oral BID  . atorvastatin  10 mg Oral QPM  . QUEtiapine Fumarate  150 mg Oral QHS  . risperiDONE  2 mg Oral BID  . trazodone  300 mg Oral QHS    Continuous Infusions:   LOS: 0 days     Briant CedarNkeiruka J Ezenduka, MD [facility]   If 7PM-7AM, please contact night-coverage [email or web address] Password [email or web address] 05/30/2017, 8:05 PM

## 2017-05-30 NOTE — Progress Notes (Signed)
Stroke Team Progress Note  SUBJECTIVE  Patient stated that his speech is significantly improved and not get back to baseline. His left-sided strength also appears to be improving. He admits to having consumed significant alcohol the night prior and states that he was unable to pass urine and felt his legs getting weak and giving out and may have passed out. He is still requiring help to get out of bed and walk with assistance which is not his usual baseline  OBJECTIVE Most recent Vital Signs: Temp: 98.1 F (36.7 C) (12/27 1313) Temp Source: Oral (12/27 1313) BP: 131/99 (12/27 1313) Pulse Rate: 77 (12/27 1313) Respiratory Rate: 20 O2 Saturdation: 98%  CBG (last 3)  Recent Labs    05/29/17 1148  GLUCAP 115*    Diet: Diet Heart Room service appropriate? Yes; Fluid consistency: Thin    Activity: Up with assistance   VTE Prophylaxis:  eliquis Studies: Results for orders placed or performed during the hospital encounter of 05/29/17 (from the past 24 hour(s))  Urine rapid drug screen (hosp performed)     Status: Abnormal   Collection Time: 05/29/17  3:59 PM  Result Value Ref Range   Opiates NONE DETECTED NONE DETECTED   Cocaine NONE DETECTED NONE DETECTED   Benzodiazepines POSITIVE (A) NONE DETECTED   Amphetamines NONE DETECTED NONE DETECTED   Tetrahydrocannabinol NONE DETECTED NONE DETECTED   Barbiturates NONE DETECTED NONE DETECTED  Urinalysis, Routine w reflex microscopic     Status: Abnormal   Collection Time: 05/29/17  3:59 PM  Result Value Ref Range   Color, Urine YELLOW YELLOW   APPearance CLEAR CLEAR   Specific Gravity, Urine 1.025 1.005 - 1.030   pH 5.0 5.0 - 8.0   Glucose, UA NEGATIVE NEGATIVE mg/dL   Hgb urine dipstick NEGATIVE NEGATIVE   Bilirubin Urine NEGATIVE NEGATIVE   Ketones, ur NEGATIVE NEGATIVE mg/dL   Protein, ur 30 (A) NEGATIVE mg/dL   Nitrite NEGATIVE NEGATIVE   Leukocytes, UA NEGATIVE NEGATIVE   RBC / HPF 0-5 0 - 5 RBC/hpf   WBC, UA 0-5 0 - 5  WBC/hpf   Bacteria, UA NONE SEEN NONE SEEN   Squamous Epithelial / LPF 0-5 (A) NONE SEEN   Hyaline Casts, UA PRESENT   Basic metabolic panel     Status: Abnormal   Collection Time: 05/30/17  4:33 AM  Result Value Ref Range   Sodium 143 135 - 145 mmol/L   Potassium 3.3 (L) 3.5 - 5.1 mmol/L   Chloride 112 (H) 101 - 111 mmol/L   CO2 24 22 - 32 mmol/L   Glucose, Bld 92 65 - 99 mg/dL   BUN 8 6 - 20 mg/dL   Creatinine, Ser 6.641.08 0.61 - 1.24 mg/dL   Calcium 7.8 (L) 8.9 - 10.3 mg/dL   GFR calc non Af Amer >60 >60 mL/min   GFR calc Af Amer >60 >60 mL/min   Anion gap 7 5 - 15  CBC     Status: Abnormal   Collection Time: 05/30/17  4:33 AM  Result Value Ref Range   WBC 4.4 4.0 - 10.5 K/uL   RBC 4.52 4.22 - 5.81 MIL/uL   Hemoglobin 10.3 (L) 13.0 - 17.0 g/dL   HCT 40.332.0 (L) 47.439.0 - 25.952.0 %   MCV 70.8 (L) 78.0 - 100.0 fL   MCH 22.8 (L) 26.0 - 34.0 pg   MCHC 32.2 30.0 - 36.0 g/dL   RDW 56.314.4 87.511.5 - 64.315.5 %   Platelets 141 (L) 150 -  400 K/uL     Ct Angio Head W Or Wo Contrast  Result Date: 05/29/2017 CLINICAL DATA:  56 year old male code stroke. Left side weakness and slurred speech. EXAM: CT ANGIOGRAPHY HEAD AND NECK TECHNIQUE: Multidetector CT imaging of the head and neck was performed using the standard protocol during bolus administration of intravenous contrast. Multiplanar CT image reconstructions and MIPs were obtained to evaluate the vascular anatomy. Carotid stenosis measurements (when applicable) are obtained utilizing NASCET criteria, using the distal internal carotid diameter as the denominator. CONTRAST:  ISOVUE-370 IOPAMIDOL (ISOVUE-370) INJECTION 76% COMPARISON:  Head CT without contrast 1202 hours today. FINDINGS: CTA NECK Skeleton: No acute osseous abnormality identified. Visualized paranasal sinuses and mastoids are stable and well pneumatized. Upper chest: Negative visualized upper lungs. Negative visualized superior mediastinum aside from mild lipomatosis. Partially visible  subpectoral implants. Other neck: Negative.  No cervical lymphadenopathy. Aortic arch: No aortic arch atherosclerosis or great vessel origin stenosis. Incidental distal location of the left subclavian origin from the arch (series 10, image 136). Right carotid system: Negative brachiocephalic artery and right CCA origin. Negative right CCA. Minimal soft atherosclerotic plaque at the anterior right carotid bifurcation near the ECA origin. Widely patent right ICA in the neck to the skullbase. Left carotid system: Negative left CCA aside from mild tortuosity. Negative left carotid bifurcation and cervical left ICA. Vertebral arteries: Normal proximal right subclavian artery. Normal right vertebral artery origin. Mild obscuration of some of the right V1 segment related to paravertebral venous contrast. The right vertebral artery appears normal to the skullbase. Normal left CCA origin aside from somewhat distal positioning from the arch. Subsequently there is mild tortuosity of the proximal left subclavian. Normal left vertebral artery origin. Mildly non dominant left vertebral artery is normal to the skullbase. CTA HEAD Posterior circulation: Normal distal vertebral arteries. Patent PICA origins. Mildly fenestrated vertebrobasilar junction (normal variant) without stenosis. Patent basilar artery without stenosis. Normal SCA and PCA origins. Posterior communicating arteries are diminutive or absent. Bilateral PCA branches are within normal limits. Anterior circulation: Patent ICA siphons. Minimal calcified plaque on the left. No siphon stenosis. Normal ophthalmic artery origins. Patent carotid termini. Normal MCA and ACA origins. Mildly tortuous A1 segments. Diminutive or absent anterior communicating artery. Bilateral ACA branches are within normal limits. Left MCA M1 segment, left MCA bifurcation, and visible left MCA branches are within normal limits. Slightly early arterial enhancement timing at the MCA branches.  Right MCA M1 segment, right MCA bifurcation, and visible right MCA branches are within normal limits. Venous sinuses: Early contrast timing, but there is early enhancement of the major dural venous sinuses suggesting patency. Anatomic variants: Mildly dominant right vertebral artery. Fenestrated vertebrobasilar junction. Somewhat distal origin of the left subclavian artery from the aortic arch. Review of the MIP images confirms the above findings IMPRESSION: 1. Negative for emergent large vessel occlusion. 2. Somewhat early contrast timing for the bilateral distal MCA branches. No MCA branch occlusion is identified. 3. Only minimal atherosclerosis identified in the head and neck. No arterial stenosis identified. A preliminary report of the above was communicated to Dr. Pearlean Brownie at 1234 hours on 12/26/2018by text page via the [facility] messaging system. Electronically Signed   By: Odessa Fleming M.D.   On: 05/29/2017 12:42   Ct Angio Neck W Or Wo Contrast  Result Date: 05/29/2017 CLINICAL DATA:  56 year old male code stroke. Left side weakness and slurred speech. EXAM: CT ANGIOGRAPHY HEAD AND NECK TECHNIQUE: Multidetector CT imaging of the head and neck was  performed using the standard protocol during bolus administration of intravenous contrast. Multiplanar CT image reconstructions and MIPs were obtained to evaluate the vascular anatomy. Carotid stenosis measurements (when applicable) are obtained utilizing NASCET criteria, using the distal internal carotid diameter as the denominator. CONTRAST:  100mL ISOVUE-370 IOPAMIDOL (ISOVUE-370) INJECTION 76% COMPARISON:  Head CT without contrast 1202 hours today. FINDINGS: CTA NECK Skeleton: No acute osseous abnormality identified. Visualized paranasal sinuses and mastoids are stable and well pneumatized. Upper chest: Negative visualized upper lungs. Negative visualized superior mediastinum aside from mild lipomatosis. Partially visible subpectoral implants. Other neck: Negative.  No  cervical lymphadenopathy. Aortic arch: No aortic arch atherosclerosis or great vessel origin stenosis. Incidental distal location of the left subclavian origin from the arch (series 10, image 136). Right carotid system: Negative brachiocephalic artery and right CCA origin. Negative right CCA. Minimal soft atherosclerotic plaque at the anterior right carotid bifurcation near the ECA origin. Widely patent right ICA in the neck to the skullbase. Left carotid system: Negative left CCA aside from mild tortuosity. Negative left carotid bifurcation and cervical left ICA. Vertebral arteries: Normal proximal right subclavian artery. Normal right vertebral artery origin. Mild obscuration of some of the right V1 segment related to paravertebral venous contrast. The right vertebral artery appears normal to the skullbase. Normal left CCA origin aside from somewhat distal positioning from the arch. Subsequently there is mild tortuosity of the proximal left subclavian. Normal left vertebral artery origin. Mildly non dominant left vertebral artery is normal to the skullbase. CTA HEAD Posterior circulation: Normal distal vertebral arteries. Patent PICA origins. Mildly fenestrated vertebrobasilar junction (normal variant) without stenosis. Patent basilar artery without stenosis. Normal SCA and PCA origins. Posterior communicating arteries are diminutive or absent. Bilateral PCA branches are within normal limits. Anterior circulation: Patent ICA siphons. Minimal calcified plaque on the left. No siphon stenosis. Normal ophthalmic artery origins. Patent carotid termini. Normal MCA and ACA origins. Mildly tortuous A1 segments. Diminutive or absent anterior communicating artery. Bilateral ACA branches are within normal limits. Left MCA M1 segment, left MCA bifurcation, and visible left MCA branches are within normal limits. Slightly early arterial enhancement timing at the MCA branches. Right MCA M1 segment, right MCA bifurcation, and  visible right MCA branches are within normal limits. Venous sinuses: Early contrast timing, but there is early enhancement of the major dural venous sinuses suggesting patency. Anatomic variants: Mildly dominant right vertebral artery. Fenestrated vertebrobasilar junction. Somewhat distal origin of the left subclavian artery from the aortic arch. Review of the MIP images confirms the above findings IMPRESSION: 1. Negative for emergent large vessel occlusion. 2. Somewhat early contrast timing for the bilateral distal MCA branches. No MCA branch occlusion is identified. 3. Only minimal atherosclerosis identified in the head and neck. No arterial stenosis identified. A preliminary report of the above was communicated to Dr. Pearlean BrownieSethi at 1234 hours on 12/26/2018by text page via the [facility] messaging system. Electronically Signed   By: Odessa FlemingH  Hall M.D.   On: 05/29/2017 12:42   Ct Head Code Stroke Wo Contrast  Result Date: 05/29/2017 CLINICAL DATA:  Code stroke. Left-sided weakness and slurred speech. EXAM: CT HEAD WITHOUT CONTRAST TECHNIQUE: Contiguous axial images were obtained from the base of the skull through the vertex without intravenous contrast. COMPARISON:  None. FINDINGS: Brain: There is no evidence of acute infarct, intracranial hemorrhage, mass, midline shift, or extra-axial fluid collection. The ventricles and sulci are normal. Vascular: Mild calcified atherosclerosis in the carotid siphons. No hyperdense vessel. Skull: No fracture or focal  osseous lesion. Sinuses/Orbits: The visualized paranasal sinuses and mastoid air cells are clear. Cerumen is noted in the external auditory canals. The orbits are unremarkable. Other: None. ASPECTS [facility] Stroke Program Early CT Score) - Ganglionic level infarction (caudate, lentiform nuclei, internal capsule, insula, M1-M3 cortex): 7 - Supraganglionic infarction (M4-M6 cortex): 3 Total score (0-10 with 10 being normal): 10 IMPRESSION: 1. No evidence of acute intracranial  abnormality. Unremarkable CT appearance of the brain. 2. ASPECTS is 10. These results were communicated to Dr. Pearlean Brownie at 12:16 pmon 05/29/2017 by text page via the [facility] messaging system. Electronically Signed   By: Sebastian Ache M.D.   On: 05/29/2017 12:16    Physical Exam:    Obese middle-aged African-American male not in distress. . Afebrile. Head is nontraumatic. Neck is supple without bruit.    Cardiac exam no murmur or gallop. Lungs are clear to auscultation. Distal pulses are well felt. Neurological exam Awake alert oriented 3. Diminished attention registration and recall. Follows commands well. No dysarthria but voices hesitant and slightly nonfluent. Able to name and repeat and comprehend quite well. Extraocular moments are full range without nystagmus. Fundi were not visualized. Vision acuity seems adequate. Partial left visual field defect to bedside confrontational testing ( chronic as per wife). No facial weakness. Tongue midline. Motor system exam no upper extremity drift. Mild weakness in that prescription but doubt patient's effort. Mild left lower extremity proximal weakness but complains of pain in that limb. Reflexes are 1+ symmetric plantars downgoing   ASSESSMENT Mr. Haydn Hutsell is a 56 year old African-American male presents with a fall at home possibly a micturition syncope given the circumstances followed by transient slurred speech and left sided weakness which appears to be improving. Patient has history of remote traumatic brain injury and mild residual deficits from that. Brain imaging shows no acute abnormality and CT angiogram shows no significant large vessel stenosis or occlusion. I doubt that this represents a new stroke. It could represent transient worsening of old deficits in the setting of an acute stress    Hospital day # 0  TREATMENT/PLAN Recommend check MRI scan of the brain since patient's symptoms have not returned back to baseline. Continue physical  occupational and speech therapy. Continue eliquis for stroke prevention. No family available at the bedside for discussion. Greater than 50% time during this 25 minute visit was spent on counseling and coordination of care about his speech difficulties and weakness and answering questions  Delia Heady, MD [facility] Pager: [phone] 05/30/2017 2:42 PM

## 2017-05-30 NOTE — Progress Notes (Signed)
MD was notified of blood pressure of 115/86 and HR of 85. She was asked if it is okay to give the blood pressure medicine. I just wanted to make sure I don't drop it too low. MD agreed to hold the medicine.

## 2017-05-30 NOTE — Progress Notes (Signed)
I was unable to get orthostatic VS on Garrett Ferrell, the night shift nurse will be notified.

## 2017-05-30 NOTE — Progress Notes (Addendum)
PT Cancellation Note  Patient Details Name: Garrett Ferrell MRN: [id number] DOB: 02/27/1961   Cancelled Treatment:    Reason Eval/Treat Not Completed: Patient at procedure or test/unavailable, at MRI at this time, will re attempt next day   Fabio AsaDevon J Jalayne Ganesh 05/30/2017, 4:42 PM

## 2017-05-31 LAB — CBC WITH DIFFERENTIAL/PLATELET
Basophils Absolute: 0 10*3/uL (ref 0.0–0.1)
Basophils Relative: 0 %
EOS PCT: 2 %
Eosinophils Absolute: 0.1 10*3/uL (ref 0.0–0.7)
HEMATOCRIT: 34 % — AB (ref 39.0–52.0)
Hemoglobin: 10.9 g/dL — ABNORMAL LOW (ref 13.0–17.0)
LYMPHS ABS: 1.3 10*3/uL (ref 0.7–4.0)
LYMPHS PCT: 37 %
MCH: 23.1 pg — AB (ref 26.0–34.0)
MCHC: 32.1 g/dL (ref 30.0–36.0)
MCV: 72 fL — AB (ref 78.0–100.0)
MONO ABS: 0.2 10*3/uL (ref 0.1–1.0)
MONOS PCT: 6 %
Neutro Abs: 2 10*3/uL (ref 1.7–7.7)
Neutrophils Relative %: 55 %
PLATELETS: 150 10*3/uL (ref 150–400)
RBC: 4.72 MIL/uL (ref 4.22–5.81)
RDW: 14.8 % (ref 11.5–15.5)
WBC: 3.6 10*3/uL — ABNORMAL LOW (ref 4.0–10.5)

## 2017-05-31 LAB — BASIC METABOLIC PANEL
Anion gap: 6 (ref 5–15)
BUN: 6 mg/dL (ref 6–20)
CALCIUM: 8.3 mg/dL — AB (ref 8.9–10.3)
CO2: 26 mmol/L (ref 22–32)
Chloride: 109 mmol/L (ref 101–111)
Creatinine, Ser: 1.1 mg/dL (ref 0.61–1.24)
GFR calc Af Amer: >60 mL/min (ref >60)
GLUCOSE: 106 mg/dL — AB (ref 65–99)
Potassium: 3.5 mmol/L (ref 3.5–5.1)
Sodium: 141 mmol/L (ref 135–145)

## 2017-05-31 MED ORDER — RISPERIDONE 2 MG PO TABS
2.0000 mg | ORAL_TABLET | Freq: Every day | ORAL | Status: DC
  Administered 2017-05-31: 2 mg via ORAL
  Filled 2017-05-31: qty 1

## 2017-05-31 MED ORDER — DILTIAZEM HCL ER COATED BEADS 240 MG PO CP24
240.0000 mg | ORAL_CAPSULE | Freq: Every day | ORAL | Status: DC
  Administered 2017-05-31 – 2017-06-01 (×2): 240 mg via ORAL
  Filled 2017-05-31 (×2): qty 1

## 2017-05-31 MED ORDER — RISPERIDONE 1 MG PO TABS: 1.0000 mg | ORAL_TABLET | Freq: Two times a day (BID) | ORAL | Status: DC

## 2017-05-31 MED ORDER — RISPERIDONE 1 MG PO TABS
1.0000 mg | ORAL_TABLET | Freq: Every day | ORAL | Status: DC
  Administered 2017-06-01: 1 mg via ORAL
  Filled 2017-05-31: qty 1

## 2017-05-31 MED ORDER — QUETIAPINE FUMARATE 25 MG PO TABS
50.0000 mg | ORAL_TABLET | Freq: Every day | ORAL | Status: DC
  Administered 2017-05-31: 50 mg via ORAL
  Filled 2017-05-31: qty 2

## 2017-05-31 NOTE — Evaluation (Signed)
Clinical/Bedside Swallow Evaluation Patient Details  Name: Garrett Ferrell MRN: [id number] Date of Birth: 05/14/1961  Today's Date: 05/31/2017 Time: SLP Start Time (ACUTE ONLY): 0841 SLP Stop Time (ACUTE ONLY): 0855 SLP Time Calculation (min) (ACUTE ONLY): 14 min  Past Medical History:  Past Medical History:  Diagnosis Date  . Anemia    takes Iron pill daily  . Anxiety    takes Xanax daily as needed  . Arthritis   . Atrial fibrillation (HCC)    takes Eliquis daily. Last dose will be 10/03/16  . Depression    takes Risperdal daily  . DVT (deep venous thrombosis) (HCC)    both legs in 2016  . Dyspnea    when laying on back  . Gout    takes Allopurinol daily  . Headache    occasionally  . History of shingles 2010  . Hyperlipidemia    takes Atorvastatin daily  . Hyperlipidemia    takes Atorvastatin daily  . Hypertension    takes Metoprolol daily  . Insomnia    takes Ambien and Trazodone nightly  . Joint pain   . Joint swelling    right ankle  . 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 Nocturia   . Seizures (HCC)    last one in 2002   Past Surgical History:  Past Surgical History:  Procedure Laterality Date  . ANKLE ARTHROSCOPY Right 04/20/2016   Procedure: ANKLE ARTHROSCOPY;  Surgeon: Nadara MustardMarcus V Duda, MD;  [facility];  Service: Orthopedics;  Laterality: Right;  . ANKLE ARTHROSCOPY WITH FUSION Right 10/05/2016   Procedure: RIGHT ANKLE ARTHROSCOPIC FUSION;  Surgeon: Nadara Mustarduda, Marcus V, MD;  [facility];  Service: Orthopedics;  Laterality: Right;  . ANKLE FUSION Right 04/20/2016   Procedure: Right Posterior Arthroscopic Subtalar Arthrodesis;  Surgeon: Nadara MustardMarcus V Duda, MD;  [facility];  Service: Orthopedics;  Laterality: Right;  . ANKLE SURGERY Right    plates/screws  . COLONOSCOPY WITH PROPOFOL N/A 11/27/2016   Procedure: COLONOSCOPY WITH PROPOFOL;  Surgeon: Charolett BumpersJohnson, Martin K, MD;  Location: [facility];  Service: Endoscopy;  Laterality: N/A;  . HERNIA REPAIR     1986-umbilical  . RIGHT [facility]ART [facility]  [facility]PE   HPI:  Garrett GeninJerome Youngis a 56 y.o.malewith medical history significant for significant ETOH abuse (drinks bottle vodka every 1-2 days), chronic atrial fibrillation on Eliquis, hypertension, traumatic brain injury, schizophrenia/bipolar disorder, depression, anxiety who presents on 12/26/2018with unwitnessed fall and slurred speech. CT head and CT angios of head and neck were negative for any acute stroke. MRI no acute intracranial process.   Assessment / Plan / Recommendation Clinical Impression  Pt admitted s/p unwitnessed fall in the setting of acute alcohol intoxication with increased dysarthria and possibility of head trauma although brain scans clear. He denied hx of GERD or esophageal discomfort/symptoms. Oral and pharyngeal phases of swallow WFL's with continuous straw sips thin. Reconmmend continue regular texture/thin,  pills with thin, straws allowed. No further ST needed.   SLP Visit Diagnosis: Dysphagia, unspecified (R13.10)    Aspiration Risk  Mild aspiration risk    Diet Recommendation Regular;Thin liquid   Liquid Administration via: Straw;Cup Medication Administration: Whole meds with liquid Supervision: Patient able to self feed Compensations: Slow rate;Small sips/bites Postural Changes: Seated upright at 90 degrees    Other  Recommendations Oral Care Recommendations: Oral care BID   Follow up Recommendations None      Frequency and  Duration            Prognosis        Swallow Study   General HPI: Garrett GeninJerome Youngis a 56 y.o.malewith medical history significant for significant ETOH abuse (drinks bottle vodka every 1-2 days), chronic atrial fibrillation on Eliquis, hypertension, traumatic brain injury, schizophrenia/bipolar disorder, depression, anxiety who presents on 12/26/2018with unwitnessed fall and slurred speech. CT head and CT angios of head and neck were  negative for any acute stroke. MRI no acute intracranial process. Type of Study: Bedside Swallow Evaluation Previous Swallow Assessment: (none) Diet Prior to this Study: Regular;Thin liquids Temperature Spikes Noted: No Respiratory Status: Room air History of Recent Intubation: No Behavior/Cognition: Pleasant mood;Cooperative;Requires cueing;Alert("fog-like state") Oral Cavity Assessment: Within Functional Limits Oral Care Completed by SLP: No Oral Cavity - Dentition: Adequate natural dentition Vision: Functional for self-feeding Self-Feeding Abilities: Able to feed self Patient Positioning: Upright in bed Baseline Vocal Quality: Normal Volitional Cough: Strong Volitional Swallow: Able to elicit    Oral/Motor/Sensory Function Overall Oral Motor/Sensory Function: Within functional limits   Ice Chips Ice chips: Not tested   Thin Liquid Thin Liquid: Within functional limits Presentation: Straw    Nectar Thick Nectar Thick Liquid: Not tested   Honey Thick Honey Thick Liquid: Not tested   Puree Puree: Not tested   Solid   GO   Solid: Within functional limits        Garrett Ferrell, Garrett Ferrell 05/31/2017,9:14 AM  Garrett Ferrell M.Ed [employer] Pager [phone]

## 2017-05-31 NOTE — Progress Notes (Signed)
Stroke Team Progress Note  SUBJECTIVE  Patient stated that his speech is significantly improved and allmost back to baseline. His left-sided strength also appears to be improving.MRI scan of brain shows no acute infarct OBJECTIVE Most recent Vital Signs: Temp: 98.3 F (36.8 C) (12/28 0435) Temp Source: Oral (12/28 0435) BP: 149/96 (12/28 0435) Pulse Rate: 83 (12/28 0435) Respiratory Rate: 17 O2 Saturdation: 100%  CBG (last 3)  Recent Labs    05/29/17 1148  GLUCAP 115*    Diet: Diet Heart Room service appropriate? Yes; Fluid consistency: Thin    Activity: Up with assistance   VTE Prophylaxis:  eliquis Studies: Results for orders placed or performed during the hospital encounter of 05/29/17 (from the past 24 hour(s))  CBC with Differential/Platelet     Status: Abnormal   Collection Time: 05/31/17  6:45 AM  Result Value Ref Range   WBC 3.6 (L) 4.0 - 10.5 K/uL   RBC 4.72 4.22 - 5.81 MIL/uL   Hemoglobin 10.9 (L) 13.0 - 17.0 g/dL   HCT 40.934.0 (L) 81.139.0 - 91.452.0 %   MCV 72.0 (L) 78.0 - 100.0 fL   MCH 23.1 (L) 26.0 - 34.0 pg   MCHC 32.1 30.0 - 36.0 g/dL   RDW 78.214.8 95.611.5 - 21.315.5 %   Platelets 150 150 - 400 K/uL   Neutrophils Relative % 55 %   Neutro Abs 2.0 1.7 - 7.7 K/uL   Lymphocytes Relative 37 %   Lymphs Abs 1.3 0.7 - 4.0 K/uL   Monocytes Relative 6 %   Monocytes Absolute 0.2 0.1 - 1.0 K/uL   Eosinophils Relative 2 %   Eosinophils Absolute 0.1 0.0 - 0.7 K/uL   Basophils Relative 0 %   Basophils Absolute 0.0 0.0 - 0.1 K/uL  Basic metabolic panel     Status: Abnormal   Collection Time: 05/31/17  6:45 AM  Result Value Ref Range   Sodium 141 135 - 145 mmol/L   Potassium 3.5 3.5 - 5.1 mmol/L   Chloride 109 101 - 111 mmol/L   CO2 26 22 - 32 mmol/L   Glucose, Bld 106 (H) 65 - 99 mg/dL   BUN 6 6 - 20 mg/dL   Creatinine, Ser 0.861.10 0.61 - 1.24 mg/dL   Calcium 8.3 (L) 8.9 - 10.3 mg/dL   GFR calc non Af Amer >60 >60 mL/min   GFR calc Af Amer >60 >60 mL/min   Anion gap 6 5 - 15     Mr Brain Wo Contrast  Result Date: 05/30/2017 CLINICAL DATA:  Follow-up stroke. History of headache, hypertension, hyperlipidemia. EXAM: MRI HEAD WITHOUT CONTRAST TECHNIQUE: Multiplanar, multiecho pulse sequences of the brain and surrounding structures were obtained without intravenous contrast. COMPARISON:  CT HEAD May 29, 2017 FINDINGS: Moderately motion degraded coronal T2 sequence. BRAIN: No reduced diffusion to suggest acute ischemia. No susceptibility artifact to suggest hemorrhage. The ventricles and sulci are normal for patient's age. Scattered subcentimeter supratentorial white matter FLAIR T2 hyperintensities. No suspicious parenchymal signal, mass or mass effect. No abnormal extra-axial fluid collections. VASCULAR: Normal major intracranial vascular flow voids present at skull base. SKULL AND UPPER CERVICAL SPINE: No abnormal sellar expansion. No suspicious calvarial bone marrow signal. Craniocervical junction maintained. SINUSES/ORBITS: Mild bilateral maxillary sinus mucosal thickening. Mastoid air cells are well aerated. The included ocular globes and orbital contents are non-suspicious. OTHER: None. IMPRESSION: 1. No acute intracranial process. 2. Mild white matter changes most compatible with chronic small vessel ischemic disease. Electronically Signed   By: Pernell Dupreourtnay  Bloomer M.D.   On: 05/30/2017 18:14    Physical Exam:    Obese middle-aged African-American male not in distress. . Afebrile. Head is nontraumatic. Neck is supple without bruit.    Cardiac exam no murmur or gallop. Lungs are clear to auscultation. Distal pulses are well felt. Neurological exam Awake alert oriented 3. Diminished attention registration and recall. Follows commands well. No dysarthria but voices hesitant and slightly nonfluent. Able to name and repeat and comprehend quite well. Extraocular moments are full range without nystagmus. Fundi were not visualized. Vision acuity seems adequate. Partial left  visual field defect to bedside confrontational testing ( chronic as per wife). No facial weakness. Tongue midline. Motor system exam no upper extremity drift. Mild weakness in that prescription but doubt patient's effort. Mild left lower extremity proximal weakness but complains of pain in that limb. Reflexes are 1+ symmetric plantars downgoing   ASSESSMENT Garrett Ferrell is a 56 year old African-American male presents with a fall at home possibly a micturition syncope given the circumstances followed by transient slurred speech and left sided weakness which appears to be improving. Patient has history of remote traumatic brain injury and mild residual deficits from that. Brain imaging shows no acute abnormality and CT angiogram shows no significant large vessel stenosis or occlusion. I doubt that this represents a new stroke. It could represent transient worsening of old deficits in the setting of an acute stress    Hospital day # 1  TREATMENT/PLAN Recommend  . Continue physical occupational and speech therapy. Continue eliquis for stroke prevention. No family available at the bedside for discussion.  Stroke team will sign off. Kindly call for questions if any discuss with Dr. Vista LawmanJoseph  Pramod Sethi, MD [facility] Pager: [phone] 05/31/2017 1:53 PM

## 2017-05-31 NOTE — Evaluation (Signed)
Occupational Therapy Evaluation Patient Details Name: Garrett Ferrell MRN: [id number] DOB: 01/20/1961 Today's Date: 05/31/2017    History of Present Illness [name] is a 56 year old African-American male presents with a fall at home possibly a micturition syncope followed by transient slurred speech and left sided weakness. Patient has history of TBI and mild residual deficits from that (partial left visual field defect), chronic atrial fibrillation on Eliquis, hypertension, schizophrenia/bipolar disorder, depression, anxiety. Brain imaging shows no acute abnormality and CT angiogram shows no significant large vessel stenosis or occlusion.   Clinical Impression   PTA pt modified independent in ADL and generalized supervision for mobility for safety (per chart review) Pt is min guard for safety for transfers and supervision for ADL - Pt able to perform LB dressing, toilet transfer, peri care, sink level grooming (including fine motor and bilateral hand coordination). Pt at baseline and no further OT needed, no differences between left and right strength and functional during session. Education complete and OT to sign off. Thank you for the opportunity to serve this patient.     Follow Up Recommendations  Supervision/Assistance - 24 hour    Equipment Recommendations  None recommended by OT    Recommendations for Other Services       Precautions / Restrictions Precautions Precautions: Fall Restrictions Weight Bearing Restrictions: No      Mobility Bed Mobility Overal bed mobility: Needs Assistance Bed Mobility: Supine to Sit;Sit to Supine     Supine to sit: Supervision Sit to supine: Supervision   General bed mobility comments: slow to move through task  Transfers Overall transfer level: Needs assistance   Transfers: Sit to/from Stand Sit to Stand: Supervision              Balance Overall balance assessment: Needs assistance Sitting-balance support: No upper  extremity supported;Feet supported Sitting balance-Leahy Scale: Good Sitting balance - Comments: sways, but no overt LOB   Standing balance support: No upper extremity supported Standing balance-Leahy Scale: Fair                             ADL either performed or assessed with clinical judgement   ADL Overall ADL's : At baseline                                       General ADL Comments: Close min guard for safety, but Pt able to don/doff socks, toilet transfer, standing grooming tasks (including fine motor manipulation) at sink - without assist. Extra time required      Vision Baseline Vision/History: Wears glasses Wears Glasses: At all times Patient Visual Report: No change from baseline       Perception     Praxis      Pertinent Vitals/Pain Pain Assessment: 0-10 Pain Score: 9  Pain Location: R ankle Pain Descriptors / Indicators: Guarding Pain Intervention(s): Monitored during session;Repositioned;Patient requesting pain meds-RN notified     Hand Dominance Right   Extremity/Trunk Assessment Upper Extremity Assessment Upper Extremity Assessment: Overall WFL for tasks assessed(no differences noted between L and R, increased time req)   Lower Extremity Assessment Lower Extremity Assessment: Defer to PT evaluation       Communication Communication Communication: Other (comment)(suspect Pt is at baseline)   Cognition Arousal/Alertness: Awake/alert Behavior During Therapy: Flat affect Overall Cognitive Status: History of cognitive impairments - at baseline  General Comments  Pt pleasant and cooperative throughout session. Pt requires increased time for activities - suspect baseline    Exercises     Shoulder Instructions      Home Living Family/patient expects to be discharged to:: Private residence Living Arrangements: Spouse/significant other;Children(4,9,12) Available Help at  Discharge: Family Type of Home: House Home Access: Stairs to enter Secretary/administratorntrance Stairs-Number of Steps: 3 Entrance Stairs-Rails: Right;Left;Can reach both Home Layout: One level     Bathroom Shower/Tub: Chief Strategy OfficerTub/shower unit   Bathroom Toilet: Standard     Home Equipment: Crutches          Prior Functioning/Environment Level of Independence: Needs assistance  Gait / Transfers Assistance Needed: Independent ambulation.  Supervision for mild cognitive deficits ADL's / Homemaking Assistance Needed: per pt he can do all ADLs            OT Problem List:        OT Treatment/Interventions:      OT Goals(Current goals can be found in the care plan section) Acute Rehab OT Goals Patient Stated Goal: get home OT Goal Formulation: With patient Time For Goal Achievement: 06/14/17 Potential to Achieve Goals: Good  OT Frequency:     Barriers to D/C:            Co-evaluation              AM-PAC PT "6 Clicks" Daily Activity     Outcome Measure Help from another person eating meals?: None Help from another person taking care of personal grooming?: None Help from another person toileting, which includes using toliet, bedpan, or urinal?: None Help from another person bathing (including washing, rinsing, drying)?: A Little(for safety) Help from another person to put on and taking off regular upper body clothing?: None Help from another person to put on and taking off regular lower body clothing?: None 6 Click Score: 23   End of Session Equipment Utilized During Treatment: Gait belt Nurse Communication: Mobility status(in room at the end of session administering pain meds)  Activity Tolerance: Patient tolerated treatment well Patient left: in bed;with call bell/phone within reach;with bed alarm set                   Time: [phone] OT Time Calculation (min): 23 min Charges:  OT General Charges $OT Visit: 1 Visit OT Evaluation $OT Eval Low Complexity: 1 Low OT  Treatments $Self Care/Home Management : 8-22 mins G-Codes:     Sherryl MangesLaura Dallan Ferrell OTR/L [phone]  Garrett BioLaura J Birdie Ferrell 05/31/2017, 1:26 PM

## 2017-05-31 NOTE — Progress Notes (Signed)
Physical Therapy Treatment Patient Details Name: Garrett Ferrell CodeJerome Zimmers MRN: [id number] DOB: 06/17/1960 Today's Date: 05/31/2017    History of Present Illness 56 y.o. male presents with a fall at home possibly a micturition syncope followed by transient slurred speech and left sided weakness. Patient has history of TBI and mild residual deficits from that (partial left visual field defect), chronic A-fib on Eliquis, HTN, schizophrenia/bipolar disorder, depression, anxiety. Brain imaging shows no acute abnormality and CT angiogram shows no significant large vessel stenosis or occlusion.    PT Comments    Pt tolerates treatment well, ambulating 200-ft with no assistive device and min guard for safety, as pt reports feeling "drunk".  BP seated EOB: 133/92, BP standing EOB: 140/109, BP post-ambulation (standing): 156/116. Nurse notified of BP changes. Pt reporting dizziness and light-headedness. Pt's wife in room during session, reporting baseline memory issues. Pt with variable behavior throughout session, requesting to sit in chair post-ambulation, then asking to get up to go to the bathroom. While in bathroom pt turns on light as well as pulls emergency button. Upon returning to chair and sitting down, pt requests to lay in bed. Wife does not appear to be alarmed by behavior, indicating it might be his baseline. Current discharge plan remains appropriate. PT will follow acutely to ensure safe mobility while in hospital setting.    Follow Up Recommendations  No PT follow up     Equipment Recommendations  None recommended by PT    Recommendations for Other Services       Precautions / Restrictions Precautions Precautions: Fall Restrictions Weight Bearing Restrictions: No    Mobility  Bed Mobility Overal bed mobility: Needs Assistance Bed Mobility: Supine to Sit;Sit to Supine     Supine to sit: Min guard;HOB elevated Sit to supine: Supervision;HOB elevated   General bed mobility comments: Pt  demonstrates LOB while sitting up after supine to sit, requiring min guard assist to steady. Pt requiring increased time for bed mobility. Pt in bed post-ambulation per pt request.    Transfers Overall transfer level: Needs assistance   Transfers: Sit to/from Stand Sit to Stand: Min guard         General transfer comment: sit-to-stand x1 from EOB, x2 from chair. min guard for safety, as pt notes dizziness and lightheadedness after standing.   Ambulation/Gait Ambulation/Gait assistance: Min guard Ambulation Distance (Feet): 200 Feet Assistive device: None Gait Pattern/deviations: Step-through pattern;Decreased stride length;Drifts right/left Gait velocity: decreased   General Gait Details: Pt barely clears feet from floor and at times slides feet forward to progress during ambulation. Pt with guarded gait pattern and not much movement. Pt reports that ankle pain remains at an 8 during ambulation. Min guard for safety as pt reports feeling "drunk".    Stairs            Wheelchair Mobility    Modified Rankin (Stroke Patients Only)       Balance Overall balance assessment: Needs assistance Sitting-balance support: No upper extremity supported;Feet supported Sitting balance-Leahy Scale: Good Sitting balance - Comments: Pt sits EOB without external support, however drifts posteriorly and to the right at times.  Able to correct when given commands.    Standing balance support: No upper extremity supported Standing balance-Leahy Scale: Fair Standing balance comment: Pt able to stand statically without external support, however is unsteady during dynamic activities. Pt leans on sink during hand washing and grabs for items around hospital room with room-distance ambulation.  Cognition Arousal/Alertness: Awake/alert Behavior During Therapy: Flat affect Overall Cognitive Status: Impaired/Different from baseline Area of Impairment: Problem  solving;Memory;Safety/judgement                     Memory: Decreased short-term memory   Safety/Judgement: Decreased awareness of safety   Problem Solving: Slow processing;Difficulty sequencing;Requires verbal cues;Decreased initiation General Comments: Pt with baseline memory issues per wife. Pt could not recall walking down the hall with PT. Pt turns on bathroom light and pulls emergency string without reason before standing up to urinate. Pt leaning on sink when washing hands, although pt is able to stand without external support.       Exercises      General Comments General comments (skin integrity, edema, etc.): BP seated EOB: 133/92, BP standing EOB: 140/109, BP post-ambulation (standing): 156/116. Nurse notified of BP changes. Pt reporting dizziness and light-headedness. Pt's wife in room during session.       Pertinent Vitals/Pain Pain Assessment: 0-10 Pain Score: 8  Pain Location: ankle Pain Descriptors / Indicators: Guarding Pain Intervention(s): Monitored during session;Limited activity within patient's tolerance    Home Living                      Prior Function            PT Goals (current goals can now be found in the care plan section) Acute Rehab PT Goals Patient Stated Goal: get home PT Goal Formulation: With patient/family Time For Goal Achievement: 06/14/17 Potential to Achieve Goals: Good Additional Goals Additional Goal #1: DGI of >20/24 to correlate with low risk to fall. Progress towards PT goals: Progressing toward goals    Frequency    Min 3X/week      PT Plan Current plan remains appropriate    Co-evaluation              AM-PAC PT "6 Clicks" Daily Activity  Outcome Measure  Difficulty turning over in bed (including adjusting bedclothes, sheets and blankets)?: A Little Difficulty moving from lying on back to sitting on the side of the bed? : A Little Difficulty sitting down on and standing up from a chair with  arms (e.g., wheelchair, bedside commode, etc,.)?: A Little Help needed moving to and from a bed to chair (including a wheelchair)?: A Little Help needed walking in hospital room?: A Little Help needed climbing 3-5 steps with a railing? : A Little 6 Click Score: 18    End of Session Equipment Utilized During Treatment: Gait belt Activity Tolerance: Patient tolerated treatment well Patient left: in bed;with call bell/phone within reach;with bed alarm set Nurse Communication: Mobility status;Other (comment)(high BP) PT Visit Diagnosis: Unsteadiness on feet (R26.81);Muscle weakness (generalized) (M62.81)     Time: [id number] PT Time Calculation (min) (ACUTE ONLY): 31 min  Charges:  $Gait Training: 8-22 mins $Therapeutic Activity: 8-22 mins                    G Codes:       Reina Fuserin Deric Bocock, SPT   Reina Fuserin Eisa Conaway 05/31/2017, 2:58 PM

## 2017-05-31 NOTE — Progress Notes (Addendum)
PROGRESS NOTE  Garrett CodeJerome Yuhasz ZOX:[id number] DOB: 03/15/1961 DOA: 05/29/2017 PCP: Jackie Plumsei-Bonsu, George, MD  HPI/Recap of past 24 hours: HPI from Dr Roberto ScalesNettey Shayla MD on 05/29/17 Garrett Ferrell is a 56 y.o. male with medical history significant for chronic atrial fibrillation on Eliquis, hypertension, traumatic brain injury, schizophrenia/bipolar disorder, depression, anxiety who presents on 05/29/2017 with unwitnessed fall. Wife present at bedside to corroborate history. Patient was in his usual state of health, the day prior to admission he was drinking vodka heavily. Patient states he frequently drinks a bottle every 1-2 days.  While brushing his teeth in the bathroom he turned to walk away and suddenly blacked out.  He cannot remember much more by his history after that.  His wife did not witness his fall, but states she was concerned because his speech sounded slurred when she found him laying on his side on the bathroom floor.  She was worried he may have hit his head after the fall she called EMS.  EMS also noticed a slurred speech and found him to have low blood pressure and symptoms concerning for stroke. In the ED, code stroke was activated, CT head/CT angio head and neck were negative for acute stroke. Neurology consulted. Pt was admitted for further observation and management.  Subjective: Feels okay is a little tired and drowsy  Assessment/Plan:  Syncope -Likely secondary to alcohol intoxication, polypharmacy, also suspicion for micturition syncope -Neurology following, -Remote history of TBI, MRI negative for acute process -No events on telemetry -PT OT consult -Cut down Seroquel and risperidone dosage is due to oversedation -Heart rate slightly higher, restart Cardizem  Remote history of TBI and mild residual left-sided deficits from this -Mostly improved back to baseline, cutting down psychotropic meds  #Atrial fibrillation, chronic Currently rate controlled Continue diltiazem  and Eliquis  #Hyperlipidemia Continue atorvastatin 10 mg  #Bipolar disorder and  Schizophrenia Hx of remote traumatic brain injury and mild left-sided weakness from the -Mostly independent however requires assistance with executive function -Discussed medications with his wife due to somnolence and oversedation and duplication of antipsychotics -We agreed to cut down his daytime dose of risperidone and cut down his Seroquel dose as well -Further titration as outpatient he has an appointment in early January with psychiatry  Alcohol abuse -Counseled, continue thiamine, monitor for withdrawal  DVt proph: on eliquis  Code Status: Full Family Communication: None at bedside. Called and discussed with wife  Disposition Plan: Home once stable   Consultants:  Neurology   Procedures:  NOne  Antimicrobials:  None     Objective: Vitals:   05/30/17 2032 05/30/17 2035 05/31/17 0435 05/31/17 1418  BP: (!) 162/106 (!) 154/105 (!) 149/96 (!) 154/99  Pulse: 95 76 83 71  Resp:   17 17  Temp:   98.3 F (36.8 C) 98.3 F (36.8 C)  TempSrc:   Oral Axillary  SpO2: 98%  100% 99%  Weight:      Height:        Intake/Output Summary (Last 24 hours) at 05/31/2017 1428 Last data filed at 05/31/2017 0900 Gross per 24 hour  Intake 240 ml  Output 500 ml  Net -260 ml   Filed Weights   05/29/17 1215  Weight: 115 kg (253 lb 8.5 oz)    Exam:  Gen: Awake, slow to respond, oriented to self and place only HEENT: PERRLA, Neck supple, no JVD Lungs: Good air movement bilaterally, CTAB CVS: RRR,No Gallops,Rubs or new Murmurs Abd: soft, Non tender, non distended, BS present  Extremities: No Cyanosis, Clubbing or edema Skin: no new rashes Neuro no focal deficits noted at this time   Data Reviewed: CBC: Recent Labs  Lab 05/29/17 1150 05/29/17 1158 05/30/17 0433 05/31/17 0645  WBC 4.5  --  4.4 3.6*  NEUTROABS 2.7  --   --  2.0  HGB 11.4* 11.9* 10.3* 10.9*  HCT 34.7* 35.0* 32.0*  34.0*  MCV 70.7*  --  70.8* 72.0*  PLT 166  --  141* 150   Basic Metabolic Panel: Recent Labs  Lab 05/29/17 1150 05/29/17 1158 05/30/17 0433 05/31/17 0645  NA 142 143 143 141  K 3.9 3.8 3.3* 3.5  CL 108 109 112* 109  CO2 17*  --  24 26  GLUCOSE 119* 112* 92 106*  BUN 11 11 8 6   CREATININE 1.45* 1.50* 1.08 1.10  CALCIUM 8.7*  --  7.8* 8.3*   GFR: Estimated Creatinine Clearance: 99.4 mL/min (by C-G formula based on SCr of 1.1 mg/dL). Liver Function Tests: Recent Labs  Lab 05/29/17 1150  AST 28  ALT 18  ALKPHOS 49  BILITOT 0.6  PROT 6.5  ALBUMIN 3.1*   No results for input(s): LIPASE, AMYLASE in the last 168 hours. No results for input(s): AMMONIA in the last 168 hours. Coagulation Profile: Recent Labs  Lab 05/29/17 1150  INR 1.47   Cardiac Enzymes: No results for input(s): CKTOTAL, CKMB, CKMBINDEX, TROPONINI in the last 168 hours. BNP (last 3 results) No results for input(s): PROBNP in the last 8760 hours. HbA1C: No results for input(s): HGBA1C in the last 72 hours. CBG: Recent Labs  Lab 05/29/17 1148  GLUCAP 115*   Lipid Profile: No results for input(s): CHOL, HDL, LDLCALC, TRIG, CHOLHDL, LDLDIRECT in the last 72 hours. Thyroid Function Tests: No results for input(s): TSH, T4TOTAL, FREET4, T3FREE, THYROIDAB in the last 72 hours. Anemia Panel: No results for input(s): VITAMINB12, FOLATE, FERRITIN, TIBC, IRON, RETICCTPCT in the last 72 hours. Urine analysis:    Component Value Date/Time   COLORURINE YELLOW 05/29/2017 1559   APPEARANCEUR CLEAR 05/29/2017 1559   LABSPEC 1.025 05/29/2017 1559   PHURINE 5.0 05/29/2017 1559   GLUCOSEU NEGATIVE 05/29/2017 1559   HGBUR NEGATIVE 05/29/2017 1559   BILIRUBINUR NEGATIVE 05/29/2017 1559   KETONESUR NEGATIVE 05/29/2017 1559   PROTEINUR 30 (A) 05/29/2017 1559   NITRITE NEGATIVE 05/29/2017 1559   LEUKOCYTESUR NEGATIVE 05/29/2017 1559   Sepsis Labs: @LABRCNTIP (procalcitonin:4,lacticidven:4)  )No results found  for this or any previous visit (from the past 240 hour(s)).    Studies: Mr Brain Wo Contrast  Result Date: 05/30/2017 CLINICAL DATA:  Follow-up stroke. History of headache, hypertension, hyperlipidemia. EXAM: MRI HEAD WITHOUT CONTRAST TECHNIQUE: Multiplanar, multiecho pulse sequences of the brain and surrounding structures were obtained without intravenous contrast. COMPARISON:  CT HEAD May 29, 2017 FINDINGS: Moderately motion degraded coronal T2 sequence. BRAIN: No reduced diffusion to suggest acute ischemia. No susceptibility artifact to suggest hemorrhage. The ventricles and sulci are normal for patient's age. Scattered subcentimeter supratentorial white matter FLAIR T2 hyperintensities. No suspicious parenchymal signal, mass or mass effect. No abnormal extra-axial fluid collections. VASCULAR: Normal major intracranial vascular flow voids present at skull base. SKULL AND UPPER CERVICAL SPINE: No abnormal sellar expansion. No suspicious calvarial bone marrow signal. Craniocervical junction maintained. SINUSES/ORBITS: Mild bilateral maxillary sinus mucosal thickening. Mastoid air cells are well aerated. The included ocular globes and orbital contents are non-suspicious. OTHER: None. IMPRESSION: 1. No acute intracranial process. 2. Mild white matter changes most compatible with chronic small  vessel ischemic disease. Electronically Signed   By: Awilda Metro M.D.   On: 05/30/2017 18:14    Scheduled Meds: . allopurinol  100 mg Oral Daily  . apixaban  5 mg Oral BID  . atorvastatin  10 mg Oral QPM  . diltiazem  240 mg Oral Daily  . QUEtiapine Fumarate  150 mg Oral QHS  . QUEtiapine  50 mg Oral QHS  . [START ON 06/01/2017] risperiDONE  1 mg Oral Daily  . risperiDONE  2 mg Oral QHS  . trazodone  300 mg Oral QHS    Continuous Infusions:   LOS: 1 day     Zannie Cove, MD [facility]   If 7PM-7AM, please contact night-coverage [email or web address] Password [email or web address] 05/31/2017, 2:28  PM

## 2017-06-01 MED ORDER — ZOLPIDEM TARTRATE 10 MG PO TABS: 10.0000 mg | ORAL_TABLET | Freq: Every evening | ORAL | 0 refills | Status: DC | PRN

## 2017-06-01 MED ORDER — RISPERIDONE 1 MG PO TABS: 1.0000 mg | ORAL_TABLET | Freq: Two times a day (BID) | ORAL | Status: AC

## 2017-06-01 MED ORDER — QUETIAPINE FUMARATE 50 MG PO TABS: 50.0000 mg | ORAL_TABLET | Freq: Every day | ORAL | 0 refills | Status: DC

## 2017-06-01 NOTE — Care Management Note (Signed)
Case Management Note  Patient Details  Name: Garrett Ferrell MRN: [id number] Date of Birth: 03/05/1961  Subjective/Objective:                 Patient with order to DC to home today. Chart reviewed. No Home Health or Equipment needs, no unacknowledged Case Management consults or medication needs identified at the time of this note. Plan for DC to home.  CM signing off. If new needs arise today prior to discharge, please call Lawerance SabalDebbie Tequlia Gonsalves RN CM at [phone].   Action/Plan:   Expected Discharge Date:  06/01/17               Expected Discharge Plan:  Home/Self Care  In-House Referral:     Discharge planning Services  CM Consult  Post Acute Care Choice:    Choice offered to:     DME Arranged:    DME Agency:     HH Arranged:    HH Agency:     Status of Service:  Completed, signed off  If discussed at [employer] Length of Stay Meetings, dates discussed:    Additional Comments:  Lawerance SabalDebbie Tanecia Mccay, RN 06/01/2017, 10:10 AM

## 2017-06-01 NOTE — Discharge Instructions (Signed)

## 2017-06-12 NOTE — Discharge Summary (Signed)
Physician Discharge Summary  Garrett Ferrell ZOX:[id number] DOB: [birth year] DOA: 05/29/2017  PCP: Jackie Plum, MD  Admit date: 05/29/2017 Discharge date: 06/01/2018  Time spent: 45 minutes  Recommendations for Outpatient Follow-up:  1. PCP in 1 week 2. Psychiatrist in 2weeks -psychotropic medication management   Discharge Diagnoses:  Principal Problem:   Syncope Active Problems:   Atrial fibrillation, chronic (HCC)   Hyperlipidemia   Bipolar disorder (HCC)   Schizophrenia (HCC)   TBI (traumatic brain injury) (HCC)   Unwitnessed fall   Alcohol abuse with intoxication (HCC)   Slurred speech   Discharge Condition: stable  Diet recommendation: low sodium  Filed Weights   05/29/17 1215  Weight: 115 kg (253 lb 8.5 oz)    History of present illness:  Garrett Ferrell a 57 y.o.malewith medical history significant for chronic atrial fibrillation on Eliquis, hypertension, traumatic brain injury, schizophrenia/bipolar disorder, depression, anxiety who presents on 12/26/2018with unwitnessed fall. Wife present at bedside to corroborate history. Patient was in his usual state of health, the day prior to admission he was drinking vodka heavily. Patient states he frequently drinks a bottle every 1-2 days. While brushing his teeth in the bathroom he turned to walk away and suddenly blacked out    Hospital Course:   Syncope -Likely secondary to alcohol intoxication/dehydration/polypharmacy, also suspicion for micturition syncope -Neurology consulted, workup unremarkable, -Remote history of TBI, MRI negative for acute process -No events noted on telemetry, orthostatics negative -Cut down Seroquel and risperidone dosage due to oversedation -ETOH cessation counseled -stable now  Remote history of TBI and mild residual left-sided deficits from this -improved back to baseline, cutting down psychotropic meds -see below  #Atrial fibrillation, chronic Currently rate  controlled Continue diltiazem and Eliquis  #Hyperlipidemia Continue atorvastatin 10 mg  #Bipolar disorder and Schizophrenia Hx of remote traumatic brain injury and mild left-sided weakness from this -Mostly independent however requires assistance with executive function -Discussed medications with his wife due to somnolence and oversedation and duplication of antipsychotics -We agreed to cut down his daytime dose of risperidone and cut down his Seroquel dose as well -Further titration as outpatient he has an appointment in early January with psychiatry  Alcohol abuse -Counseled, continue thiamine, no withdrawal noted    Consultations:  Neurology  Discharge Exam: Vitals:   05/31/17 2138 06/01/17 0518  BP: (!) 130/99 119/87  Pulse: 79 76  Resp: 18 17  Temp: (!) 97.5 F (36.4 C) (!) 97.5 F (36.4 C)  SpO2: 99% 97%    General: AAOx3 Cardiovascular: S1S2/RRR Respiratory: CTAB  Discharge Instructions   Discharge Instructions    Diet - low sodium heart healthy   Complete by:  As directed    Increase activity slowly   Complete by:  As directed      Allergies as of 06/01/2017   No Known Allergies     Medication List    STOP taking these medications   meloxicam 15 MG tablet Commonly known as:  MOBIC   SEROQUEL XR 150 MG 24 hr tablet Generic drug:  QUEtiapine Fumarate Replaced by:  QUEtiapine 50 MG tablet   tiZANidine 2 MG tablet Commonly known as:  ZANAFLEX     TAKE these medications   allopurinol 100 MG tablet Commonly known as:  ZYLOPRIM Take 100 mg by mouth daily.   ALPRAZolam 0.5 MG tablet Commonly known as:  XANAX Take 0.5 mg by mouth every 8 (eight) hours as needed for anxiety.   atorvastatin 10 MG tablet Commonly known as:  LIPITOR  Take 10 mg by mouth every evening.   diltiazem 240 MG 24 hr capsule Commonly known as:  CARDIZEM CD Take 1 capsule (240 mg total) by mouth daily.   ELIQUIS 5 MG Tabs tablet Generic drug:  apixaban Take 1  tablet (5 mg total) by mouth 2 (two) times daily.   ferrous sulfate 325 (65 FE) MG EC tablet Take 325 mg by mouth daily with breakfast.   losartan 25 MG tablet Commonly known as:  COZAAR Take 25 mg by mouth daily.   oxyCODONE-acetaminophen 5-325 MG tablet Commonly known as:  ROXICET Take 1 tablet by mouth every 8 (eight) hours as needed for severe pain.   QUEtiapine 50 MG tablet Commonly known as:  SEROQUEL Take 1 tablet (50 mg total) by mouth at bedtime. Replaces:  SEROQUEL XR 150 MG 24 hr tablet   risperiDONE 1 MG tablet Commonly known as:  RISPERDAL Take 1-2 tablets (1-2 mg total) by mouth 2 (two) times daily. Take 1tab (1mg ) in morning and 2 tabs (2mg )at night What changed:    how much to take  additional instructions   trazodone 300 MG tablet Commonly known as:  DESYREL Take 300 mg by mouth at bedtime.   VIAGRA 100 MG tablet Generic drug:  sildenafil Take 100 mg by mouth daily as needed for erectile dysfunction.   zolpidem 10 MG tablet Commonly known as:  AMBIEN Take 1 tablet (10 mg total) by mouth at bedtime as needed for sleep. What changed:    when to take this  reasons to take this      No Known Allergies Follow-up Information    Osei-Bonsu, Greggory Stallion, MD. Schedule an appointment as soon as possible for a visit in 1 week(s).   Specialty:  Internal Medicine Contact information: [address] [place] [address] [phone]            The results of significant diagnostics from this hospitalization (including imaging, microbiology, ancillary and laboratory) are listed below for reference.    Significant Diagnostic Studies: Ct Angio Head W Or Wo Contrast  Result Date: 05/29/2017 CLINICAL DATA:  57 year old male code stroke. Left side weakness and slurred speech. EXAM: CT ANGIOGRAPHY HEAD AND NECK TECHNIQUE: Multidetector CT imaging of the head and neck was performed using the standard protocol during bolus administration of  intravenous contrast. Multiplanar CT image reconstructions and MIPs were obtained to evaluate the vascular anatomy. Carotid stenosis measurements (when applicable) are obtained utilizing NASCET criteria, using the distal internal carotid diameter as the denominator. CONTRAST:  ISOVUE-370 IOPAMIDOL (ISOVUE-370) INJECTION 76% COMPARISON:  Head CT without contrast 1202 hours today. FINDINGS: CTA NECK Skeleton: No acute osseous abnormality identified. Visualized paranasal sinuses and mastoids are stable and well pneumatized. Upper chest: Negative visualized upper lungs. Negative visualized superior mediastinum aside from mild lipomatosis. Partially visible subpectoral implants. Other neck: Negative.  No cervical lymphadenopathy. Aortic arch: No aortic arch atherosclerosis or great vessel origin stenosis. Incidental distal location of the left subclavian origin from the arch (series 10, image 136). Right carotid system: Negative brachiocephalic artery and right CCA origin. Negative right CCA. Minimal soft atherosclerotic plaque at the anterior right carotid bifurcation near the ECA origin. Widely patent right ICA in the neck to the skullbase. Left carotid system: Negative left CCA aside from mild tortuosity. Negative left carotid bifurcation and cervical left ICA. Vertebral arteries: Normal proximal right subclavian artery. Normal right vertebral artery origin. Mild obscuration of some of the right V1 segment related to paravertebral  venous contrast. The right vertebral artery appears normal to the skullbase. Normal left CCA origin aside from somewhat distal positioning from the arch. Subsequently there is mild tortuosity of the proximal left subclavian. Normal left vertebral artery origin. Mildly non dominant left vertebral artery is normal to the skullbase. CTA HEAD Posterior circulation: Normal distal vertebral arteries. Patent PICA origins. Mildly fenestrated vertebrobasilar junction (normal variant) without  stenosis. Patent basilar artery without stenosis. Normal SCA and PCA origins. Posterior communicating arteries are diminutive or absent. Bilateral PCA branches are within normal limits. Anterior circulation: Patent ICA siphons. Minimal calcified plaque on the left. No siphon stenosis. Normal ophthalmic artery origins. Patent carotid termini. Normal MCA and ACA origins. Mildly tortuous A1 segments. Diminutive or absent anterior communicating artery. Bilateral ACA branches are within normal limits. Left MCA M1 segment, left MCA bifurcation, and visible left MCA branches are within normal limits. Slightly early arterial enhancement timing at the MCA branches. Right MCA M1 segment, right MCA bifurcation, and visible right MCA branches are within normal limits. Venous sinuses: Early contrast timing, but there is early enhancement of the major dural venous sinuses suggesting patency. Anatomic variants: Mildly dominant right vertebral artery. Fenestrated vertebrobasilar junction. Somewhat distal origin of the left subclavian artery from the aortic arch. Review of the MIP images confirms the above findings IMPRESSION: 1. Negative for emergent large vessel occlusion. 2. Somewhat early contrast timing for the bilateral distal MCA branches. No MCA branch occlusion is identified. 3. Only minimal atherosclerosis identified in the head and neck. No arterial stenosis identified. A preliminary report of the above was communicated to Dr. Pearlean Brownie at 1234 hours on 12/26/2018by text page via the [facility] messaging system. Electronically Signed   By: Odessa Fleming M.D.   On: 05/29/2017 12:42   Ct Angio Neck W Or Wo Contrast  Result Date: 05/29/2017 CLINICAL DATA:  57 year old male code stroke. Left side weakness and slurred speech. EXAM: CT ANGIOGRAPHY HEAD AND NECK TECHNIQUE: Multidetector CT imaging of the head and neck was performed using the standard protocol during bolus administration of intravenous contrast. Multiplanar CT image  reconstructions and MIPs were obtained to evaluate the vascular anatomy. Carotid stenosis measurements (when applicable) are obtained utilizing NASCET criteria, using the distal internal carotid diameter as the denominator. CONTRAST:  ISOVUE-370 IOPAMIDOL (ISOVUE-370) INJECTION 76% COMPARISON:  Head CT without contrast 1202 hours today. FINDINGS: CTA NECK Skeleton: No acute osseous abnormality identified. Visualized paranasal sinuses and mastoids are stable and well pneumatized. Upper chest: Negative visualized upper lungs. Negative visualized superior mediastinum aside from mild lipomatosis. Partially visible subpectoral implants. Other neck: Negative.  No cervical lymphadenopathy. Aortic arch: No aortic arch atherosclerosis or great vessel origin stenosis. Incidental distal location of the left subclavian origin from the arch (series 10, image 136). Right carotid system: Negative brachiocephalic artery and right CCA origin. Negative right CCA. Minimal soft atherosclerotic plaque at the anterior right carotid bifurcation near the ECA origin. Widely patent right ICA in the neck to the skullbase. Left carotid system: Negative left CCA aside from mild tortuosity. Negative left carotid bifurcation and cervical left ICA. Vertebral arteries: Normal proximal right subclavian artery. Normal right vertebral artery origin. Mild obscuration of some of the right V1 segment related to paravertebral venous contrast. The right vertebral artery appears normal to the skullbase. Normal left CCA origin aside from somewhat distal positioning from the arch. Subsequently there is mild tortuosity of the proximal left subclavian. Normal left vertebral artery origin. Mildly non dominant left vertebral artery is normal  to the skullbase. CTA HEAD Posterior circulation: Normal distal vertebral arteries. Patent PICA origins. Mildly fenestrated vertebrobasilar junction (normal variant) without stenosis. Patent basilar artery without  stenosis. Normal SCA and PCA origins. Posterior communicating arteries are diminutive or absent. Bilateral PCA branches are within normal limits. Anterior circulation: Patent ICA siphons. Minimal calcified plaque on the left. No siphon stenosis. Normal ophthalmic artery origins. Patent carotid termini. Normal MCA and ACA origins. Mildly tortuous A1 segments. Diminutive or absent anterior communicating artery. Bilateral ACA branches are within normal limits. Left MCA M1 segment, left MCA bifurcation, and visible left MCA branches are within normal limits. Slightly early arterial enhancement timing at the MCA branches. Right MCA M1 segment, right MCA bifurcation, and visible right MCA branches are within normal limits. Venous sinuses: Early contrast timing, but there is early enhancement of the major dural venous sinuses suggesting patency. Anatomic variants: Mildly dominant right vertebral artery. Fenestrated vertebrobasilar junction. Somewhat distal origin of the left subclavian artery from the aortic arch. Review of the MIP images confirms the above findings IMPRESSION: 1. Negative for emergent large vessel occlusion. 2. Somewhat early contrast timing for the bilateral distal MCA branches. No MCA branch occlusion is identified. 3. Only minimal atherosclerosis identified in the head and neck. No arterial stenosis identified. A preliminary report of the above was communicated to Dr. Pearlean Brownie at 1234 hours on 12/26/2018by text page via the [facility] messaging system. Electronically Signed   By: Odessa Fleming M.D.   On: 05/29/2017 12:42   Mr Brain Wo Contrast  Result Date: 05/30/2017 CLINICAL DATA:  Follow-up stroke. History of headache, hypertension, hyperlipidemia. EXAM: MRI HEAD WITHOUT CONTRAST TECHNIQUE: Multiplanar, multiecho pulse sequences of the brain and surrounding structures were obtained without intravenous contrast. COMPARISON:  CT HEAD May 29, 2017 FINDINGS: Moderately motion degraded coronal T2 sequence.  BRAIN: No reduced diffusion to suggest acute ischemia. No susceptibility artifact to suggest hemorrhage. The ventricles and sulci are normal for patient's age. Scattered subcentimeter supratentorial white matter FLAIR T2 hyperintensities. No suspicious parenchymal signal, mass or mass effect. No abnormal extra-axial fluid collections. VASCULAR: Normal major intracranial vascular flow voids present at skull base. SKULL AND UPPER CERVICAL SPINE: No abnormal sellar expansion. No suspicious calvarial bone marrow signal. Craniocervical junction maintained. SINUSES/ORBITS: Mild bilateral maxillary sinus mucosal thickening. Mastoid air cells are well aerated. The included ocular globes and orbital contents are non-suspicious. OTHER: None. IMPRESSION: 1. No acute intracranial process. 2. Mild white matter changes most compatible with chronic small vessel ischemic disease. Electronically Signed   By: Awilda Metro M.D.   On: 05/30/2017 18:14   Ct Head Code Stroke Wo Contrast  Result Date: 05/29/2017 CLINICAL DATA:  Code stroke. Left-sided weakness and slurred speech. EXAM: CT HEAD WITHOUT CONTRAST TECHNIQUE: Contiguous axial images were obtained from the base of the skull through the vertex without intravenous contrast. COMPARISON:  None. FINDINGS: Brain: There is no evidence of acute infarct, intracranial hemorrhage, mass, midline shift, or extra-axial fluid collection. The ventricles and sulci are normal. Vascular: Mild calcified atherosclerosis in the carotid siphons. No hyperdense vessel. Skull: No fracture or focal osseous lesion. Sinuses/Orbits: The visualized paranasal sinuses and mastoid air cells are clear. Cerumen is noted in the external auditory canals. The orbits are unremarkable. Other: None. ASPECTS [facility] Stroke Program Early CT Score) - Ganglionic level infarction (caudate, lentiform nuclei, internal capsule, insula, M1-M3 cortex): 7 - Supraganglionic infarction (M4-M6 cortex): 3 Total score (0-10  with 10 being normal): 10 IMPRESSION: 1. No evidence of acute intracranial abnormality.  Unremarkable CT appearance of the brain. 2. ASPECTS is 10. These results were communicated to Dr. Pearlean BrownieSethi at 12:16 pmon 05/29/2017 by text page via the [facility] messaging system. Electronically Signed   By: Sebastian AcheAllen  Grady M.D.   On: 05/29/2017 12:16    Microbiology: No results found for this or any previous visit (from the past 240 hour(s)).   Labs: Basic Metabolic Panel: No results for input(s): NA, K, CL, CO2, GLUCOSE, BUN, CREATININE, CALCIUM, MG, PHOS in the last 168 hours. Liver Function Tests: No results for input(s): AST, ALT, ALKPHOS, BILITOT, PROT, ALBUMIN in the last 168 hours. No results for input(s): LIPASE, AMYLASE in the last 168 hours. No results for input(s): AMMONIA in the last 168 hours. CBC: No results for input(s): WBC, NEUTROABS, HGB, HCT, MCV, PLT in the last 168 hours. Cardiac Enzymes: No results for input(s): CKTOTAL, CKMB, CKMBINDEX, TROPONINI in the last 168 hours. BNP: BNP (last 3 results) No results for input(s): BNP in the last 8760 hours.  ProBNP (last 3 results) No results for input(s): PROBNP in the last 8760 hours.  CBG: No results for input(s): GLUCAP in the last 168 hours.     Signed:  Zannie CovePreetha Britian Jentz MD.  [facility] 06/12/2017, 3:24 PM

## 2017-06-19 ENCOUNTER — Ambulatory Visit (INDEPENDENT_AMBULATORY_CARE_PROVIDER_SITE_OTHER): Payer: Medicaid Other | Admitting: Orthopedic Surgery

## 2017-06-20 ENCOUNTER — Ambulatory Visit (INDEPENDENT_AMBULATORY_CARE_PROVIDER_SITE_OTHER): Payer: Medicaid Other | Admitting: Orthopedic Surgery

## 2017-06-27 ENCOUNTER — Ambulatory Visit (INDEPENDENT_AMBULATORY_CARE_PROVIDER_SITE_OTHER): Payer: Medicaid Other | Admitting: Orthopedic Surgery

## 2017-06-27 ENCOUNTER — Encounter (INDEPENDENT_AMBULATORY_CARE_PROVIDER_SITE_OTHER): Payer: Self-pay | Admitting: Orthopedic Surgery

## 2017-06-27 VITALS — Ht 72.0 in | Wt 253.0 lb

## 2017-06-27 DIAGNOSIS — M12571 Traumatic arthropathy, right ankle and foot: Secondary | ICD-10-CM

## 2017-06-27 NOTE — Progress Notes (Signed)
Office Visit Note   Patient: Garrett Ferrell           Date of Birth: 06/12/1960           MRN: [id number] Visit Date: 06/27/2017              Requested by: Jackie Plumsei-Bonsu, George, MD [id number] [place], [place] [id number] PCP: Jackie Plumsei-Bonsu, George, MD  Chief Complaint  Patient presents with  . Right Ankle - Follow-up      HPI: Patient presents for follow-up status post right ankle arthroscopic fusion he is regular shoewear full weightbearing has no complaints.  Assessment & Plan: Visit Diagnoses:  1. Traumatic arthritis of right ankle     Plan: Patient will continue with activities as tolerated follow-up as needed.  Follow-Up Instructions: Return if symptoms worsen or fail to improve.   Ortho Exam  Patient is alert, oriented, no adenopathy, well-dressed, normal affect, normal respiratory effort. Examination patient has very little swelling of the foot and ankle.  There is no pain with range of motion of the forefoot.  There is no swelling no cellulitis.  Imaging: No results found. No images are attached to the encounter.  Labs: No results found for: HGBA1C, ESRSEDRATE, CRP, LABURIC, REPTSTATUS, GRAMSTAIN, CULT, LABORGA  @LABSALLVALUES (HGBA1)@  Body mass index is 34.31 kg/m.  Orders:  No orders of the defined types were placed in this encounter.  No orders of the defined types were placed in this encounter.    Procedures: No procedures performed  Clinical Data: No additional findings.  ROS:  All other systems negative, except as noted in the HPI. Review of Systems  Objective: Vital Signs: Ht 6' (1.829 m)   Wt 253 lb (114.8 kg)   BMI 34.31 kg/m   Specialty Comments:  No specialty comments available.  PMFS History: Patient Active Problem List   Diagnosis Date Noted  . Syncope 05/29/2017  . Bipolar disorder (HCC) 05/29/2017  . Schizophrenia (HCC) 05/29/2017  . TBI (traumatic brain injury) (HCC) 05/29/2017  . Unwitnessed fall 05/29/2017   . Alcohol abuse with intoxication (HCC) 05/29/2017  . Slurred speech 05/29/2017  . Chest pain 08/07/2016  . Cocaine use 08/07/2016  . Chronic musculoskeletal pain 08/07/2016  . CKD (chronic kidney disease) 08/07/2016  . Depression with anxiety 08/07/2016  . Traumatic arthritis of right ankle 07/31/2016  . 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 Osteoarthritis of right subtalar joint   . Right ankle pain 01/24/2016  . Bilateral hip pain 01/03/2016  . Atrial fibrillation, chronic (HCC) 11/18/2015  . Essential hypertension 11/18/2015  . Hyperlipidemia 11/18/2015  . DVT, bilateral lower limbs (HCC) 11/18/2015  . Pulmonary embolus (HCC) 11/18/2015   Past Medical History:  Diagnosis Date  . Anemia    takes Iron pill daily  . Anxiety    takes Xanax daily as needed  . Arthritis   . Atrial fibrillation (HCC)    takes Eliquis daily. Last dose will be 10/03/16  . Depression    takes Risperdal daily  . DVT (deep venous thrombosis) (HCC)    both legs in 2016  . Dyspnea    when laying on back  . Gout    takes Allopurinol daily  . Headache    occasionally  . History of shingles 2010  . Hyperlipidemia    takes Atorvastatin daily  . Hyperlipidemia    takes Atorvastatin daily  . Hypertension    takes Metoprolol daily  . Insomnia    takes Ambien and  Trazodone nightly  . Joint pain   . Joint swelling    right ankle  . 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 Nocturia   . Seizures (HCC)    last one in 2002    Family History  Problem Relation Age of Onset  . Heart Problems Mother   . Heart attack Father        age 17s    Past Surgical History:  Procedure Laterality Date  . ANKLE ARTHROSCOPY Right 04/20/2016   Procedure: ANKLE ARTHROSCOPY;  Surgeon: Nadara Mustard, MD;  Location: [facility] OR;  Service: Orthopedics;  Laterality: Right;  . ANKLE ARTHROSCOPY WITH FUSION Right 10/05/2016   Procedure: RIGHT ANKLE ARTHROSCOPIC FUSION;  Surgeon: Nadara Mustard, MD;  Location: [facility] OR;   Service: Orthopedics;  Laterality: Right;  . ANKLE FUSION Right 04/20/2016   Procedure: Right Posterior Arthroscopic Subtalar Arthrodesis;  Surgeon: Nadara Mustard, MD;  Location: [facility] OR;  Service: Orthopedics;  Laterality: Right;  . ANKLE SURGERY Right    plates/screws  . COLONOSCOPY WITH PROPOFOL N/A 11/27/2016   Procedure: COLONOSCOPY WITH PROPOFOL;  Surgeon: Charolett Bumpers, MD;  Location: [facility] ENDOSCOPY;  Service: Endoscopy;  Laterality: N/A;  . HERNIA REPAIR     1986-umbilical  . RIGHT HEART CATH  2016   at [facility] during his DVT/PE   Social History   Occupational History  . Occupation: disabled  Tobacco Use  . Smoking status: Never Smoker  . Smokeless tobacco: Never Used  Substance and Sexual Activity  . Alcohol use: Yes    Alcohol/week: 0.0 oz    Comment: one month ago- 09/2016  . Drug use: No  . Sexual activity: Not on file

## 2017-07-18 ENCOUNTER — Ambulatory Visit (INDEPENDENT_AMBULATORY_CARE_PROVIDER_SITE_OTHER): Payer: Medicaid Other | Admitting: Orthopedic Surgery

## 2017-07-18 ENCOUNTER — Ambulatory Visit (INDEPENDENT_AMBULATORY_CARE_PROVIDER_SITE_OTHER): Payer: Medicaid Other

## 2017-07-18 ENCOUNTER — Encounter (INDEPENDENT_AMBULATORY_CARE_PROVIDER_SITE_OTHER): Payer: Self-pay | Admitting: Orthopedic Surgery

## 2017-07-18 VITALS — Ht 72.0 in | Wt 253.0 lb

## 2017-07-18 DIAGNOSIS — M25571 Pain in right ankle and joints of right foot: Secondary | ICD-10-CM

## 2017-07-18 MED ORDER — OXYCODONE-ACETAMINOPHEN 5-325 MG PO TABS: 1.0000 | ORAL_TABLET | Freq: Three times a day (TID) | ORAL | 0 refills | Status: DC | PRN

## 2017-07-18 NOTE — Progress Notes (Signed)
Office Visit Note   Patient: Garrett Ferrell           Date of Birth: 01/07/1961           MRN: [id number] Visit Date: 07/18/2017              Requested by: Jackie Plumsei-Bonsu, George, MD [id number] [place], [place] [id number] PCP: Jackie Plumsei-Bonsu, George, MD  Chief Complaint  Patient presents with  . Right Ankle - Follow-up    10/05/16 right ankle PASTA      HPI: Patient is a 57 year old gentleman who is status post right ankle and subtalar fusion approximately 8 months out from surgery.  Patient states that he was in the wrestling wearing working out showing younger athletes however wrestle when he stomped on his foot hard and had increased pain over the talonavicular joint region.  Assessment & Plan: Visit Diagnoses:  1. Pain in right ankle and joints of right foot     Plan: Recommended he get back in a fracture boot wear this for a month follow-up in 4 weeks with repeat three-view radiographs of the right ankle.  Follow-Up Instructions: Return in about 4 weeks (around 08/15/2017).   Ortho Exam  Patient is alert, oriented, no adenopathy, well-dressed, normal affect, normal respiratory effort. Examination patient has a good pulse his foot is plantigrade ankle at 90 degrees.  He has tenderness over the talonavicular region.  There is no pain with attempted subtalar motion or ankle motion.  There is no redness no cellulitis no swelling no open wounds.  Imaging: Xr Ankle Complete Right  Result Date: 07/18/2017 3 view radiographs of the right ankle shows stable subtalar and tibiotalar fusion.  There is some mild fibrous union of the ankle joint.  Midfoot has no degenerative changes.  No hardware failure.  No lucency around the hardware.  No images are attached to the encounter.  Labs: No results found for: HGBA1C, ESRSEDRATE, CRP, LABURIC, REPTSTATUS, GRAMSTAIN, CULT, LABORGA  @LABSALLVALUES (HGBA1)@  Body mass index is 34.31 kg/m.  Orders:  Orders Placed This Encounter    Procedures  . XR Ankle Complete Right   Meds ordered this encounter  Medications  . oxyCODONE-acetaminophen (PERCOCET/ROXICET) 5-325 MG tablet    Sig: Take 1 tablet by mouth every 8 (eight) hours as needed for severe pain.    Dispense:  20 tablet    Refill:  0     Procedures: No procedures performed  Clinical Data: No additional findings.  ROS:  All other systems negative, except as noted in the HPI. Review of Systems  Objective: Vital Signs: Ht 6' (1.829 m)   Wt 253 lb (114.8 kg)   BMI 34.31 kg/m   Specialty Comments:  No specialty comments available.  PMFS History: Patient Active Problem List   Diagnosis Date Noted  . Syncope 05/29/2017  . Bipolar disorder (HCC) 05/29/2017  . Schizophrenia (HCC) 05/29/2017  . TBI (traumatic brain injury) (HCC) 05/29/2017  . Unwitnessed fall 05/29/2017  . Alcohol abuse with intoxication (HCC) 05/29/2017  . Slurred speech 05/29/2017  . Chest pain 08/07/2016  . Cocaine use 08/07/2016  . Chronic musculoskeletal pain 08/07/2016  . CKD (chronic kidney disease) 08/07/2016  . Depression with anxiety 08/07/2016  . Traumatic arthritis of right ankle 07/31/2016  . 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 Osteoarthritis of right subtalar joint   . Right ankle pain 01/24/2016  . Bilateral hip pain 01/03/2016  . Atrial fibrillation, chronic (HCC) 11/18/2015  . Essential hypertension 11/18/2015  .  Hyperlipidemia 11/18/2015  . DVT, bilateral lower limbs (HCC) 11/18/2015  . Pulmonary embolus (HCC) 11/18/2015   Past Medical History:  Diagnosis Date  . Anemia    takes Iron pill daily  . Anxiety    takes Xanax daily as needed  . Arthritis   . Atrial fibrillation (HCC)    takes Eliquis daily. Last dose will be 10/03/16  . Depression    takes Risperdal daily  . DVT (deep venous thrombosis) (HCC)    both legs in 2016  . Dyspnea    when laying on back  . Gout    takes Allopurinol daily  . Headache    occasionally  .  History of shingles 2010  . Hyperlipidemia    takes Atorvastatin daily  . Hyperlipidemia    takes Atorvastatin daily  . Hypertension    takes Metoprolol daily  . Insomnia    takes Ambien and Trazodone nightly  . Joint pain   . Joint swelling    right ankle  . 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 Nocturia   . Seizures (HCC)    last one in 2002    Family History  Problem Relation Age of Onset  . Heart Problems Mother   . Heart attack Father        age 69s    Past Surgical History:  Procedure Laterality Date  . ANKLE ARTHROSCOPY Right 04/20/2016   Procedure: ANKLE ARTHROSCOPY;  Surgeon: Nadara Mustard, MD;  Location: [facility] OR;  Service: Orthopedics;  Laterality: Right;  . ANKLE ARTHROSCOPY WITH FUSION Right 10/05/2016   Procedure: RIGHT ANKLE ARTHROSCOPIC FUSION;  Surgeon: Nadara Mustard, MD;  Location: [facility] OR;  Service: Orthopedics;  Laterality: Right;  . ANKLE FUSION Right 04/20/2016   Procedure: Right Posterior Arthroscopic Subtalar Arthrodesis;  Surgeon: Nadara Mustard, MD;  Location: [facility] OR;  Service: Orthopedics;  Laterality: Right;  . ANKLE SURGERY Right    plates/screws  . COLONOSCOPY WITH PROPOFOL N/A 11/27/2016   Procedure: COLONOSCOPY WITH PROPOFOL;  Surgeon: Charolett Bumpers, MD;  Location: [facility]COPY;  Service: Endoscopy;  Laterality: N/A;  . HERNIA REPAIR     1986-umbilical  . RIGHT HEART CATH  2016   at [facility] during his DVT/PE   Social History   Occupational History  . Occupation: disabled  Tobacco Use  . Smoking status: Never Smoker  . Smokeless tobacco: Never Used  Substance and Sexual Activity  . Alcohol use: Yes    Alcohol/week: 0.0 oz    Comment: one month ago- 09/2016  . Drug use: No  . Sexual activity: Not on file

## 2017-08-15 ENCOUNTER — Encounter (HOSPITAL_COMMUNITY): Payer: Self-pay | Admitting: Emergency Medicine

## 2017-08-15 DIAGNOSIS — F209 Schizophrenia, unspecified: Secondary | ICD-10-CM | POA: Diagnosis present

## 2017-08-15 DIAGNOSIS — F1020 Alcohol dependence, uncomplicated: Secondary | ICD-10-CM | POA: Diagnosis present

## 2017-08-15 DIAGNOSIS — Z79899 Other long term (current) drug therapy: Secondary | ICD-10-CM

## 2017-08-15 DIAGNOSIS — R001 Bradycardia, unspecified: Secondary | ICD-10-CM | POA: Diagnosis not present

## 2017-08-15 DIAGNOSIS — I10 Essential (primary) hypertension: Secondary | ICD-10-CM | POA: Diagnosis present

## 2017-08-15 DIAGNOSIS — D649 Anemia, unspecified: Secondary | ICD-10-CM | POA: Diagnosis present

## 2017-08-15 DIAGNOSIS — F319 Bipolar disorder, unspecified: Secondary | ICD-10-CM | POA: Diagnosis present

## 2017-08-15 DIAGNOSIS — I959 Hypotension, unspecified: Secondary | ICD-10-CM | POA: Diagnosis not present

## 2017-08-15 DIAGNOSIS — I482 Chronic atrial fibrillation: Secondary | ICD-10-CM | POA: Diagnosis present

## 2017-08-15 DIAGNOSIS — M109 Gout, unspecified: Secondary | ICD-10-CM | POA: Diagnosis present

## 2017-08-15 DIAGNOSIS — E785 Hyperlipidemia, unspecified: Secondary | ICD-10-CM | POA: Diagnosis present

## 2017-08-15 DIAGNOSIS — Z87820 Personal history of traumatic brain injury: Secondary | ICD-10-CM

## 2017-08-15 DIAGNOSIS — G8929 Other chronic pain: Secondary | ICD-10-CM | POA: Diagnosis present

## 2017-08-15 DIAGNOSIS — Z791 Long term (current) use of non-steroidal anti-inflammatories (NSAID): Secondary | ICD-10-CM

## 2017-08-15 DIAGNOSIS — M1612 Unilateral primary osteoarthritis, left hip: Secondary | ICD-10-CM | POA: Diagnosis present

## 2017-08-15 DIAGNOSIS — G4700 Insomnia, unspecified: Secondary | ICD-10-CM | POA: Diagnosis present

## 2017-08-15 DIAGNOSIS — M6282 Rhabdomyolysis: Principal | ICD-10-CM | POA: Diagnosis present

## 2017-08-15 DIAGNOSIS — F1490 Cocaine use, unspecified, uncomplicated: Secondary | ICD-10-CM | POA: Diagnosis present

## 2017-08-15 DIAGNOSIS — R740 Nonspecific elevation of levels of transaminase and lactic acid dehydrogenase [LDH]: Secondary | ICD-10-CM | POA: Diagnosis present

## 2017-08-15 DIAGNOSIS — Z7901 Long term (current) use of anticoagulants: Secondary | ICD-10-CM

## 2017-08-15 DIAGNOSIS — F418 Other specified anxiety disorders: Secondary | ICD-10-CM | POA: Diagnosis present

## 2017-08-15 DIAGNOSIS — G8194 Hemiplegia, unspecified affecting left nondominant side: Secondary | ICD-10-CM | POA: Diagnosis present

## 2017-08-15 LAB — URINALYSIS, ROUTINE W REFLEX MICROSCOPIC
Bilirubin Urine: NEGATIVE
GLUCOSE, UA: NEGATIVE mg/dL
Ketones, ur: 20 mg/dL — AB
Leukocytes, UA: NEGATIVE
NITRITE: NEGATIVE
PH: 5 (ref 5.0–8.0)
Protein, ur: 100 mg/dL — AB
SPECIFIC GRAVITY, URINE: 1.02 (ref 1.005–1.030)

## 2017-08-15 LAB — COMPREHENSIVE METABOLIC PANEL
ALBUMIN: 4 g/dL (ref 3.5–5.0)
ALK PHOS: 46 U/L (ref 38–126)
ALT: 141 U/L — AB (ref 17–63)
ANION GAP: 14 (ref 5–15)
AST: 803 U/L — ABNORMAL HIGH (ref 15–41)
BILIRUBIN TOTAL: 1.2 mg/dL (ref 0.3–1.2)
BUN: 11 mg/dL (ref 6–20)
CALCIUM: 8.9 mg/dL (ref 8.9–10.3)
CO2: 23 mmol/L (ref 22–32)
CREATININE: 1.37 mg/dL — AB (ref 0.61–1.24)
Chloride: 103 mmol/L (ref 101–111)
GFR calc Af Amer: >60 mL/min (ref >60)
GFR calc non Af Amer: 56 mL/min — ABNORMAL LOW (ref >60)
GLUCOSE: 109 mg/dL — AB (ref 65–99)
Potassium: 3.4 mmol/L — ABNORMAL LOW (ref 3.5–5.1)
Sodium: 140 mmol/L (ref 135–145)
TOTAL PROTEIN: 7 g/dL (ref 6.5–8.1)

## 2017-08-15 LAB — CBC
HCT: 38 % — ABNORMAL LOW (ref 39.0–52.0)
HEMOGLOBIN: 12.7 g/dL — AB (ref 13.0–17.0)
MCH: 23.3 pg — AB (ref 26.0–34.0)
MCHC: 33.4 g/dL (ref 30.0–36.0)
MCV: 69.7 fL — ABNORMAL LOW (ref 78.0–100.0)
PLATELETS: 178 10*3/uL (ref 150–400)
RBC: 5.45 MIL/uL (ref 4.22–5.81)
RDW: 14.8 % (ref 11.5–15.5)
WBC: 9.9 10*3/uL (ref 4.0–10.5)

## 2017-08-15 NOTE — ED Triage Notes (Signed)
Patient reports left lower flank pain with concentrated dark brown urine onset Tuesday this week , denies dysuria or fever .

## 2017-08-16 ENCOUNTER — Emergency Department (HOSPITAL_COMMUNITY): Payer: Medicaid Other

## 2017-08-16 ENCOUNTER — Inpatient Hospital Stay (HOSPITAL_COMMUNITY)
Admission: EM | Admit: 2017-08-16 | Discharge: 2017-08-20 | DRG: 558 | Disposition: A | Discharge status: Home / Self Care | Payer: Medicaid Other | Attending: Nephrology | Admitting: Nephrology

## 2017-08-16 DIAGNOSIS — F209 Schizophrenia, unspecified: Secondary | ICD-10-CM | POA: Diagnosis present

## 2017-08-16 DIAGNOSIS — F1490 Cocaine use, unspecified, uncomplicated: Secondary | ICD-10-CM | POA: Diagnosis present

## 2017-08-16 DIAGNOSIS — M7918 Myalgia, other site: Secondary | ICD-10-CM | POA: Diagnosis not present

## 2017-08-16 DIAGNOSIS — R7989 Other specified abnormal findings of blood chemistry: Secondary | ICD-10-CM

## 2017-08-16 DIAGNOSIS — I10 Essential (primary) hypertension: Secondary | ICD-10-CM | POA: Diagnosis present

## 2017-08-16 DIAGNOSIS — Z79899 Other long term (current) drug therapy: Secondary | ICD-10-CM | POA: Diagnosis not present

## 2017-08-16 DIAGNOSIS — M6282 Rhabdomyolysis: Secondary | ICD-10-CM | POA: Diagnosis present

## 2017-08-16 DIAGNOSIS — D649 Anemia, unspecified: Secondary | ICD-10-CM | POA: Diagnosis present

## 2017-08-16 DIAGNOSIS — M549 Dorsalgia, unspecified: Secondary | ICD-10-CM

## 2017-08-16 DIAGNOSIS — G8194 Hemiplegia, unspecified affecting left nondominant side: Secondary | ICD-10-CM | POA: Diagnosis present

## 2017-08-16 DIAGNOSIS — R740 Nonspecific elevation of levels of transaminase and lactic acid dehydrogenase [LDH]: Secondary | ICD-10-CM | POA: Diagnosis present

## 2017-08-16 DIAGNOSIS — G4700 Insomnia, unspecified: Secondary | ICD-10-CM | POA: Diagnosis present

## 2017-08-16 DIAGNOSIS — F319 Bipolar disorder, unspecified: Secondary | ICD-10-CM | POA: Diagnosis present

## 2017-08-16 DIAGNOSIS — M25559 Pain in unspecified hip: Secondary | ICD-10-CM

## 2017-08-16 DIAGNOSIS — F418 Other specified anxiety disorders: Secondary | ICD-10-CM | POA: Diagnosis not present

## 2017-08-16 DIAGNOSIS — Z7901 Long term (current) use of anticoagulants: Secondary | ICD-10-CM | POA: Diagnosis not present

## 2017-08-16 DIAGNOSIS — R001 Bradycardia, unspecified: Secondary | ICD-10-CM | POA: Diagnosis not present

## 2017-08-16 DIAGNOSIS — I482 Chronic atrial fibrillation, unspecified: Secondary | ICD-10-CM | POA: Diagnosis present

## 2017-08-16 DIAGNOSIS — F310 Bipolar disorder, current episode hypomanic: Secondary | ICD-10-CM | POA: Diagnosis not present

## 2017-08-16 DIAGNOSIS — F1020 Alcohol dependence, uncomplicated: Secondary | ICD-10-CM | POA: Diagnosis present

## 2017-08-16 DIAGNOSIS — M109 Gout, unspecified: Secondary | ICD-10-CM | POA: Diagnosis present

## 2017-08-16 DIAGNOSIS — R109 Unspecified abdominal pain: Secondary | ICD-10-CM

## 2017-08-16 DIAGNOSIS — R945 Abnormal results of liver function studies: Secondary | ICD-10-CM

## 2017-08-16 DIAGNOSIS — E785 Hyperlipidemia, unspecified: Secondary | ICD-10-CM | POA: Diagnosis present

## 2017-08-16 DIAGNOSIS — G8929 Other chronic pain: Secondary | ICD-10-CM | POA: Diagnosis present

## 2017-08-16 DIAGNOSIS — M545 Low back pain: Secondary | ICD-10-CM | POA: Diagnosis not present

## 2017-08-16 DIAGNOSIS — I959 Hypotension, unspecified: Secondary | ICD-10-CM | POA: Diagnosis not present

## 2017-08-16 DIAGNOSIS — M1612 Unilateral primary osteoarthritis, left hip: Secondary | ICD-10-CM | POA: Diagnosis present

## 2017-08-16 DIAGNOSIS — Z87820 Personal history of traumatic brain injury: Secondary | ICD-10-CM | POA: Diagnosis not present

## 2017-08-16 DIAGNOSIS — Z791 Long term (current) use of non-steroidal anti-inflammatories (NSAID): Secondary | ICD-10-CM | POA: Diagnosis not present

## 2017-08-16 LAB — CK
CK TOTAL: 48703 U/L — AB (ref 49–397)
Total CK: 44530 U/L — ABNORMAL HIGH (ref 49–397)
Total CK: 39282 U/L — ABNORMAL HIGH (ref 49–397)

## 2017-08-16 LAB — LIPASE, BLOOD
LIPASE: 21 U/L (ref 11–51)
Lipase: 19 U/L (ref 11–51)

## 2017-08-16 LAB — ACETAMINOPHEN LEVEL: Acetaminophen (Tylenol), Serum: <10 ug/mL — ABNORMAL LOW (ref 10–30)

## 2017-08-16 LAB — TSH: TSH: 1.819 u[IU]/mL (ref 0.350–4.500)

## 2017-08-16 LAB — RAPID URINE DRUG SCREEN, HOSP PERFORMED
Amphetamines: NOT DETECTED
BENZODIAZEPINES: POSITIVE — AB
Barbiturates: NOT DETECTED
COCAINE: POSITIVE — AB
Opiates: NOT DETECTED
Tetrahydrocannabinol: NOT DETECTED

## 2017-08-16 LAB — ETHANOL

## 2017-08-16 MED ORDER — THIAMINE HCL 100 MG/ML IJ SOLN
100.0000 mg | Freq: Every day | INTRAMUSCULAR | Status: DC
  Administered 2017-08-16: 100 mg via INTRAVENOUS
  Filled 2017-08-16 (×2): qty 2

## 2017-08-16 MED ORDER — ALLOPURINOL 100 MG PO TABS
100.0000 mg | ORAL_TABLET | Freq: Every day | ORAL | Status: DC
  Administered 2017-08-16 – 2017-08-20 (×5): 100 mg via ORAL
  Filled 2017-08-16 (×5): qty 1

## 2017-08-16 MED ORDER — ZOLPIDEM TARTRATE 5 MG PO TABS
10.0000 mg | ORAL_TABLET | Freq: Every evening | ORAL | Status: DC | PRN
  Administered 2017-08-16 – 2017-08-19 (×4): 10 mg via ORAL
  Filled 2017-08-16 (×4): qty 2

## 2017-08-16 MED ORDER — ENOXAPARIN SODIUM 40 MG/0.4ML ~~LOC~~ SOLN: 40.0000 mg | SUBCUTANEOUS | Status: DC

## 2017-08-16 MED ORDER — QUETIAPINE FUMARATE ER 50 MG PO TB24
150.0000 mg | ORAL_TABLET | Freq: Every day | ORAL | Status: DC
  Administered 2017-08-16 – 2017-08-19 (×4): 150 mg via ORAL
  Filled 2017-08-16 (×6): qty 3

## 2017-08-16 MED ORDER — SODIUM BICARBONATE 8.4 % IV SOLN
INTRAVENOUS | Status: DC
  Filled 2017-08-16 (×2): qty 50

## 2017-08-16 MED ORDER — DILTIAZEM HCL ER COATED BEADS 240 MG PO CP24
240.0000 mg | ORAL_CAPSULE | Freq: Every day | ORAL | Status: DC
  Administered 2017-08-16 – 2017-08-20 (×4): 240 mg via ORAL
  Filled 2017-08-16: qty 2
  Filled 2017-08-16 (×4): qty 1

## 2017-08-16 MED ORDER — FOLIC ACID 1 MG PO TABS
1.0000 mg | ORAL_TABLET | Freq: Every day | ORAL | Status: DC
  Administered 2017-08-16 – 2017-08-20 (×5): 1 mg via ORAL
  Filled 2017-08-16 (×5): qty 1

## 2017-08-16 MED ORDER — ACETAMINOPHEN 325 MG PO TABS
650.0000 mg | ORAL_TABLET | Freq: Four times a day (QID) | ORAL | Status: DC | PRN
  Administered 2017-08-16: 650 mg via ORAL
  Filled 2017-08-16: qty 2

## 2017-08-16 MED ORDER — TRAMADOL HCL 50 MG PO TABS
50.0000 mg | ORAL_TABLET | Freq: Four times a day (QID) | ORAL | Status: DC | PRN
  Administered 2017-08-16 – 2017-08-19 (×7): 50 mg via ORAL
  Filled 2017-08-16 (×7): qty 1

## 2017-08-16 MED ORDER — RISPERIDONE 1 MG PO TABS
1.0000 mg | ORAL_TABLET | Freq: Every day | ORAL | Status: DC
  Administered 2017-08-17 – 2017-08-20 (×4): 1 mg via ORAL
  Filled 2017-08-16 (×4): qty 1

## 2017-08-16 MED ORDER — RISPERIDONE 2 MG PO TABS
2.0000 mg | ORAL_TABLET | Freq: Every day | ORAL | Status: DC
  Administered 2017-08-16 – 2017-08-19 (×4): 2 mg via ORAL
  Filled 2017-08-16 (×5): qty 1

## 2017-08-16 MED ORDER — ONDANSETRON HCL 4 MG PO TABS: 4.0000 mg | ORAL_TABLET | Freq: Four times a day (QID) | ORAL | Status: DC | PRN

## 2017-08-16 MED ORDER — LORAZEPAM 2 MG/ML IJ SOLN: 1.0000 mg | Freq: Four times a day (QID) | INTRAMUSCULAR | Status: AC | PRN

## 2017-08-16 MED ORDER — POTASSIUM CHLORIDE IN NACL 20-0.9 MEQ/L-% IV SOLN
INTRAVENOUS | Status: DC
  Administered 2017-08-16 – 2017-08-20 (×9): via INTRAVENOUS
  Filled 2017-08-16 (×11): qty 1000

## 2017-08-16 MED ORDER — MORPHINE SULFATE (PF) 4 MG/ML IV SOLN
4.0000 mg | Freq: Once | INTRAVENOUS | Status: AC
  Administered 2017-08-16: 4 mg via INTRAVENOUS
  Filled 2017-08-16: qty 1

## 2017-08-16 MED ORDER — ADULT MULTIVITAMIN W/MINERALS CH
1.0000 | ORAL_TABLET | Freq: Every day | ORAL | Status: DC
  Administered 2017-08-16 – 2017-08-20 (×5): 1 via ORAL
  Filled 2017-08-16 (×5): qty 1

## 2017-08-16 MED ORDER — RISPERIDONE 1 MG PO TABS
1.0000 mg | ORAL_TABLET | Freq: Two times a day (BID) | ORAL | Status: DC
  Filled 2017-08-16: qty 2

## 2017-08-16 MED ORDER — APIXABAN 5 MG PO TABS
5.0000 mg | ORAL_TABLET | Freq: Two times a day (BID) | ORAL | Status: DC
  Administered 2017-08-16 – 2017-08-20 (×8): 5 mg via ORAL
  Filled 2017-08-16 (×9): qty 1

## 2017-08-16 MED ORDER — MAGNESIUM SULFATE 2 GM/50ML IV SOLN
2.0000 g | Freq: Once | INTRAVENOUS | Status: AC
  Administered 2017-08-16: 2 g via INTRAVENOUS
  Filled 2017-08-16: qty 50

## 2017-08-16 MED ORDER — IOPAMIDOL (ISOVUE-300) INJECTION 61%
INTRAVENOUS | Status: AC
  Administered 2017-08-16: 100 mL
  Filled 2017-08-16: qty 100

## 2017-08-16 MED ORDER — FERROUS SULFATE 325 (65 FE) MG PO TABS
325.0000 mg | ORAL_TABLET | Freq: Every day | ORAL | Status: DC
  Administered 2017-08-17 – 2017-08-20 (×4): 325 mg via ORAL
  Filled 2017-08-16 (×5): qty 1

## 2017-08-16 MED ORDER — VITAMIN B-1 100 MG PO TABS
100.0000 mg | ORAL_TABLET | Freq: Every day | ORAL | Status: DC
  Administered 2017-08-17 – 2017-08-20 (×4): 100 mg via ORAL
  Filled 2017-08-16 (×4): qty 1

## 2017-08-16 MED ORDER — ONDANSETRON HCL 4 MG/2ML IJ SOLN
4.0000 mg | Freq: Four times a day (QID) | INTRAMUSCULAR | Status: DC | PRN
  Administered 2017-08-16: 4 mg via INTRAVENOUS
  Filled 2017-08-16: qty 2

## 2017-08-16 MED ORDER — METHYLPREDNISOLONE SODIUM SUCC 125 MG IJ SOLR: 40.0000 mg | Freq: Two times a day (BID) | INTRAMUSCULAR | Status: DC

## 2017-08-16 MED ORDER — SODIUM CHLORIDE 0.9 % IV BOLUS (SEPSIS)
1000.0000 mL | Freq: Once | INTRAVENOUS | Status: AC
  Administered 2017-08-16: 1000 mL via INTRAVENOUS

## 2017-08-16 MED ORDER — LORAZEPAM 1 MG PO TABS: 1.0000 mg | ORAL_TABLET | Freq: Four times a day (QID) | ORAL | Status: AC | PRN

## 2017-08-16 MED ORDER — ALPRAZOLAM 0.5 MG PO TABS: 0.5000 mg | ORAL_TABLET | Freq: Three times a day (TID) | ORAL | Status: DC | PRN

## 2017-08-16 MED ORDER — SODIUM BICARBONATE 8.4 % IV SOLN
INTRAVENOUS | Status: AC
  Administered 2017-08-16: 12:00:00 via INTRAVENOUS
  Filled 2017-08-16 (×2): qty 50

## 2017-08-16 MED ORDER — METHOCARBAMOL 500 MG PO TABS
500.0000 mg | ORAL_TABLET | Freq: Three times a day (TID) | ORAL | Status: DC
  Administered 2017-08-16 – 2017-08-18 (×7): 500 mg via ORAL
  Filled 2017-08-16 (×7): qty 1

## 2017-08-16 MED ORDER — METHYLPREDNISOLONE SODIUM SUCC 125 MG IJ SOLR
40.0000 mg | Freq: Two times a day (BID) | INTRAMUSCULAR | Status: AC
  Administered 2017-08-16 – 2017-08-17 (×2): 40 mg via INTRAVENOUS
  Filled 2017-08-16 (×2): qty 2

## 2017-08-16 MED ORDER — OXYCODONE-ACETAMINOPHEN 5-325 MG PO TABS
1.0000 | ORAL_TABLET | Freq: Three times a day (TID) | ORAL | Status: DC | PRN
  Administered 2017-08-16: 1 via ORAL
  Filled 2017-08-16 (×2): qty 1

## 2017-08-16 MED ORDER — HEPARIN SODIUM (PORCINE) 5000 UNIT/ML IJ SOLN: 5000.0000 [IU] | Freq: Three times a day (TID) | INTRAMUSCULAR | Status: DC

## 2017-08-16 MED ORDER — ONDANSETRON HCL 4 MG/2ML IJ SOLN
4.0000 mg | Freq: Once | INTRAMUSCULAR | Status: AC
  Administered 2017-08-16: 4 mg via INTRAVENOUS
  Filled 2017-08-16: qty 2

## 2017-08-16 MED ORDER — TRAZODONE HCL 100 MG PO TABS
300.0000 mg | ORAL_TABLET | Freq: Every day | ORAL | Status: DC
  Administered 2017-08-16 – 2017-08-19 (×4): 300 mg via ORAL
  Filled 2017-08-16 (×4): qty 3

## 2017-08-16 MED ORDER — POTASSIUM CHLORIDE IN NACL 20-0.9 MEQ/L-% IV SOLN: INTRAVENOUS | Status: DC

## 2017-08-16 MED ORDER — ACETAMINOPHEN 650 MG RE SUPP: 650.0000 mg | Freq: Four times a day (QID) | RECTAL | Status: DC | PRN

## 2017-08-16 MED ORDER — TIZANIDINE HCL 4 MG PO TABS
2.0000 mg | ORAL_TABLET | Freq: Four times a day (QID) | ORAL | Status: DC | PRN
  Administered 2017-08-16 – 2017-08-18 (×2): 2 mg via ORAL
  Filled 2017-08-16 (×2): qty 1

## 2017-08-16 NOTE — ED Notes (Signed)
Per Hubert AzureBrooke H, RN, report was called to floor prior to 1840. Pt able to be transported at [id number]

## 2017-08-16 NOTE — ED Provider Notes (Signed)
[facility] EMERGENCY DEPARTMENT Provider Note   CSN: [id number] Arrival date & time: 08/15/17  1943     History   Chief Complaint Chief Complaint  Patient presents with  . Flank Pain    Left Lower    HPI Garrett Ferrell is a 57 y.o. male.  Patient reports 2 days of "kidney pain" states the pain is constant in his left flank, nothing makes it better nothing makes it worse.  Denies any falls or injuries.  Also states his urine has been dark.  Does not know if his urine has been bloody.  Denies any pain with urination, urgency or frequency.  States he has not had a bowel movement for several days.  Denies any vomiting or fever.  No testicular pain.  No chest pain or shortness of breath.  Patient does have a history of atrial fibrillation on Eliquis.  History of previous stroke.  Does still have a gallbladder and appendix.  No history of kidney stones.  He has been taking Percocet at home for the pain which she takes on a chronic basis.  Denies taking any excessive amounts of pain medication or extra Tylenol.   The history is provided by the patient.  Flank Pain  Associated symptoms include abdominal pain. Pertinent negatives include no chest pain, no headaches and no shortness of breath.    Past Medical History:  Diagnosis Date  . Anemia    takes Iron pill daily  . Anxiety    takes Xanax daily as needed  . Arthritis   . Atrial fibrillation (HCC)    takes Eliquis daily. Last dose will be 10/03/16  . Depression    takes Risperdal daily  . DVT (deep venous thrombosis) (HCC)    both legs in 2016  . Dyspnea    when laying on back  . Gout    takes Allopurinol daily  . Headache    occasionally  . History of shingles 2010  . Hyperlipidemia    takes Atorvastatin daily  . Hyperlipidemia    takes Atorvastatin daily  . Hypertension    takes Metoprolol daily  . Insomnia    takes Ambien and Trazodone nightly  . Joint pain   . Joint swelling    right ankle  .  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 Nocturia   . Seizures (HCC)    last one in 2002    Patient Active Problem List   Diagnosis Date Noted  . Syncope 05/29/2017  . Bipolar disorder (HCC) 05/29/2017  . Schizophrenia (HCC) 05/29/2017  . TBI (traumatic brain injury) (HCC) 05/29/2017  . Unwitnessed fall 05/29/2017  . Alcohol abuse with intoxication (HCC) 05/29/2017  . Slurred speech 05/29/2017  . Chest pain 08/07/2016  . Cocaine use 08/07/2016  . Chronic musculoskeletal pain 08/07/2016  . CKD (chronic kidney disease) 08/07/2016  . Depression with anxiety 08/07/2016  . Traumatic arthritis of right ankle 07/31/2016  . 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 Osteoarthritis of right subtalar joint   . Right ankle pain 01/24/2016  . Bilateral hip pain 01/03/2016  . Atrial fibrillation, chronic (HCC) 11/18/2015  . Essential hypertension 11/18/2015  . Hyperlipidemia 11/18/2015  . DVT, bilateral lower limbs (HCC) 11/18/2015  . Pulmonary embolus (HCC) 11/18/2015    Past Surgical History:  Procedure Laterality Date  . ANKLE ARTHROSCOPY Right 04/20/2016   Procedure: ANKLE ARTHROSCOPY;  Surgeon: Nadara Mustard, MD;  Location: [facility] OR;  Service: Orthopedics;  Laterality:  Right;  [name]. ANKLE ARTHROSCOPY WITH FUSION Right 10/05/2016   Procedure: RIGHT ANKLE ARTHROSCOPIC FUSION;  Surgeon: Nadara Mustarduda, Marcus V, MD;  [facility];  Service: Orthopedics;  Laterality: Right;  . ANKLE FUSION Right 04/20/2016   Procedure: Right Posterior Arthroscopic Subtalar Arthrodesis;  Surgeon: Nadara MustardMarcus V Duda, MD;  [facility];  Service: Orthopedics;  Laterality: Right;  . ANKLE SURGERY Right    plates/screws  . COLONOSCOPY WITH PROPOFOL N/A 11/27/2016   Procedure: COLONOSCOPY WITH PROPOFOL;  Surgeon: Charolett BumpersJohnson, Martin K, MD;  Location: [facility];  Service: Endoscopy;  Laterality: N/A;  . HERNIA REPAIR     1986-umbilical  . RIGHT HEART CATH  2016   [facility] his [facility]PE         Home Medications    Prior to Admission medications   Medication Sig Start Date End Date Taking? Authorizing Provider  allopurinol (ZYLOPRIM) 100 MG tablet Take 100 mg by mouth daily. 12/22/15   [provider]  ALPRAZolam Prudy Feeler(XANAX) 0.5 MG tablet Take 0.5 mg by mouth every 8 (eight) hours as needed for anxiety.  10/21/15   [provider]  atorvastatin (LIPITOR) 10 MG tablet Take 10 mg by mouth every evening.     [provider]  diltiazem (CARDIZEM CD) 240 MG 24 hr capsule Take 1 capsule (240 mg total) by mouth daily. 08/09/16   Joseph ArtVann, Jessica U, DO  ELIQUIS 5 MG TABS tablet Take 1 tablet (5 mg total) by mouth 2 (two) times daily. 09/05/16   Azalee CourseMeng, Hao, PA  ferrous sulfate 325 (65 FE) MG EC tablet Take 325 mg by mouth daily with breakfast.    [provider]  losartan (COZAAR) 25 MG tablet Take 25 mg by mouth daily. 09/24/16   [provider]  oxyCODONE-acetaminophen (PERCOCET/ROXICET) 5-325 MG tablet Take 1 tablet by mouth every 8 (eight) hours as needed for severe pain. 07/18/17   Nadara Mustarduda, Marcus V, MD  oxyCODONE-acetaminophen (ROXICET) 5-325 MG tablet Take 1 tablet by mouth every 8 (eight) hours as needed for severe pain. 02/07/17   Nadara Mustarduda, Marcus V, MD  QUEtiapine (SEROQUEL) 50 MG tablet Take 1 tablet (50 mg total) by mouth at bedtime. 06/01/17   Zannie CoveJoseph, Preetha, MD  risperiDONE (RISPERDAL) 1 MG tablet Take 1-2 tablets (1-2 mg total) by mouth 2 (two) times daily. Take 1tab (1mg ) in morning and 2 tabs (2mg )at night 06/01/17   Zannie CoveJoseph, Preetha, MD  trazodone (DESYREL) 300 MG tablet Take 300 mg by mouth at bedtime. 10/24/15   [provider]  VIAGRA 100 MG tablet Take 100 mg by mouth daily as needed for erectile dysfunction.  09/30/15   [provider]  zolpidem (AMBIEN) 10 MG tablet Take 1 tablet (10 mg total) by mouth at bedtime as needed for sleep. 06/01/17   Zannie CoveJoseph, Preetha, MD    Family History Family History  Problem Relation Age of  Onset  . Heart Problems Mother   . Heart attack Father        age 1940s    Social History Social History   Tobacco Use  . Smoking status: Never Smoker  . Smokeless tobacco: Never Used  Substance Use Topics  . Alcohol use: Yes  . Drug use: No     Allergies   Patient has no known allergies.   Review of Systems Review of Systems  Constitutional: Positive for activity change and appetite change. Negative for fever.  HENT: Negative for congestion and rhinorrhea.   Eyes: Negative  for visual disturbance.  Respiratory: Negative for cough, chest tightness and shortness of breath.   Cardiovascular: Negative for chest pain.  Gastrointestinal: Positive for abdominal pain, constipation and nausea. Negative for vomiting.  Genitourinary: Positive for dysuria, flank pain and hematuria. Negative for testicular pain.  Musculoskeletal: Negative for arthralgias and myalgias.  Skin: Negative for rash.  Neurological: Negative for dizziness, weakness and headaches.    all other systems are negative except as noted in the HPI and PMH.    Physical Exam Updated Vital Signs BP 122/62 (BP Location: Left Arm)   Pulse 98   Temp 99 F (37.2 C) (Oral)   Resp 16   Ht 6' (1.829 m)   Wt 111.1 kg (245 lb)   SpO2 100%   BMI 33.23 kg/m   Physical Exam  Constitutional: He is oriented to person, place, and time. He appears well-developed and well-nourished. No distress.  HENT:  Head: Normocephalic and atraumatic.  Mouth/Throat: Oropharynx is clear and moist. No oropharyngeal exudate.  Eyes: Conjunctivae and EOM are normal. Pupils are equal, round, and reactive to light.  Neck: Normal range of motion. Neck supple.  No meningismus.  Cardiovascular: Normal rate, regular rhythm, normal heart sounds and intact distal pulses.  No murmur heard. Pulmonary/Chest: Effort normal and breath sounds normal. No respiratory distress.  Abdominal: Soft. There is tenderness. There is no rebound and no guarding.    Right upper quadrant and lower quadrant tenderness with voluntary guarding.  Abdomen is soft.  Genitourinary:  Genitourinary Comments: No testicular tenderness  Musculoskeletal: Normal range of motion. He exhibits no edema or tenderness.  Left CVA tenderness  Neurological: He is alert and oriented to person, place, and time. No cranial nerve deficit. He exhibits normal muscle tone. Coordination normal.  No ataxia on finger to nose bilaterally. No pronator drift. 5/5 strength throughout. CN 2-12 intact.Equal grip strength. Sensation intact.   Skin: Skin is warm.  Psychiatric: He has a normal mood and affect. His behavior is normal.  Nursing note and vitals reviewed.    ED Treatments / Results  Labs (all labs ordered are listed, but only abnormal results are displayed) Labs Reviewed  COMPREHENSIVE METABOLIC PANEL - Abnormal; Notable for the following components:      Result Value   Potassium 3.4 (*)    Glucose, Bld 109 (*)    Creatinine, Ser 1.37 (*)    AST 803 (*)    ALT 141 (*)    GFR calc non Af Amer 56 (*)    All other components within normal limits  CBC - Abnormal; Notable for the following components:   Hemoglobin 12.7 (*)    HCT 38.0 (*)    MCV 69.7 (*)    MCH 23.3 (*)    All other components within normal limits  URINALYSIS, ROUTINE W REFLEX MICROSCOPIC - Abnormal; Notable for the following components:   APPearance HAZY (*)    Hgb urine dipstick LARGE (*)    Ketones, ur 20 (*)    Protein, ur 100 (*)    Bacteria, UA RARE (*)    Squamous Epithelial / LPF 0-5 (*)    All other components within normal limits  ACETAMINOPHEN LEVEL - Abnormal; Notable for the following components:   Acetaminophen (Tylenol), Serum <10 (*)    All other components within normal limits  LIPASE, BLOOD  ETHANOL  HEPATITIS PANEL, ACUTE  CK    EKG  EKG Interpretation None       Radiology Ct Abdomen Pelvis W  Contrast  Result Date: 08/16/2017 CLINICAL DATA:  Abdominal pain.  Dark  brown urine. EXAM: CT ABDOMEN AND PELVIS WITH CONTRAST TECHNIQUE: Multidetector CT imaging of the abdomen and pelvis was performed using the standard protocol following bolus administration of intravenous contrast. CONTRAST:  ISOVUE-300 IOPAMIDOL (ISOVUE-300) INJECTION 61% COMPARISON:  None. FINDINGS: Lower chest: No basilar pulmonary nodules or pleural effusion. No apical pericardial effusion. Hepatobiliary: Normal hepatic contours and density. No visible biliary dilatation. Normal gallbladder. Pancreas: Normal parenchymal contours without ductal dilatation. No peripancreatic fluid collection. Spleen: Normal. Adrenals/Urinary Tract: --Adrenal glands: Normal. --Right kidney/ureter: No hydronephrosis, perinephric stranding or nephrolithiasis. No obstructing ureteral stones. --Left kidney/ureter: No hydronephrosis, perinephric stranding or nephrolithiasis. No obstructing ureteral stones. --Urinary bladder: Normal appearance for the degree of distention. Stomach/Bowel: --Stomach/Duodenum: No hiatal hernia or other gastric abnormality. Normal duodenal course. --Small bowel: No dilatation or inflammation. --Colon: No focal abnormality. --Appendix: Normal. Vascular/Lymphatic: Minimal aortic atherosclerotic calcification. No abdominal or pelvic lymphadenopathy. Reproductive: Normal prostate and seminal vesicles. Musculoskeletal. No bony spinal canal stenosis or focal osseous abnormality. Other: None. IMPRESSION: No acute abdominal or pelvic abnormality.  Normal appendix. Electronically Signed   By: Deatra Robinson M.D.   On: 08/16/2017 06:39    Procedures Procedures (including critical care time)  Medications Ordered in ED Medications  morphine 4 MG/ML injection 4 mg (not administered)  ondansetron (ZOFRAN) injection 4 mg (not administered)  sodium chloride 0.9 % bolus 1,000 mL (not administered)     Initial Impression / Assessment and Plan / ED Course  I have reviewed the triage vital signs and the  nursing notes.  Pertinent labs & imaging results that were available during my care of the patient were reviewed by me and considered in my medical decision making (see chart for details).     Patient with left-sided flank pain and dark urine for several days.  However on exam he has significant right-sided lower abdominal pain with voluntary guarding.  Labs obtained in triage show transaminitis with normal bilirubin.  Does have hematuria without infection. However no RBCs in urine.  Will check CK to evaluate for rhabdomyolysis. Cr stable.  CT obtained given RLQ pain.  Appendix is normal. No kidney stones.  AST> ALT elevated, likely due to alcohol abuse.  Per notes, patient drinks a bottle of vodka every two days. He seems to minimize his alcohol intake.   IV hydration. CK pending.  Will also obtained RUQ [place] to evaluate elevated LFTs.  Care transferred to Dr. Fayrene Fearing at shift change.    Final Clinical Impressions(s) / ED Diagnoses   Final diagnoses:  Elevated LFTs  Left flank pain    ED Discharge Orders    None       Benson Porcaro, Jeannett Senior, MD 08/16/17 [phone]

## 2017-08-16 NOTE — ED Notes (Signed)
Thin liquid diet ordered.

## 2017-08-16 NOTE — H&P (Addendum)
[facility] History and Physical  Garrett Ferrell ZOX:[id number] DOB: Mar 14, 1961 DOA: 08/16/2017  Referring physician:   PCP: Jackie Plum, MD   Chief Complaint:   HPI:   57 year old male with a history of chronic atrial fibrillation on Eliquis, hypertension, traumatic brain injury, schizophrenia/bipolar disorder, depression, anxiety , who presents today with left lower flank pain and dark urine. He denies any fever, chills, rigors. He complains of diffuse abdominal pain, but no nausea vomiting. Patient has a history of heavy alcohol use and has been drinking vodka. He does not report taking any new medications. He denies any hematemesis, hematochezia or melena ED course BP 122/62 (BP Location: Left Arm)   Pulse 98   Temp 99 F (37.2 C) (Oral)   Resp 16   Ht 6' (1.829 m)   Wt 111.1 kg (245 lb)   SpO2 100%   BMI 33.23 kg/m  Patient found to have an AST of 803, ALT 141, potassium 3.4, [place] [address] UA negative for UTI, EtOH level negative, CT abdomen pelvis negative for any acute abnormality. Ultrasound shows a 4 mm gallbladder polyp. UDS pending     Review of Systems: negative for the following  Constitutional: Positive for activity change and appetite change. Negative for fever.  HENT: Negative for congestion and rhinorrhea.   Eyes: Negative for visual disturbance.  Respiratory: Negative for cough, chest tightness and shortness of breath.   Cardiovascular: Negative for chest pain.  Gastrointestinal: Positive for abdominal pain, constipation and nausea. Negative for vomiting.  Genitourinary: Positive for dysuria, flank pain and hematuria. Negative for testicular pain.  Musculoskeletal: Denies myalgias, back pain, joint swelling, arthralgias and gait problem.  Skin: Denies pallor, rash and wound.  Neurological: Denies dizziness, seizures, syncope, weakness, light-headedness, numbness and headaches.  Hematological: Denies adenopathy. Easy bruising, personal or family  bleeding history  Psychiatric/Behavioral: Denies suicidal ideation, mood changes, confusion, nervousness, sleep disturbance and agitation       Past Medical History:  Diagnosis Date  . Anemia    takes Iron pill daily  . Anxiety    takes Xanax daily as needed  . Arthritis   . Atrial fibrillation (HCC)    takes Eliquis daily. Last dose will be 10/03/16  . Depression    takes Risperdal daily  . DVT (deep venous thrombosis) (HCC)    both legs in 2016  . Dyspnea    when laying on back  . Gout    takes Allopurinol daily  . Headache    occasionally  . History of shingles 2010  . Hyperlipidemia    takes Atorvastatin daily  . Hyperlipidemia    takes Atorvastatin daily  . Hypertension    takes Metoprolol daily  . Insomnia    takes Ambien and Trazodone nightly  . Joint pain   . Joint swelling    right ankle  . 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 Nocturia   . Seizures (HCC)    last one in 2002     Past Surgical History:  Procedure Laterality Date  . ANKLE ARTHROSCOPY Right 04/20/2016   Procedure: ANKLE ARTHROSCOPY;  Surgeon: Nadara Mustard, MD;  Location: [facility] OR;  Service: Orthopedics;  Laterality: Right;  . ANKLE ARTHROSCOPY WITH FUSION Right 10/05/2016   Procedure: RIGHT ANKLE ARTHROSCOPIC FUSION;  Surgeon: Nadara Mustard, MD;  Location: [facility] OR;  Service: Orthopedics;  Laterality: Right;  . ANKLE FUSION Right 04/20/2016   Procedure: Right Posterior Arthroscopic Subtalar Arthrodesis;  Surgeon: Randa Evens  Lajoyce Cornersuda, MD;  Location: [facility] OR;  Service: Orthopedics;  Laterality: Right;  . ANKLE SURGERY Right    plates/screws  . COLONOSCOPY WITH PROPOFOL N/A 11/27/2016   Procedure: COLONOSCOPY WITH PROPOFOL;  Surgeon: Charolett BumpersJohnson, Martin K, MD;  Location: [facility] ENDOSCOPY;  Service: Endoscopy;  Laterality: N/A;  . HERNIA REPAIR     1986-umbilical  . RIGHT HEART CATH  2016   [facility] his D[facility]/PE      Social History:  reports that  has never smoked. he  has never used smokeless tobacco. He reports that he drinks alcohol. He reports that he does not use drugs.    No Known Allergies  Family History  Problem Relation Age of Onset  . Heart Problems Mother   . Heart attack Father        age 9040s        Prior to Admission medications   Medication Sig Start Date End Date Taking? Authorizing Provider  allopurinol (ZYLOPRIM) 100 MG tablet Take 100 mg by mouth daily. 12/22/15  Yes [provider]  ALPRAZolam Prudy Feeler(XANAX) 0.5 MG tablet Take 0.5 mg by mouth every 8 (eight) hours as needed for anxiety.  10/21/15  Yes [provider]  atorvastatin (LIPITOR) 10 MG tablet Take 10 mg by mouth every evening.    Yes [provider]  diltiazem (CARDIZEM CD) 240 MG 24 hr capsule Take 1 capsule (240 mg total) by mouth daily. 08/09/16  Yes Vann, Jessica U, DO  ELIQUIS 5 MG TABS tablet Take 1 tablet (5 mg total) by mouth 2 (two) times daily. 09/05/16  Yes Azalee CourseMeng, Hao, PA  ferrous sulfate 325 (65 FE) MG EC tablet Take 325 mg by mouth daily with breakfast.   Yes [provider]  losartan (COZAAR) 25 MG tablet Take 25 mg by mouth daily. 09/24/16  Yes [provider]  meloxicam (MOBIC) 15 MG tablet Take 15 mg by mouth daily. 07/29/17  Yes [provider]  oxyCODONE-acetaminophen (PERCOCET/ROXICET) 5-325 MG tablet Take 1 tablet by mouth every 8 (eight) hours as needed for severe pain. 07/18/17  Yes Nadara Mustarduda, Marcus V, MD  QUEtiapine Fumarate (SEROQUEL XR) 150 MG 24 hr tablet Take 150 mg by mouth at bedtime. 07/27/17  Yes [provider]  risperiDONE (RISPERDAL) 1 MG tablet Take 1-2 tablets (1-2 mg total) by mouth 2 (two) times daily. Take 1tab (1mg ) in morning and 2 tabs (2mg )at night 06/01/17  Yes Zannie CoveJoseph, Preetha, MD  tiZANidine (ZANAFLEX) 2 MG tablet Take 2-4 mg by mouth every 8 (eight) hours as needed. 07/27/17  Yes [provider]  trazodone (DESYREL) 300 MG tablet Take 300 mg by mouth at bedtime. 10/24/15  Yes  [provider]  VIAGRA 100 MG tablet Take 100 mg by mouth daily as needed for erectile dysfunction.  09/30/15  Yes [provider]  zolpidem (AMBIEN) 10 MG tablet Take 1 tablet (10 mg total) by mouth at bedtime as needed for sleep. 06/01/17  Yes Zannie CoveJoseph, Preetha, MD  oxyCODONE-acetaminophen (ROXICET) 5-325 MG tablet Take 1 tablet by mouth every 8 (eight) hours as needed for severe pain. 02/07/17   Nadara Mustarduda, Marcus V, MD     Physical Exam: Vitals:   08/15/17 2006 08/15/17 2007 08/16/17 0420 08/16/17 0800  BP:  119/84 122/62 117/89  Pulse:  (!) 110 98 63  Resp:  16 16 16   Temp:  99 F (37.2 C) 99 F (37.2 C)   TempSrc:  Oral Oral   SpO2:  100% 100% 97%  Weight: 111.1 kg (245 lb)     Height: 6' (1.829 m)           Vitals:   08/15/17 2006 08/15/17 2007 08/16/17 0420 08/16/17 0800  BP:  119/84 122/62 117/89  Pulse:  (!) 110 98 63  Resp:  16 16 16   Temp:  99 F (37.2 C) 99 F (37.2 C)   TempSrc:  Oral Oral   SpO2:  100% 100% 97%  Weight: 111.1 kg (245 lb)     Height: 6' (1.829 m)      Constitutional: NAD, calm, comfortable Eyes: PERRL, lids and conjunctivae normal ENMT: Mucous membranes are moist. Posterior pharynx clear of any exudate or lesions.Normal dentition.  Neck: normal, supple, no masses, no thyromegaly Respiratory: clear to auscultation bilaterally, no wheezing, no crackles. Normal respiratory effort. No accessory muscle use.  Cardiovascular: Regular rate and rhythm, no murmurs / rubs / gallops. No extremity edema. 2+ pedal pulses. No carotid bruits.  Abdomen: Diffuse tenderness, no masses palpated, no rebound or guarding. No hepatosplenomegaly. Bowel sounds positive.  Musculoskeletal: no clubbing / cyanosis. No joint deformity upper and lower extremities. Good ROM, no contractures. Normal muscle tone.  Skin: no rashes, lesions, ulcers. No induration Neurologic: Noted to have mild ataxia, no pronator drift CN 2-12 grossly intact. Sensation intact, DTR  normal. Strength 5/5 in all 4.  Psychiatric: Normal judgment and insight. Alert and oriented x 3. Normal mood.     Labs on Admission: I have personally reviewed following labs and imaging studies  CBC: Recent Labs  Lab 08/15/17 2025  WBC 9.9  HGB 12.7*  HCT 38.0*  MCV 69.7*  PLT 178    Basic Metabolic Panel: Recent Labs  Lab 08/15/17 2025  NA 140  K 3.4*  CL 103  CO2 23  GLUCOSE 109*  BUN 11  CREATININE 1.37*  CALCIUM 8.9    GFR: Estimated Creatinine Clearance: 77.5 mL/min (A) (by C-G formula based on SCr of 1.37 mg/dL (H)).  Liver Function Tests: Recent Labs  Lab 08/15/17 2025  AST 803*  ALT 141*  ALKPHOS 46  BILITOT 1.2  PROT 7.0  ALBUMIN 4.0   Recent Labs  Lab 08/16/17 0457  LIPASE 19   No results for input(s): AMMONIA in the last 168 hours.  Coagulation Profile: No results for input(s): INR, PROTIME in the last 168 hours. No results for input(s): DDIMER in the last 72 hours.  Cardiac Enzymes: Recent Labs  Lab 08/16/17 0521  CKTOTAL 48,703*    BNP (last 3 results) No results for input(s): PROBNP in the last 8760 hours.  HbA1C: No results for input(s): HGBA1C in the last 72 hours. No results found for: HGBA1C   CBG: No results for input(s): GLUCAP in the last 168 hours.  Lipid Profile: No results for input(s): CHOL, HDL, LDLCALC, TRIG, CHOLHDL, LDLDIRECT in the last 72 hours.  Thyroid Function Tests: No results for input(s): TSH, T4TOTAL, FREET4, T3FREE, THYROIDAB in the last 72 hours.  Anemia Panel: No results for input(s): VITAMINB12, FOLATE, FERRITIN, TIBC, IRON, RETICCTPCT in the last 72 hours.  Urine analysis:    Component Value Date/Time   COLORURINE YELLOW 08/15/2017 2026   APPEARANCEUR HAZY (A) 08/15/2017 2026   LABSPEC 1.020 08/15/2017 2026   PHURINE 5.0 08/15/2017 2026   GLUCOSEU NEGATIVE 08/15/2017 2026   HGBUR LARGE (A) 08/15/2017 2026   BILIRUBINUR NEGATIVE 08/15/2017 2026   KETONESUR 20 (A) 08/15/2017 2026    PROTEINUR 100 (A) 08/15/2017 2026  NITRITE NEGATIVE 08/15/2017 2026   LEUKOCYTESUR NEGATIVE 08/15/2017 2026    Sepsis Labs: @LABRCNTIP (procalcitonin:4,lacticidven:4) )No results found for this or any previous visit (from the past 240 hour(s)).       Radiological Exams on Admission: Ct Abdomen Pelvis W Contrast  Result Date: 08/16/2017 CLINICAL DATA:  Abdominal pain.  Dark brown urine. EXAM: CT ABDOMEN AND PELVIS WITH CONTRAST TECHNIQUE: Multidetector CT imaging of the abdomen and pelvis was performed using the standard protocol following bolus administration of intravenous contrast. CONTRAST:  ISOVUE-300 IOPAMIDOL (ISOVUE-300) INJECTION 61% COMPARISON:  None. FINDINGS: Lower chest: No basilar pulmonary nodules or pleural effusion. No apical pericardial effusion. Hepatobiliary: Normal hepatic contours and density. No visible biliary dilatation. Normal gallbladder. Pancreas: Normal parenchymal contours without ductal dilatation. No peripancreatic fluid collection. Spleen: Normal. Adrenals/Urinary Tract: --Adrenal glands: Normal. --Right kidney/ureter: No hydronephrosis, perinephric stranding or nephrolithiasis. No obstructing ureteral stones. --Left kidney/ureter: No hydronephrosis, perinephric stranding or nephrolithiasis. No obstructing ureteral stones. --Urinary bladder: Normal appearance for the degree of distention. Stomach/Bowel: --Stomach/Duodenum: No hiatal hernia or other gastric abnormality. Normal duodenal course. --Small bowel: No dilatation or inflammation. --Colon: No focal abnormality. --Appendix: Normal. Vascular/Lymphatic: Minimal aortic atherosclerotic calcification. No abdominal or pelvic lymphadenopathy. Reproductive: Normal prostate and seminal vesicles. Musculoskeletal. No bony spinal canal stenosis or focal osseous abnormality. Other: None. IMPRESSION: No acute abdominal or pelvic abnormality.  Normal appendix. Electronically Signed   By: Deatra Robinson M.D.   On:  08/16/2017 06:39   US Abdomen Limited Ruq  Result Date: 08/16/2017 CLINICAL DATA:  Elevated liver function tests EXAM: ULTRASOUND ABDOMEN LIMITED RIGHT UPPER QUADRANT COMPARISON:  Abdominal CT 08/16/2017 FINDINGS: Gallbladder: 4 mm nonmobile, nonshadowing echogenic structure along the wall consistent with polyp. No visualized stone. No wall thickening or focal tenderness. Common bile duct: Diameter: 4 mm Liver: No focal lesion identified. Within normal limits in parenchymal echogenicity. Portal vein is patent on color Doppler imaging with normal direction of blood flow towards the liver. IMPRESSION: 1. 4 mm gallbladder polyp. 2. Otherwise normal. Electronically Signed   By: Marnee Spring M.D.   On: 08/16/2017 08:29   Ct Abdomen Pelvis W Contrast  Result Date: 08/16/2017 CLINICAL DATA:  Abdominal pain.  Dark brown urine. EXAM: CT ABDOMEN AND PELVIS WITH CONTRAST TECHNIQUE: Multidetector CT imaging of the abdomen and pelvis was performed using the standard protocol following bolus administration of intravenous contrast. CONTRAST:  ISOVUE-300 IOPAMIDOL (ISOVUE-300) INJECTION 61% COMPARISON:  None. FINDINGS: Lower chest: No basilar pulmonary nodules or pleural effusion. No apical pericardial effusion. Hepatobiliary: Normal hepatic contours and density. No visible biliary dilatation. Normal gallbladder. Pancreas: Normal parenchymal contours without ductal dilatation. No peripancreatic fluid collection. Spleen: Normal. Adrenals/Urinary Tract: --Adrenal glands: Normal. --Right kidney/ureter: No hydronephrosis, perinephric stranding or nephrolithiasis. No obstructing ureteral stones. --Left kidney/ureter: No hydronephrosis, perinephric stranding or nephrolithiasis. No obstructing ureteral stones. --Urinary bladder: Normal appearance for the degree of distention. Stomach/Bowel: --Stomach/Duodenum: No hiatal hernia or other gastric abnormality. Normal duodenal course. --Small bowel: No dilatation or  inflammation. --Colon: No focal abnormality. --Appendix: Normal. Vascular/Lymphatic: Minimal aortic atherosclerotic calcification. No abdominal or pelvic lymphadenopathy. Reproductive: Normal prostate and seminal vesicles. Musculoskeletal. No bony spinal canal stenosis or focal osseous abnormality. Other: None. IMPRESSION: No acute abdominal or pelvic abnormality.  Normal appendix. Electronically Signed   By: Deatra Robinson M.D.   On: 08/16/2017 06:39   Xr Ankle Complete Right  Result Date: 07/18/2017 3 view radiographs of the right ankle shows stable subtalar and tibiotalar fusion.  There is some  mild fibrous union of the ankle joint.  Midfoot has no degenerative changes.  No hardware failure.  No lucency around the hardware.  US Abdomen Limited Ruq  Result Date: 08/16/2017 CLINICAL DATA:  Elevated liver function tests EXAM: ULTRASOUND ABDOMEN LIMITED RIGHT UPPER QUADRANT COMPARISON:  Abdominal CT 08/16/2017 FINDINGS: Gallbladder: 4 mm nonmobile, nonshadowing echogenic structure along the wall consistent with polyp. No visualized stone. No wall thickening or focal tenderness. Common bile duct: Diameter: 4 mm Liver: No focal lesion identified. Within normal limits in parenchymal echogenicity. Portal vein is patent on color Doppler imaging with normal direction of blood flow towards the liver. IMPRESSION: 1. 4 mm gallbladder polyp. 2. Otherwise normal. Electronically Signed   By: Marnee Spring M.D.   On: 08/16/2017 08:29      EKG:  Pending  Assessment/Plan Principal Problem:   Rhabdomyolysis Active Problems:   Atrial fibrillation, chronic (HCC)   Essential hypertension   Cocaine use   Chronic musculoskeletal pain   Depression with anxiety   Bipolar disorder (HCC)   Schizophrenia (HCC)     Rhabdomyolysis Likely the setting of ongoing alcohol Patient is being hydrated with IV fluids, including normal saline with sodium bicarbonate We'll check serial CK Monitor renal function  closely  Alcohol dependence, transaminitis AST greater than ALT likely the setting of alcohol dependence, rhabdo Patient has been started on CIWA protocol  Remote history of TBI and mild residual left-sided deficits   On multiple psychotropic medications at home  Atrial fibrillation, chronic Currently rate controlled Continue diltiazem and Eliquis  Hyperlipidemia Hold atorvastatin 10 mg in the setting of rhabdo   Bipolar disorder and Schizophrenia Hx of remote traumatic brain injuryand mild left-sided weakness from this -Mostly independent however requires assistance with executive function Continue  risperidone and  Seroquel    Acute on chronic Low back pain Added solumedrol and robaxin for his low back pain Defer using narcotics as UDS positive for cocaine Solumedrol should also help  Bring down CK levels       DVT prophylaxis:   eliquis      Code Status Orders full code   (From admission, onward)      consults called:  Family Communication: Admission, patients condition and plan of care including tests being ordered have been discussed with the patient  who indicates understanding and agree with the plan and Code Status  Admission status: inpatient    Disposition plan: Further plan will depend as patient's clinical course evolves and further radiologic and laboratory data become available. Likely home when stable   At the time of admission, it appears that the appropriate admission status for this patient is INPATIENT .Thisis judged to be reasonable and necessary in order to provide the required intensity of service to ensure the patient's safetygiven thepresenting symptoms, physical exam findings, and initial radiographic and laboratory data in the context of their chronic comorbidities.   Richarda Overlie MD [facility] Pager [phone]  If 7PM-7AM, please contact night-coverage [email or web address] Password [facility]  08/16/2017, 9:23 AM

## 2017-08-16 NOTE — ED Notes (Signed)
Pt back from Ultrasound

## 2017-08-16 NOTE — ED Notes (Signed)
Patient states his last drink was Monday or Tuesday, he normally drinks a "big glass" of beer a day, sometimes more, and usually drinks everyday, but not the last 2-3 days. Denies any hallucinations, no N/V, no tremor. Patient resting comfortably in bed at this time.

## 2017-08-16 NOTE — ED Provider Notes (Signed)
Patient discussed with and care assumed from Dr. Jessica PriestBangor a t08:00 a.m. She has history of alcoholism.  Also cocaine abuse.  Last cocaine use 4 days ago.  He is on atorvastatin and has been for over one year.  He has had left flank pain and pain with movement for several days and dark urine.  CT of his abdomen, and ultrasound of his abdomen did not show acute process.  He has heme positive urine but acellular.  CPK elevated at 48,000.  This all consistent with rhabdomyolysis.  On exam he is tender in the paraspinal musculature.  No visible bruising.  No abnormalities noted at this area on CT scan to suggest she had us.  It is muscular in origin and painful with movement.  This is likely secondary again to his statin use.  He has been given 1 L of fluid through his workup over the night by Dr. Manus Gunningancour.  I have ordered another liter bolus of fluid, and an infusion of D5 plus bicarb.  Placed a call [facility] regarding [facility].   Rolland PorterJames, Damonie Furney, MD 08/16/17 [phone]

## 2017-08-16 NOTE — ED Notes (Signed)
Patient transported to CT 

## 2017-08-17 DIAGNOSIS — F310 Bipolar disorder, current episode hypomanic: Secondary | ICD-10-CM

## 2017-08-17 DIAGNOSIS — F1490 Cocaine use, unspecified, uncomplicated: Secondary | ICD-10-CM

## 2017-08-17 DIAGNOSIS — M6282 Rhabdomyolysis: Principal | ICD-10-CM

## 2017-08-17 DIAGNOSIS — I482 Chronic atrial fibrillation: Secondary | ICD-10-CM

## 2017-08-17 LAB — PROTIME-INR
INR: 1.26
Prothrombin Time: 15.7 seconds — ABNORMAL HIGH (ref 11.4–15.2)

## 2017-08-17 LAB — GLUCOSE, CAPILLARY
GLUCOSE-CAPILLARY: 123 mg/dL — AB (ref 65–99)
GLUCOSE-CAPILLARY: 147 mg/dL — AB (ref 65–99)
Glucose-Capillary: 145 mg/dL — ABNORMAL HIGH (ref 65–99)

## 2017-08-17 LAB — HEPATITIS PANEL, ACUTE
HEP A IGM: NEGATIVE
Hep B C IgM: NEGATIVE
Hepatitis B Surface Ag: NEGATIVE

## 2017-08-17 LAB — COMPREHENSIVE METABOLIC PANEL
ALBUMIN: 3.1 g/dL — AB (ref 3.5–5.0)
ALT: 116 U/L — ABNORMAL HIGH (ref 17–63)
AST: 432 U/L — AB (ref 15–41)
Alkaline Phosphatase: 37 U/L — ABNORMAL LOW (ref 38–126)
Anion gap: 9 (ref 5–15)
BILIRUBIN TOTAL: 0.9 mg/dL (ref 0.3–1.2)
BUN: 6 mg/dL (ref 6–20)
CO2: 25 mmol/L (ref 22–32)
CREATININE: 1.04 mg/dL (ref 0.61–1.24)
Calcium: 8.4 mg/dL — ABNORMAL LOW (ref 8.9–10.3)
Chloride: 105 mmol/L (ref 101–111)
GFR calc Af Amer: >60 mL/min (ref >60)
GLUCOSE: 123 mg/dL — AB (ref 65–99)
POTASSIUM: 4.8 mmol/L (ref 3.5–5.1)
Sodium: 139 mmol/L (ref 135–145)
TOTAL PROTEIN: 6.3 g/dL — AB (ref 6.5–8.1)

## 2017-08-17 LAB — CBC
HEMATOCRIT: 35 % — AB (ref 39.0–52.0)
Hemoglobin: 11.4 g/dL — ABNORMAL LOW (ref 13.0–17.0)
MCH: 23 pg — ABNORMAL LOW (ref 26.0–34.0)
MCHC: 32.6 g/dL (ref 30.0–36.0)
MCV: 70.7 fL — AB (ref 78.0–100.0)
PLATELETS: 149 10*3/uL — AB (ref 150–400)
RBC: 4.95 MIL/uL (ref 4.22–5.81)
RDW: 14.8 % (ref 11.5–15.5)
WBC: 7.4 10*3/uL (ref 4.0–10.5)

## 2017-08-17 LAB — CK
Total CK: 28514 U/L — ABNORMAL HIGH (ref 49–397)
Total CK: 15505 U/L — ABNORMAL HIGH (ref 49–397)

## 2017-08-17 LAB — HIV ANTIBODY (ROUTINE TESTING W REFLEX): HIV SCREEN 4TH GENERATION: NONREACTIVE

## 2017-08-17 NOTE — Progress Notes (Signed)
PROGRESS NOTE    Garrett Ferrell  ZOX:[id number] DOB: Nov 29, 1960 DOA: 08/16/2017 PCP: Jackie Plum, MD   Brief Narrative: 57 year old male with history of chronic atrial fibrillation on Eliquis, hypertension, traumatic brain injury, schizophrenia/bipolar, anxiety depression presented with lower back pain and dark urine.  Patient has a history of heavy alcohol use and has been drinking vodka.  Urine toxicology with cocaine and benzo positive.  Found to have rhabdomyolysis with CK of 48,000.  Elevated liver enzymes.  Assessment & Plan:   #Nontraumatic rhabdomyolysis: Continue IV fluid.  CK level trending down to 20,000.  Renal function normal.  Alcohol abuse and watch for withdrawal: Continue thiamine, folic acid, Ativan as needed and CIWA protocol.  Transaminitis could be due to alcohol use and may be due to rhabdomyolysis: Liver enzymes trending down.  Monitor lab.  Chronic atrial fibrillation: Continue diltiazem and Eliquis.  Monitor heart rate.  Hyperlipidemia: Statin on hold because of bradycardia.  Bipolar mood disorder and schizophrenia: Patient with history of traumatic brain injury.  Continue supportive care.  On Risperdal and Seroquel.  Acute on chronic back pain in the setting of rhabdomyolysis.  Continue current pain management.  Essential hypertension: Monitor blood pressure.   DVT prophylaxis: Eliquis Code Status: Full code Family Communication: Patient's wife sleeping at bedside Disposition Plan: Likely home in 1-2 days    Consultants:   None  Procedures: None Antimicrobials: None  Subjective: Seen and examined at bedside.  Reported continued to have back pain.  Denied nausea vomiting chest pain shortness of breath. Objective: Vitals:   08/16/17 2019 08/17/17 0609 08/17/17 0949 08/17/17 0953  BP: 117/78 124/87 (!) 89/57 (!) 89/57  Pulse: 84 91  80  Resp: 18 18    Temp: 98 F (36.7 C) 97.7 F (36.5 C)    TempSrc: Oral Oral    SpO2: 97% 93%  97%    Weight: 117.5 kg (259 lb 0.7 oz)     Height: 6' (1.829 m)       Intake/Output Summary (Last 24 hours) at 08/17/2017 1317 Last data filed at 08/17/2017 0953 Gross per 24 hour  Intake 2437.5 ml  Output 1600 ml  Net 837.5 ml   Filed Weights   08/15/17 2006 08/16/17 2019  Weight: 111.1 kg (245 lb) 117.5 kg (259 lb 0.7 oz)    Examination:  General exam: Appears calm and comfortable  Respiratory system: Clear to auscultation. Respiratory effort normal. No wheezing or crackle Cardiovascular system: S1 & S2 heard, RRR.  No pedal edema. Gastrointestinal system: Abdomen is nondistended, soft and nontender. Normal bowel sounds heard. Central nervous system: Alert and oriented. No focal neurological deficits. Extremities: Symmetric 5 x 5 power. Skin: No rashes, lesions or ulcers   Data Reviewed: I have personally reviewed following labs and imaging studies  CBC: Recent Labs  Lab 08/15/17 2025 08/17/17 0544  WBC 9.9 7.4  HGB 12.7* 11.4*  HCT 38.0* 35.0*  MCV 69.7* 70.7*  PLT 178 149*   Basic Metabolic Panel: Recent Labs  Lab 08/15/17 2025 08/17/17 0544  NA 140 139  K 3.4* 4.8  CL 103 105  CO2 23 25  GLUCOSE 109* 123*  BUN 11 6  CREATININE 1.37* 1.04  CALCIUM 8.9 8.4*   GFR: Estimated Creatinine Clearance: 105 mL/min (by C-G formula based on SCr of 1.04 mg/dL). Liver Function Tests: Recent Labs  Lab 08/15/17 2025 08/17/17 0544  AST 803* 432*  ALT 141* 116*  ALKPHOS 46 37*  BILITOT 1.2 0.9  PROT 7.0 6.3*  ALBUMIN 4.0 3.1*   Recent Labs  Lab 08/16/17 0457 08/16/17 0956  LIPASE 19 21   No results for input(s): AMMONIA in the last 168 hours. Coagulation Profile: Recent Labs  Lab 08/17/17 0544  INR 1.26   Cardiac Enzymes: Recent Labs  Lab 08/16/17 0521 08/16/17 0956 08/16/17 2043 08/17/17 0544  CKTOTAL 48,703* 44,530* 39,282* 28,514*   BNP (last 3 results) No results for input(s): PROBNP in the last 8760 hours. HbA1C: No results for input(s):  HGBA1C in the last 72 hours. CBG: Recent Labs  Lab 08/17/17 0006 08/17/17 1201  GLUCAP 145* 123*   Lipid Profile: No results for input(s): CHOL, HDL, LDLCALC, TRIG, CHOLHDL, LDLDIRECT in the last 72 hours. Thyroid Function Tests: Recent Labs    08/16/17 0956  TSH 1.819   Anemia Panel: No results for input(s): VITAMINB12, FOLATE, FERRITIN, TIBC, IRON, RETICCTPCT in the last 72 hours. Sepsis Labs: No results for input(s): PROCALCITON, LATICACIDVEN in the last 168 hours.  No results found for this or any previous visit (from the past 240 hour(s)).       Radiology Studies: Ct Abdomen Pelvis W Contrast  Result Date: 08/16/2017 CLINICAL DATA:  Abdominal pain.  Dark brown urine. EXAM: CT ABDOMEN AND PELVIS WITH CONTRAST TECHNIQUE: Multidetector CT imaging of the abdomen and pelvis was performed using the standard protocol following bolus administration of intravenous contrast. CONTRAST:  ISOVUE-300 IOPAMIDOL (ISOVUE-300) INJECTION 61% COMPARISON:  None. FINDINGS: Lower chest: No basilar pulmonary nodules or pleural effusion. No apical pericardial effusion. Hepatobiliary: Normal hepatic contours and density. No visible biliary dilatation. Normal gallbladder. Pancreas: Normal parenchymal contours without ductal dilatation. No peripancreatic fluid collection. Spleen: Normal. Adrenals/Urinary Tract: --Adrenal glands: Normal. --Right kidney/ureter: No hydronephrosis, perinephric stranding or nephrolithiasis. No obstructing ureteral stones. --Left kidney/ureter: No hydronephrosis, perinephric stranding or nephrolithiasis. No obstructing ureteral stones. --Urinary bladder: Normal appearance for the degree of distention. Stomach/Bowel: --Stomach/Duodenum: No hiatal hernia or other gastric abnormality. Normal duodenal course. --Small bowel: No dilatation or inflammation. --Colon: No focal abnormality. --Appendix: Normal. Vascular/Lymphatic: Minimal aortic atherosclerotic calcification. No  abdominal or pelvic lymphadenopathy. Reproductive: Normal prostate and seminal vesicles. Musculoskeletal. No bony spinal canal stenosis or focal osseous abnormality. Other: None. IMPRESSION: No acute abdominal or pelvic abnormality.  Normal appendix. Electronically Signed   By: Deatra Robinson M.D.   On: 08/16/2017 06:39   US Abdomen Limited Ruq  Result Date: 08/16/2017 CLINICAL DATA:  Elevated liver function tests EXAM: ULTRASOUND ABDOMEN LIMITED RIGHT UPPER QUADRANT COMPARISON:  Abdominal CT 08/16/2017 FINDINGS: Gallbladder: 4 mm nonmobile, nonshadowing echogenic structure along the wall consistent with polyp. No visualized stone. No wall thickening or focal tenderness. Common bile duct: Diameter: 4 mm Liver: No focal lesion identified. Within normal limits in parenchymal echogenicity. Portal vein is patent on color Doppler imaging with normal direction of blood flow towards the liver. IMPRESSION: 1. 4 mm gallbladder polyp. 2. Otherwise normal. Electronically Signed   By: Marnee Spring M.D.   On: 08/16/2017 08:29        Scheduled Meds: . allopurinol  100 mg Oral Daily  . apixaban  5 mg Oral BID  . diltiazem  240 mg Oral Daily  . ferrous sulfate  325 mg Oral Q breakfast  . folic acid  1 mg Oral Daily  . methocarbamol  500 mg Oral TID  . multivitamin with minerals  1 tablet Oral Daily  . QUEtiapine Fumarate  150 mg Oral QHS  . risperiDONE  1 mg Oral Daily   And  .  risperiDONE  2 mg Oral QHS  . thiamine  100 mg Oral Daily   Or  . thiamine  100 mg Intravenous Daily  . trazodone  300 mg Oral QHS   Continuous Infusions: . 0.9 % NaCl with KCl 20 mEq / L Stopped (08/17/17 0949)     LOS: 1 day    Dron Jaynie CollinsPrasad Bhandari, MD [facility] Pager [phone]  If 7PM-7AM, please contact night-coverage [email or web address] Password [email or web address] 08/17/2017, 1:17 PM

## 2017-08-17 NOTE — Progress Notes (Signed)
Patient arrived to the unit via bed from the emergency department.  Patient is alert and oriented x 4.  Skin assessment complete.  No skin issues.  IV intact to the left antecubital. Educated the patient on how to reach the staff on the unit.  Placed the call light within reach and lowered the bed.  Will continue to monitor the patient and notify as MD as needed.

## 2017-08-18 ENCOUNTER — Inpatient Hospital Stay (HOSPITAL_COMMUNITY): Payer: Medicaid Other

## 2017-08-18 ENCOUNTER — Other Ambulatory Visit: Payer: Self-pay

## 2017-08-18 DIAGNOSIS — M7918 Myalgia, other site: Secondary | ICD-10-CM

## 2017-08-18 DIAGNOSIS — G8929 Other chronic pain: Secondary | ICD-10-CM

## 2017-08-18 LAB — COMPREHENSIVE METABOLIC PANEL
ALT: 94 U/L — AB (ref 17–63)
AST: 245 U/L — AB (ref 15–41)
Albumin: 2.7 g/dL — ABNORMAL LOW (ref 3.5–5.0)
Alkaline Phosphatase: 37 U/L — ABNORMAL LOW (ref 38–126)
Anion gap: 9 (ref 5–15)
BUN: 7 mg/dL (ref 6–20)
CHLORIDE: 106 mmol/L (ref 101–111)
CO2: 26 mmol/L (ref 22–32)
CREATININE: 1.11 mg/dL (ref 0.61–1.24)
Calcium: 8.4 mg/dL — ABNORMAL LOW (ref 8.9–10.3)
Glucose, Bld: 117 mg/dL — ABNORMAL HIGH (ref 65–99)
POTASSIUM: 4.1 mmol/L (ref 3.5–5.1)
SODIUM: 141 mmol/L (ref 135–145)
Total Bilirubin: 0.4 mg/dL (ref 0.3–1.2)
Total Protein: 5.8 g/dL — ABNORMAL LOW (ref 6.5–8.1)

## 2017-08-18 LAB — CK: CK TOTAL: 11662 U/L — AB (ref 49–397)

## 2017-08-18 LAB — GLUCOSE, CAPILLARY
GLUCOSE-CAPILLARY: 98 mg/dL (ref 65–99)
GLUCOSE-CAPILLARY: 118 mg/dL — AB (ref 65–99)
Glucose-Capillary: 91 mg/dL (ref 65–99)

## 2017-08-18 MED ORDER — HYDROMORPHONE HCL 1 MG/ML IJ SOLN
1.0000 mg | INTRAMUSCULAR | Status: DC | PRN
  Administered 2017-08-18 – 2017-08-19 (×2): 1 mg via INTRAVENOUS
  Filled 2017-08-18 (×2): qty 1

## 2017-08-18 MED ORDER — METHOCARBAMOL 750 MG PO TABS
750.0000 mg | ORAL_TABLET | Freq: Three times a day (TID) | ORAL | Status: DC
  Administered 2017-08-18 – 2017-08-20 (×6): 750 mg via ORAL
  Filled 2017-08-18 (×6): qty 1

## 2017-08-18 MED ORDER — OXYCODONE HCL 5 MG PO TABS
7.5000 mg | ORAL_TABLET | Freq: Four times a day (QID) | ORAL | Status: DC | PRN
  Administered 2017-08-18 – 2017-08-19 (×4): 7.5 mg via ORAL
  Filled 2017-08-18 (×4): qty 2

## 2017-08-18 NOTE — Progress Notes (Signed)
PROGRESS NOTE    Garrett Ferrell  ZOX:[id number] DOB: Oct 07, 1960 DOA: 08/16/2017 PCP: Jackie Plum, MD   Brief Narrative: 57 year old male with history of chronic atrial fibrillation on Eliquis, hypertension, traumatic brain injury, schizophrenia/bipolar, anxiety depression presented with lower back pain and dark urine.  Patient has a history of heavy alcohol use and has been drinking vodka.  Urine toxicology with cocaine and benzo positive.  Found to have rhabdomyolysis with CK of 48,000.  Elevated liver enzymes. 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  Assessment & Plan:   #Nontraumatic rhabdomyolysis:  CK level trending down to 11,662.  Renal function normal.  Continue IV fluid and repeat lab in the morning.  #Alcohol abuse and watch for withdrawal: Continue thiamine, folic acid, Ativan as needed and CIWA protocol.  No sign of withdrawal  #Transaminitis could be due to alcohol use and may be due to rhabdomyolysis: Liver enzymes trending down.  Monitor lab.  #Chronic atrial fibrillation: Continue diltiazem and Eliquis.  Monitor heart rate.  Noted elevated heart rate last night.  Currently under control.  Continue diltiazem.  He did not receive diltiazem yesterday because of hypotension.  #Hyperlipidemia: Statin on hold because of bradycardia.  #Bipolar mood disorder and schizophrenia: Patient with history of traumatic brain injury.  Continue supportive care.  On Risperdal and Seroquel.  #Acute on chronic back pain in the setting of rhabdomyolysis.  Continue current pain management.  #Essential hypertension: Monitor blood pressure.   DVT prophylaxis: Eliquis Code Status: Full code Family Communication: No family at bedside Disposition Plan: Likely home in 1-2 days    Consultants:   None  Procedures: None Antimicrobials: None  Subjective: Seen and examined at bedside.  Having back muscle sore.  Denies headache, dizziness, nausea vomiting.   Objective: Vitals:   08/17/17 0953 08/17/17 1351 08/17/17 2100 08/18/17 0500  BP: (!) 89/57 91/64 109/70 106/77  Pulse: 80 67 84 93  Resp:  16 18 18   Temp:  97.7 F (36.5 C) 98 F (36.7 C) 97.8 F (36.6 C)  TempSrc:  Oral Oral Oral  SpO2: 97% 97% 99% 96%  Weight:      Height:        Intake/Output Summary (Last 24 hours) at 08/18/2017 1140 Last data filed at 08/18/2017 0900 Gross per 24 hour  Intake 1877.92 ml  Output 1975 ml  Net -97.08 ml   Filed Weights   08/15/17 2006 08/16/17 2019  Weight: 111.1 kg (245 lb) 117.5 kg (259 lb 0.7 oz)    Examination:  General exam: Not in distress Respiratory system: Clear bilateral, respiratory for normal Cardiovascular system: Regular rate rhythm S1-S2 normal.  No pedal edema.. Gastrointestinal system: Abdomen is nondistended, soft and nontender. Normal bowel sounds heard. Central nervous system: Alert awake and following commands. Extremities: Symmetric 5 x 5 power. Skin: No rashes, lesions or ulcers   Data Reviewed: I have personally reviewed following labs and imaging studies  CBC: Recent Labs  Lab 08/15/17 2025 08/17/17 0544  WBC 9.9 7.4  HGB 12.7* 11.4*  HCT 38.0* 35.0*  MCV 69.7* 70.7*  PLT 178 149*   Basic Metabolic Panel: Recent Labs  Lab 08/15/17 2025 08/17/17 0544 08/18/17 0432  NA 140 139 141  K 3.4* 4.8 4.1  CL 103 105 106  CO2 23 25 26   GLUCOSE 109* 123* 117*  BUN 11 6 7   CREATININE 1.37* 1.04 1.11  CALCIUM 8.9 8.4* 8.4*   GFR: Estimated Creatinine Clearance: 98.4 mL/min (by C-G formula based  on SCr of 1.11 mg/dL). Liver Function Tests: Recent Labs  Lab 08/15/17 2025 08/17/17 0544 08/18/17 0432  AST 803* 432* 245*  ALT 141* 116* 94*  ALKPHOS 46 37* 37*  BILITOT 1.2 0.9 0.4  PROT 7.0 6.3* 5.8*  ALBUMIN 4.0 3.1* 2.7*   Recent Labs  Lab 08/16/17 0457 08/16/17 0956  LIPASE 19 21   No results for input(s): AMMONIA in the last 168 hours. Coagulation Profile: Recent Labs  Lab  08/17/17 0544  INR 1.26   Cardiac Enzymes: Recent Labs  Lab 08/16/17 0956 08/16/17 2043 08/17/17 0544 08/17/17 2107 08/18/17 0807  CKTOTAL 44,530* 39,282* 28,514* 15,505* 11,662*   BNP (last 3 results) No results for input(s): PROBNP in the last 8760 hours. HbA1C: No results for input(s): HGBA1C in the last 72 hours. CBG: Recent Labs  Lab 08/17/17 0006 08/17/17 1201 08/17/17 1608 08/18/17 0247 08/18/17 0812  GLUCAP 145* 123* 147* 98 91   Lipid Profile: No results for input(s): CHOL, HDL, LDLCALC, TRIG, CHOLHDL, LDLDIRECT in the last 72 hours. Thyroid Function Tests: Recent Labs    08/16/17 0956  TSH 1.819   Anemia Panel: No results for input(s): VITAMINB12, FOLATE, FERRITIN, TIBC, IRON, RETICCTPCT in the last 72 hours. Sepsis Labs: No results for input(s): PROCALCITON, LATICACIDVEN in the last 168 hours.  No results found for this or any previous visit (from the past 240 hour(s)).       Radiology Studies: No results found.      Scheduled Meds: . allopurinol  100 mg Oral Daily  . apixaban  5 mg Oral BID  . diltiazem  240 mg Oral Daily  . ferrous sulfate  325 mg Oral Q breakfast  . folic acid  1 mg Oral Daily  . methocarbamol  500 mg Oral TID  . multivitamin with minerals  1 tablet Oral Daily  . QUEtiapine Fumarate  150 mg Oral QHS  . risperiDONE  1 mg Oral Daily   And  . risperiDONE  2 mg Oral QHS  . thiamine  100 mg Oral Daily   Or  . thiamine  100 mg Intravenous Daily  . trazodone  300 mg Oral QHS   Continuous Infusions: . 0.9 % NaCl with KCl 20 mEq / L 125 mL/hr at 08/18/17 0521     LOS: 2 days    Kay Ricciuti Jaynie CollinsPrasad Thursa Emme, MD [facility] Pager [phone]  If 7PM-7AM, please contact night-coverage [email or web address] Password [email or web address] 08/18/2017, 11:40 AM

## 2017-08-18 NOTE — Progress Notes (Signed)
RN informed by tele command patient HR @ 160's in afib for a 30 sec run then trended back to 80-100's . vitals taken 102/88 HR 88 T 98.0 . Patient denies cardaic discomfort. States he just got up and went to the bathroom. Above finding texed to on cal [facility] via amion

## 2017-08-18 NOTE — Plan of Care (Signed)
  Progressing Education: Knowledge of General Education information will improve 08/18/2017 1153 - Progressing by Jezabelle Chisolm, Arline AspMeredith P, RN Health Behavior/Discharge Planning: Ability to manage health-related needs will improve 08/18/2017 1153 - Progressing by Ayn Domangue, Arline AspMeredith P, RN Clinical Measurements: Ability to maintain clinical measurements within normal limits will improve 08/18/2017 1153 - Progressing by Kay Ricciuti, Arline AspMeredith P, RN Will remain free from infection 08/18/2017 1153 - Progressing by Andy Gaussunzweiler, Cameren Odwyer P, RN Diagnostic test results will improve 08/18/2017 1153 - Progressing by Andy Gaussunzweiler, Marshal Eskew P, RN Respiratory complications will improve 08/18/2017 1153 - Progressing by Andy Gaussunzweiler, Daksha Koone P, RN Cardiovascular complication will be avoided 08/18/2017 1153 - Progressing by Andy Gaussunzweiler, Avaiyah Strubel P, RN Activity: Risk for activity intolerance will decrease 08/18/2017 1153 - Progressing by Andy Gaussunzweiler, Tremell Reimers P, RN Nutrition: Adequate nutrition will be maintained 08/18/2017 1153 - Progressing by Andy Gaussunzweiler, August Longest P, RN Coping: Level of anxiety will decrease 08/18/2017 1153 - Progressing by Andy Gaussunzweiler, Brieana Shimmin P, RN Elimination: Will not experience complications related to bowel motility 08/18/2017 1153 - Progressing by Andy Gaussunzweiler, Primo Innis P, RN Will not experience complications related to urinary retention 08/18/2017 1153 - Progressing by Macaiah Mangal, Arline AspMeredith P, RN Pain Managment: General experience of comfort will improve 08/18/2017 1153 - Progressing by Andy Gaussunzweiler, Ian Castagna P, RN Safety: Ability to remain free from injury will improve 08/18/2017 1153 - Progressing by Andy Gaussunzweiler, Kynsley Whitehouse P, RN Skin Integrity: Risk for impaired skin integrity will decrease 08/18/2017 1153 - Progressing by Khia Dieterich, Arline AspMeredith P, RN

## 2017-08-18 NOTE — Progress Notes (Signed)
RN informed by tele command that patient had a 30 second afib rvr rate @147 . Vitals taken and found to be with in normal limits 105/62 HR 84 T 98.1 . Patient asymptomatic for cardiac discomfort or distress. Above finding reported to on call t [facility] via amion

## 2017-08-19 ENCOUNTER — Inpatient Hospital Stay (HOSPITAL_COMMUNITY): Payer: Medicaid Other

## 2017-08-19 ENCOUNTER — Encounter (HOSPITAL_COMMUNITY): Payer: Self-pay

## 2017-08-19 DIAGNOSIS — M545 Low back pain: Secondary | ICD-10-CM

## 2017-08-19 DIAGNOSIS — M549 Dorsalgia, unspecified: Secondary | ICD-10-CM

## 2017-08-19 LAB — COMPREHENSIVE METABOLIC PANEL
ALT: 97 U/L — ABNORMAL HIGH (ref 17–63)
AST: 205 U/L — ABNORMAL HIGH (ref 15–41)
Albumin: 3 g/dL — ABNORMAL LOW (ref 3.5–5.0)
Alkaline Phosphatase: 39 U/L (ref 38–126)
Anion gap: 7 (ref 5–15)
BUN: 6 mg/dL (ref 6–20)
CHLORIDE: 105 mmol/L (ref 101–111)
CO2: 27 mmol/L (ref 22–32)
Calcium: 8.4 mg/dL — ABNORMAL LOW (ref 8.9–10.3)
Creatinine, Ser: 1.01 mg/dL (ref 0.61–1.24)
Glucose, Bld: 93 mg/dL (ref 65–99)
POTASSIUM: 3.7 mmol/L (ref 3.5–5.1)
Sodium: 139 mmol/L (ref 135–145)
Total Bilirubin: 0.3 mg/dL (ref 0.3–1.2)
Total Protein: 6 g/dL — ABNORMAL LOW (ref 6.5–8.1)

## 2017-08-19 LAB — GLUCOSE, CAPILLARY
GLUCOSE-CAPILLARY: 104 mg/dL — AB (ref 65–99)
GLUCOSE-CAPILLARY: 93 mg/dL (ref 65–99)
Glucose-Capillary: 85 mg/dL (ref 65–99)

## 2017-08-19 LAB — CK: CK TOTAL: 7013 U/L — AB (ref 49–397)

## 2017-08-19 MED ORDER — GABAPENTIN 300 MG PO CAPS
300.0000 mg | ORAL_CAPSULE | Freq: Three times a day (TID) | ORAL | Status: DC
  Administered 2017-08-19 – 2017-08-20 (×5): 300 mg via ORAL
  Filled 2017-08-19 (×5): qty 1

## 2017-08-19 MED ORDER — GABAPENTIN 600 MG PO TABS: 300.0000 mg | ORAL_TABLET | Freq: Three times a day (TID) | ORAL | Status: DC

## 2017-08-19 NOTE — Discharge Instructions (Signed)

## 2017-08-19 NOTE — Plan of Care (Signed)
Progressing

## 2017-08-19 NOTE — Progress Notes (Signed)
PROGRESS NOTE    Garrett Ferrell  ZOX:[id number] DOB: 20-Mar-1961 DOA: 08/16/2017 PCP: Jackie Plum, MD   Brief Narrative: 57 year old male with history of chronic atrial fibrillation on Eliquis, hypertension, traumatic brain injury, schizophrenia/bipolar, anxiety depression presented with lower back pain and dark urine.  Patient has a history of heavy alcohol use and has been drinking vodka.  Urine toxicology with cocaine and benzo positive.  Found to have rhabdomyolysis with CK of 48,000.  Elevated liver enzymes. 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  Assessment & Plan:   #Nontraumatic rhabdomyolysis:  CK level trending down to 7013.  Renal function normal.  I reduce the rate of IV fluid.  Monitor lab. #Alcohol abuse and watch for withdrawal: Continue thiamine, folic acid, Ativan as needed and CIWA protocol.  No sign of withdrawal  #Transaminitis  due to alcohol use and  rhabdomyolysis: Liver enzymes trending down.  Monitor lab.  #Chronic atrial fibrillation: Continue diltiazem and Eliquis.  Monitor heart rate. Continue diltiazem.  #Pain management: Patient reported back and hip pain mostly in the left hip: X-ray consistent with osteoarthritis and chronic finding.  Also with rhabdomyolysis.  Currently on Robaxin, oxycodone.  Still requiring IV Dilaudid as needed.  Added Neurontin today.  PT OT ordered.  Plan to wean pain medicine by tomorrow and possibly discharge home.  Patient verbalized understanding.  Case manager referred to.  #Hyperlipidemia: Statin on hold because of transaminitis.  #Bipolar mood disorder and schizophrenia: Patient with history of traumatic brain injury.  Continue supportive care.  On Risperdal and Seroquel.  #Essential hypertension: Monitor blood pressure.   DVT prophylaxis: Eliquis Code Status: Full code Family Communication: No family at bedside Disposition Plan: Likely home in 1-2 days    Consultants:   None  Procedures:  None Antimicrobials: None  Subjective: Seen and examined at bedside.  Reporting lower back pain and hip pain.  Denied nausea vomiting chest pain shortness of breath.  No problem with urinary or bowel movement.    Objective: Vitals:   08/18/17 0500 08/18/17 1431 08/18/17 2100 08/19/17 0500  BP: 106/77 99/62 128/90 126/90  Pulse: 93 67 86 95  Resp: 18 16 18 18   Temp: 97.8 F (36.6 C) 97.8 F (36.6 C) 97.9 F (36.6 C) 97.7 F (36.5 C)  TempSrc: Oral Oral Oral Oral  SpO2: 96% 97% 94% 95%  Weight:      Height:        Intake/Output Summary (Last 24 hours) at 08/19/2017 1112 Last data filed at 08/19/2017 0843 Gross per 24 hour  Intake 540 ml  Output 1405 ml  Net -865 ml   Filed Weights   08/15/17 2006 08/16/17 2019  Weight: 111.1 kg (245 lb) 117.5 kg (259 lb 0.7 oz)    Examination:  General exam: Not in distress Respiratory system: Clear bilateral, respiratory for normal Cardiovascular system: Irregular heart rate, S1-S2 normal. Gastrointestinal system: Abdomen soft, nontender.  Bowel sounds positive Central nervous system: Alert awake and following commands Extremities: Symmetric 5/5 in all extremities. Skin: Has tattoos   Data Reviewed: I have personally reviewed following labs and imaging studies  CBC: Recent Labs  Lab 08/15/17 2025 08/17/17 0544  WBC 9.9 7.4  HGB 12.7* 11.4*  HCT 38.0* 35.0*  MCV 69.7* 70.7*  PLT 178 149*   Basic Metabolic Panel: Recent Labs  Lab 08/15/17 2025 08/17/17 0544 08/18/17 0432 08/19/17 0714  NA 140 139 141 139  K 3.4* 4.8 4.1 3.7  CL 103 105 106 105  CO2 23 25 26 27   GLUCOSE 109* 123* 117* 93  BUN 11 6 7 6   CREATININE 1.37* 1.04 1.11 1.01  CALCIUM 8.9 8.4* 8.4* 8.4*   GFR: Estimated Creatinine Clearance: 108.1 mL/min (by C-G formula based on SCr of 1.01 mg/dL). Liver Function Tests: Recent Labs  Lab 08/15/17 2025 08/17/17 0544 08/18/17 0432 08/19/17 0714  AST 803* 432* 245* 205*  ALT 141* 116* 94* 97*    ALKPHOS 46 37* 37* 39  BILITOT 1.2 0.9 0.4 0.3  PROT 7.0 6.3* 5.8* 6.0*  ALBUMIN 4.0 3.1* 2.7* 3.0*   Recent Labs  Lab 08/16/17 0457 08/16/17 0956  LIPASE 19 21   No results for input(s): AMMONIA in the last 168 hours. Coagulation Profile: Recent Labs  Lab 08/17/17 0544  INR 1.26   Cardiac Enzymes: Recent Labs  Lab 08/16/17 2043 08/17/17 0544 08/17/17 2107 08/18/17 0807 08/19/17 0714  CKTOTAL 39,282* 28,514* 15,505* 11,662* 7,013*   BNP (last 3 results) No results for input(s): PROBNP in the last 8760 hours. HbA1C: No results for input(s): HGBA1C in the last 72 hours. CBG: Recent Labs  Lab 08/18/17 0247 08/18/17 0812 08/18/17 1815 08/19/17 0127 08/19/17 0830  GLUCAP 98 91 118* 104* 85   Lipid Profile: No results for input(s): CHOL, HDL, LDLCALC, TRIG, CHOLHDL, LDLDIRECT in the last 72 hours. Thyroid Function Tests: No results for input(s): TSH, T4TOTAL, FREET4, T3FREE, THYROIDAB in the last 72 hours. Anemia Panel: No results for input(s): VITAMINB12, FOLATE, FERRITIN, TIBC, IRON, RETICCTPCT in the last 72 hours. Sepsis Labs: No results for input(s): PROCALCITON, LATICACIDVEN in the last 168 hours.  No results found for this or any previous visit (from the past 240 hour(s)).       Radiology Studies: Dg Thoracic Spine 2 View  Result Date: 08/19/2017 CLINICAL DATA:  Upper and lower back pain today. EXAM: THORACIC SPINE 2 VIEWS COMPARISON:  CXR 10/03/2016 FINDINGS: There is no evidence of thoracic spine fracture. Chronic degenerative endplate spurring is noted off from T8 through T10 with stable mild chronic mild anterior height loss. Alignment is normal. No other significant bone abnormalities are identified. IMPRESSION: No acute osseous abnormality of the thoracic spine. Chronic mild anterior height loss from T8 through T10 with degenerative anterior osteophytes. Electronically Signed   By: Tollie Ethavid  Kwon M.D.   On: 08/19/2017 00:00   Dg Lumbar Spine 2-3  Views  Result Date: 08/19/2017 CLINICAL DATA:  Lumbar back pain. EXAM: LUMBAR SPINE - 2-3 VIEW COMPARISON:  Reformats from abdominal CT 08/16/2017 FINDINGS: The alignment is maintained. Vertebral body heights are normal. There is no listhesis. The posterior elements are intact. Mild endplate spurring with preservation of disc spaces. No fracture. There is facet arthropathy at L4-L5 and L5-S1. Sacroiliac joints are symmetric and normal. IMPRESSION: 1. Mild spondylosis and facet arthropathy in the lumbar spine. 2. No evidence of acute abnormality. Electronically Signed   By: Rubye OaksMelanie  Ehinger M.D.   On: 08/19/2017 00:04   Dg Hip Unilat With Pelvis 2-3 Views Left  Result Date: 08/19/2017 CLINICAL DATA:  Left posterior back pain. EXAM: DG HIP (WITH OR WITHOUT PELVIS) 2-3V LEFT COMPARISON:  None. FINDINGS: There is no evidence of hip fracture or dislocation. There is mild osteoarthritis of the left hip. There is mild osteoarthritis of sacroiliac joints bilaterally. IMPRESSION: 1.  No acute osseous injury of the left hip. 2. Mild osteoarthritis of the left hip. 3. Mild osteoarthritis of bilateral sacroiliac joints. Electronically Signed   By: Elige KoHetal  Patel   On:  08/19/2017 10:42        Scheduled Meds: . allopurinol  100 mg Oral Daily  . apixaban  5 mg Oral BID  . diltiazem  240 mg Oral Daily  . ferrous sulfate  325 mg Oral Q breakfast  . folic acid  1 mg Oral Daily  . gabapentin  300 mg Oral TID  . methocarbamol  750 mg Oral TID  . multivitamin with minerals  1 tablet Oral Daily  . QUEtiapine Fumarate  150 mg Oral QHS  . risperiDONE  1 mg Oral Daily   And  . risperiDONE  2 mg Oral QHS  . thiamine  100 mg Oral Daily   Or  . thiamine  100 mg Intravenous Daily  . trazodone  300 mg Oral QHS   Continuous Infusions: . 0.9 % NaCl with KCl 20 mEq / L 125 mL/hr at 08/19/17 1610     LOS: 3 days    Lanique Gonzalo Jaynie Collins, MD [facility] Pager [phone]  If 7PM-7AM, please contact  night-coverage [email or web address] Password [email or web address] 08/19/2017, 11:12 AM

## 2017-08-19 NOTE — Evaluation (Signed)
Physical Therapy Evaluation Patient Details Name: Garrett Ferrell MRN: [id number] DOB: 01/09/1961 Today's Date: 08/19/2017   History of Present Illness  57yo male complaining of diffuse abdominal pain, L flank pain, and dark urine, also history of heavy alcohol use. Diagnosd with rhabdomyolysis possibly in context of heavy alcohol use. PMH anxiety, OA, A-fib, hx DVT, gout, HTN, reduced vision R eye, hx seizures, hx ankle fusion R, R heart cath, TBI, schizophrenia/bipolar disorder   Clinical Impression   Patient received in bed sleeping but easily woken, pleasant and willing to participate in PT session today. He is modified independent with functional bed mobility, transfers, and requires general S for gait approximately 2380ft limited by pain, very antalgic pattern noted. He reports that besides his pain, he feels generally at his baseline level of function. He will continue to benefit from skilled PT services in the acute setting to assist in progressing mobility and reducing pain; he may also benefit from skilled OP PT services to further address pain and functional deficits moving forward.     Follow Up Recommendations Outpatient PT    Equipment Recommendations  None recommended by PT    Recommendations for Other Services       Precautions / Restrictions Precautions Precautions: None Restrictions Weight Bearing Restrictions: No      Mobility  Bed Mobility Overal bed mobility: Modified Independent                Transfers Overall transfer level: Modified independent Equipment used: None                Ambulation/Gait Ambulation/Gait assistance: Supervision Ambulation Distance (Feet): 80 Feet Assistive device: None Gait Pattern/deviations: Step-through pattern;Antalgic;Decreased stance time - left;Decreased step length - right Gait velocity: reduced    General Gait Details: grossly antalgic pattern with reduced weight bearing on L LE due to pain, otherwise  generally at baseline   Stairs            Wheelchair Mobility    Modified Rankin (Stroke Patients Only)       Balance Overall balance assessment: Independent                                           Pertinent Vitals/Pain Pain Assessment: 0-10 Pain Score: 10-Worst pain ever Pain Location: L hip and back  Pain Descriptors / Indicators: Aching;Sharp;Sore Pain Intervention(s): Limited activity within patient's tolerance;Monitored during session;Patient requesting pain meds-RN notified    Home Living Family/patient expects to be discharged to:: Private residence Living Arrangements: Spouse/significant other;Children Available Help at Discharge: Family Type of Home: House Home Access: Stairs to enter Entrance Stairs-Rails: Right;Left;Can reach both Secretary/administratorntrance Stairs-Number of Steps: 3 Home Layout: One level Home Equipment: Crutches      Prior Function Level of Independence: Needs assistance   Gait / Transfers Assistance Needed: Independent ambulation.  Supervision for mild cognitive deficits  ADL's / Homemaking Assistance Needed: per pt he can do all ADLs        Hand Dominance        Extremity/Trunk Assessment   Upper Extremity Assessment Upper Extremity Assessment: Defer to OT evaluation    Lower Extremity Assessment Lower Extremity Assessment: Overall WFL for tasks assessed    Cervical / Trunk Assessment Cervical / Trunk Assessment: Normal  Communication   Communication: No difficulties  Cognition Arousal/Alertness: Awake/alert Behavior During Therapy: WFL for tasks assessed/performed Overall Cognitive Status:  History of cognitive impairments - at baseline                                 General Comments: mild cognitive/executive deficits at baseline      General Comments      Exercises     Assessment/Plan    PT Assessment Patient needs continued PT services  PT Problem List Decreased mobility;Decreased  activity tolerance;Pain       PT Treatment Interventions DME instruction;Therapeutic activities;Gait training;Therapeutic exercise;Patient/family education;Stair training;Balance training;Functional mobility training;Neuromuscular re-education;Manual techniques    PT Goals (Current goals can be found in the Care Plan section)  Acute Rehab PT Goals Patient Stated Goal: to reduce pain, feel better  PT Goal Formulation: With patient Time For Goal Achievement: 09/02/17 Potential to Achieve Goals: Good    Frequency Min 2X/week   Barriers to discharge        Co-evaluation               AM-PAC PT "6 Clicks" Daily Activity  Outcome Measure Difficulty turning over in bed (including adjusting bedclothes, sheets and blankets)?: None Difficulty moving from lying on back to sitting on the side of the bed? : None Difficulty sitting down on and standing up from a chair with arms (e.g., wheelchair, bedside commode, etc,.)?: None Help needed moving to and from a bed to chair (including a wheelchair)?: A Little Help needed walking in hospital room?: A Little Help needed climbing 3-5 steps with a railing? : A Little 6 Click Score: 21    End of Session   Activity Tolerance: Patient tolerated treatment well Patient left: in bed;with nursing/sitter in room;with call bell/phone within reach;Other (comment)(sitting at [facility] with RN present and attending )   PT Visit Diagnosis: Difficulty in walking, not elsewhere classified (R26.2);Pain Pain - Right/Left: Left Pain - part of body: Hip    Time: 1610-9604 PT Time Calculation (min) (ACUTE ONLY): 14 min   Charges:   PT Evaluation $PT Eval Low Complexity: 1 Low     PT G Codes:        Nedra Hai PT, DPT, CBIS  Supplemental Physical Therapist [facility]   Pager [phone]

## 2017-08-20 DIAGNOSIS — F418 Other specified anxiety disorders: Secondary | ICD-10-CM

## 2017-08-20 LAB — GLUCOSE, CAPILLARY
GLUCOSE-CAPILLARY: 88 mg/dL (ref 65–99)
GLUCOSE-CAPILLARY: 89 mg/dL (ref 65–99)
Glucose-Capillary: 82 mg/dL (ref 65–99)

## 2017-08-20 LAB — CK: CK TOTAL: 2889 U/L — AB (ref 49–397)

## 2017-08-20 MED ORDER — METHOCARBAMOL 750 MG PO TABS: 750.0000 mg | ORAL_TABLET | Freq: Three times a day (TID) | ORAL | 0 refills | Status: AC | PRN

## 2017-08-20 MED ORDER — GABAPENTIN 300 MG PO CAPS: 300.0000 mg | ORAL_CAPSULE | Freq: Two times a day (BID) | ORAL | 0 refills | Status: AC

## 2017-08-20 MED ORDER — ADULT MULTIVITAMIN W/MINERALS CH: 1.0000 | ORAL_TABLET | Freq: Every day | ORAL | 0 refills | Status: AC

## 2017-08-20 NOTE — Progress Notes (Signed)
Occupational Therapy Evaluation Patient Details Name: Garrett Ferrell MRN: [id number] DOB: 07/29/1960 Today's Date: 08/20/2017    History of Present Illness 57yo male complaining of diffuse abdominal pain, L flank pain, and dark urine, also history of heavy alcohol use. Diagnosd with rhabdomyolysis possibly in context of heavy alcohol use. PMH anxiety, OA, A-fib, hx DVT, gout, HTN, reduced vision R eye, hx seizures, hx ankle fusion R, R heart cath, TBI, schizophrenia/bipolar disorder    Clinical Impression   At baseline, pt states he is independent with self care and mobility, drives and his wife assists with medication management. Pt appears close to baseline. No further OT needed.     Follow Up Recommendations  No OT follow up;Supervision - Intermittent    Equipment Recommendations  None recommended by OT    Recommendations for Other Services       Precautions / Restrictions Precautions Precautions: None Restrictions Weight Bearing Restrictions: No      Mobility Bed Mobility Overal bed mobility: Modified Independent                Transfers Overall transfer level: Modified independent Equipment used: None                  Balance Overall balance assessment: Independent                                         ADL either performed or assessed with clinical judgement   ADL                                         General ADL Comments: most likley baseline; recommend shower seat due to unsteady at times; pt delinced(pt drives)     Vision Patient Visual Report: Other (comment)(low vision R eye form injury) Additional Comments: impaired; at baseline     Perception     Praxis      Pertinent Vitals/Pain Pain Assessment: No/denies pain     Hand Dominance Right   Extremity/Trunk Assessment Upper Extremity Assessment Upper Extremity Assessment: Overall WFL for tasks assessed   Lower Extremity Assessment Lower  Extremity Assessment: Defer to PT evaluation   Cervical / Trunk Assessment Cervical / Trunk Assessment: Normal   Communication Communication Communication: No difficulties   Cognition Arousal/Alertness: Lethargic(due to waking pt up) Behavior During Therapy: Flat affect Overall Cognitive Status: History of cognitive impairments - at baseline                                 General Comments: mild cognitive/executive deficits at baseline; no family available to confirm but most likely baseline   General Comments       Exercises     Shoulder Instructions      Home Living Family/patient expects to be discharged to:: Private residence Living Arrangements: Spouse/significant other;Children Available Help at Discharge: Family Type of Home: House Home Access: Stairs to enter Secretary/administratorntrance Stairs-Number of Steps: 3 Entrance Stairs-Rails: Right;Left;Can reach both Home Layout: One level     Bathroom Shower/Tub: Chief Strategy OfficerTub/shower unit   Bathroom Toilet: Standard     Home Equipment: Crutches          Prior Functioning/Environment Level of Independence: Needs assistance  Gait / Transfers Assistance Needed: Independent ambulation.  Supervision for mild cognitive deficits ADL's / Homemaking Assistance Needed: per pt he can do all ADLs; wife assists with medication management            OT Problem List: Decreased strength;Decreased safety awareness      OT Treatment/Interventions:      OT Goals(Current goals can be found in the care plan section) Acute Rehab OT Goals Patient Stated Goal: to go home OT Goal Formulation: All assessment and education complete, DC therapy  OT Frequency:     Barriers to D/C:            Co-evaluation              AM-PAC PT "6 Clicks" Daily Activity     Outcome Measure Help from another person eating meals?: None Help from another person taking care of personal grooming?: None Help from another person toileting, which  includes using toliet, bedpan, or urinal?: None Help from another person bathing (including washing, rinsing, drying)?: None Help from another person to put on and taking off regular upper body clothing?: None Help from another person to put on and taking off regular lower body clothing?: None 6 Click Score: 24   End of Session Nurse Communication: Mobility status  Activity Tolerance: Patient tolerated treatment well Patient left: in bed;with call bell/phone within reach  OT Visit Diagnosis: Unsteadiness on feet (R26.81)                Time: 1040-1050 OT Time Calculation (min): 10 min Charges:  OT General Charges $OT Visit: 1 Visit OT Evaluation $OT Eval Low Complexity: 1 Low G-Codes:     Natividad Schlosser, OT/L  [phone] 08/20/2017  Garrett Ferrell,Garrett Ferrell 08/20/2017, 10:56 AM

## 2017-08-20 NOTE — Care Management Note (Signed)
Case Management Note  Patient Details  Name: Garrett Ferrell MRN: [id number] Date of Birth: 07/26/1960  Subjective/Objective:   Rhabdomyolysis.                 Action/Plan: Transition to home. Referral made for outpatient PT by NCM.   Expected Discharge Date:   08/20/2017            Expected Discharge Plan:  Home/Self Care  In-House Referral:     Discharge planning Services  CM Consult  Post Acute Care Choice:    Choice offered to:   patient  DME Arranged:   N/A DME Agency:   N/A  HH Arranged:   N/A HH Agency:   N/A  Status of Service:  Completed, signed off  If discussed at Long Length of Stay Meetings, dates discussed:    Additional Comments:  Epifanio LeschesCole, Garrett Faerber Hudson, RN 08/20/2017, 10:27 AM

## 2017-08-20 NOTE — Discharge Summary (Signed)
Physician Discharge Summary  Garrett Ferrell MWN:[id number] DOB: 01-27-1961 DOA: 08/16/2017  PCP: Jackie Plum, MD  Admit date: 08/16/2017 Discharge date: 08/20/2017  Admitted From:home Disposition:home  Recommendations for Outpatient Follow-up:  1. Follow up with PCP in 1-2 weeks 2. Please obtain BMP/CBC/CK in one week  Home Health: referred outpatient PT Equipment/Devices:none Discharge Condition: Stable CODE STATUS: Full code Diet recommendation: Heart healthy  Brief/Interim Summary: 57 year old male with history of chronic atrial fibrillation on Eliquis, hypertension, traumatic brain injury, schizophrenia/bipolar, anxiety depression presented with lower back pain and dark urine.  Patient has a history of heavy alcohol use and has been drinking vodka.  Urine toxicology with cocaine and benzo positive.  Found to have rhabdomyolysis with CK of 48,000.  Elevated liver enzymes. 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  #Nontraumatic rhabdomyolysis:  CK level trending down to 2889.  Renal function normal.  Recommended to monitor labs outpatient.  #Alcohol abuse and watch for withdrawal: No sign of withdrawal.  Continue vitamin and supportive care.  Education provided to the patient.    #Transaminitis  due to alcohol use and  rhabdomyolysis: Liver enzymes are trending down.  Recommend outpatient follow-up.  #Chronic atrial fibrillation: Continue diltiazem and Eliquis.   #Pain management: X-ray of the spine and hip with chronic arthritis.  Continue Neurontin and Robaxin.  Recommended Tylenol or Motrin as needed.  Able to ambulate.  Outpatient referral to PT made.  Follow-up with PCP.  #Hyperlipidemia: Statin resumed since liver enzymes are trending down.  There is outpatient monitoring.  #Bipolar mood disorder and schizophrenia: Patient with history of traumatic brain injury.  Continue supportive care.  On Risperdal and Seroquel.  #Essential hypertension:  Monitor blood pressure.  Patient is clinically stable.  Renal function normal.  CK trending down.  Stable on discharge with outpatient follow-up.  Discussed with him in detail.  Discharge Diagnoses:  Principal Problem:   Rhabdomyolysis Active Problems:   Atrial fibrillation, chronic (HCC)   Essential hypertension   Cocaine use   Chronic musculoskeletal pain   Depression with anxiety   Bipolar disorder (HCC)   Schizophrenia (HCC)   Back pain    Discharge Instructions  Discharge Instructions    Ambulatory referral to Physical Therapy   Complete by:  As directed    Call MD for:  difficulty breathing, headache or visual disturbances   Complete by:  As directed    Call MD for:  extreme fatigue   Complete by:  As directed    Call MD for:  hives   Complete by:  As directed    Call MD for:  persistant dizziness or light-headedness   Complete by:  As directed    Call MD for:  persistant nausea and vomiting   Complete by:  As directed    Call MD for:  severe uncontrolled pain   Complete by:  As directed    Call MD for:  temperature >100.4   Complete by:  As directed    Diet - low sodium heart healthy   Complete by:  As directed    Increase activity slowly   Complete by:  As directed      Allergies as of 08/20/2017   No Known Allergies     Medication List    TAKE these medications   allopurinol 100 MG tablet Commonly known as:  ZYLOPRIM Take 100 mg by mouth daily.   ALPRAZolam 0.5 MG tablet Commonly known as:  XANAX Take 0.5 mg by mouth every  8 (eight) hours as needed for anxiety.   atorvastatin 10 MG tablet Commonly known as:  LIPITOR Take 10 mg by mouth every evening.   diltiazem 240 MG 24 hr capsule Commonly known as:  CARDIZEM CD Take 1 capsule (240 mg total) by mouth daily.   ELIQUIS 5 MG Tabs tablet Generic drug:  apixaban Take 1 tablet (5 mg total) by mouth 2 (two) times daily.   ferrous sulfate 325 (65 FE) MG EC tablet Take 325 mg by mouth daily  with breakfast.   gabapentin 300 MG capsule Commonly known as:  NEURONTIN Take 1 capsule (300 mg total) by mouth 2 (two) times daily.   losartan 25 MG tablet Commonly known as:  COZAAR Take 25 mg by mouth daily.   meloxicam 15 MG tablet Commonly known as:  MOBIC Take 15 mg by mouth daily.   methocarbamol 750 MG tablet Commonly known as:  ROBAXIN Take 1 tablet (750 mg total) by mouth every 8 (eight) hours as needed for muscle spasms.   multivitamin with minerals Tabs tablet Take 1 tablet by mouth daily. Start taking on:  08/21/2017   oxyCODONE-acetaminophen 5-325 MG tablet Commonly known as:  PERCOCET/ROXICET Take 1 tablet by mouth every 8 (eight) hours as needed for severe pain. What changed:  Another medication with the same name was removed. Continue taking this medication, and follow the directions you see here.   QUEtiapine Fumarate 150 MG 24 hr tablet Commonly known as:  SEROQUEL XR Take 150 mg by mouth at bedtime.   risperiDONE 1 MG tablet Commonly known as:  RISPERDAL Take 1-2 tablets (1-2 mg total) by mouth 2 (two) times daily. Take 1tab (1mg ) in morning and 2 tabs (2mg )at night   tiZANidine 2 MG tablet Commonly known as:  ZANAFLEX Take 2-4 mg by mouth every 8 (eight) hours as needed.   trazodone 300 MG tablet Commonly known as:  DESYREL Take 300 mg by mouth at bedtime.   VIAGRA 100 MG tablet Generic drug:  sildenafil Take 100 mg by mouth daily as needed for erectile dysfunction.   zolpidem 10 MG tablet Commonly known as:  AMBIEN Take 1 tablet (10 mg total) by mouth at bedtime as needed for sleep.      Follow-up Information    [facility] Follow [facility].   Specialty:  Rehabilitation Why:  referral has been made for outpatient PT, office will call to set appointment time Contact information: [address] [id number] [place] [phone]       Jackie Plumsei-Bonsu, George, MD.  Schedule an appointment as soon as possible for a visit in 1 week(s).   Specialty:  Internal Medicine Contact information: [address] [place] [id number] [phone]          No Known Allergies  Consultations: None  Procedures/Studies: None  Subjective: Seen and examined at bedside.  Denies headache, dizziness, nausea, vomiting, chest pain, shortness of breath, headache or dizziness.  Discharge Exam: Vitals:   08/20/17 0536 08/20/17 0929  BP: 113/77 113/78  Pulse: 80 80  Resp: 16   Temp: 98 F (36.7 C)   SpO2: 97%    Vitals:   08/19/17 1340 08/19/17 2018 08/20/17 0536 08/20/17 0929  BP: 117/77 110/80 113/77 113/78  Pulse: 89 79 80 80  Resp: 19 16 16    Temp: 97.7 F (36.5 C) 97.9 F (36.6 C) 98 F (36.7 C)   TempSrc: Oral Oral Oral   SpO2: 95% 96%  97%   Weight:      Height:        General: Pt is alert, awake, not in acute distress Cardiovascular: RRR, S1/S2 +, no rubs, no gallops Respiratory: CTA bilaterally, no wheezing, no rhonchi Abdominal: Soft, NT, ND, bowel sounds + Extremities: no edema, no cyanosis    The results of significant diagnostics from this hospitalization (including imaging, microbiology, ancillary and laboratory) are listed below for reference.     Microbiology: No results found for this or any previous visit (from the past 240 hour(s)).   Labs: BNP (last 3 results) No results for input(s): BNP in the last 8760 hours. Basic Metabolic Panel: Recent Labs  Lab 08/15/17 2025 08/17/17 0544 08/18/17 0432 08/19/17 0714  NA 140 139 141 139  K 3.4* 4.8 4.1 3.7  CL 103 105 106 105  CO2 23 25 26 27   GLUCOSE 109* 123* 117* 93  BUN 11 6 7 6   CREATININE 1.37* 1.04 1.11 1.01  CALCIUM 8.9 8.4* 8.4* 8.4*   Liver Function Tests: Recent Labs  Lab 08/15/17 2025 08/17/17 0544 08/18/17 0432 08/19/17 0714  AST 803* 432* 245* 205*  ALT 141* 116* 94* 97*  ALKPHOS 46 37* 37* 39  BILITOT 1.2 0.9 0.4 0.3  PROT 7.0  6.3* 5.8* 6.0*  ALBUMIN 4.0 3.1* 2.7* 3.0*   Recent Labs  Lab 08/16/17 0457 08/16/17 0956  LIPASE 19 21   No results for input(s): AMMONIA in the last 168 hours. CBC: Recent Labs  Lab 08/15/17 2025 08/17/17 0544  WBC 9.9 7.4  HGB 12.7* 11.4*  HCT 38.0* 35.0*  MCV 69.7* 70.7*  PLT 178 149*   Cardiac Enzymes: Recent Labs  Lab 08/17/17 0544 08/17/17 2107 08/18/17 0807 08/19/17 0714 08/20/17 0503  CKTOTAL 28,514* 15,505* 11,662* 7,013* 2,889*   BNP: Invalid input(s): POCBNP CBG: Recent Labs  Lab 08/19/17 0127 08/19/17 0830 08/19/17 1657 08/19/17 2359 08/20/17 0757  GLUCAP 104* 85 93 88 82   D-Dimer No results for input(s): DDIMER in the last 72 hours. Hgb A1c No results for input(s): HGBA1C in the last 72 hours. Lipid Profile No results for input(s): CHOL, HDL, LDLCALC, TRIG, CHOLHDL, LDLDIRECT in the last 72 hours. Thyroid function studies No results for input(s): TSH, T4TOTAL, T3FREE, THYROIDAB in the last 72 hours.  Invalid input(s): FREET3 Anemia work up No results for input(s): VITAMINB12, FOLATE, FERRITIN, TIBC, IRON, RETICCTPCT in the last 72 hours. Urinalysis    Component Value Date/Time   COLORURINE YELLOW 08/15/2017 2026   APPEARANCEUR HAZY (A) 08/15/2017 2026   LABSPEC 1.020 08/15/2017 2026   PHURINE 5.0 08/15/2017 2026   GLUCOSEU NEGATIVE 08/15/2017 2026   HGBUR LARGE (A) 08/15/2017 2026   BILIRUBINUR NEGATIVE 08/15/2017 2026   KETONESUR 20 (A) 08/15/2017 2026   PROTEINUR 100 (A) 08/15/2017 2026   NITRITE NEGATIVE 08/15/2017 2026   LEUKOCYTESUR NEGATIVE 08/15/2017 2026   Sepsis Labs Invalid input(s): PROCALCITONIN,  WBC,  LACTICIDVEN Microbiology No results found for this or any previous visit (from the past 240 hour(s)).   Time coordinating discharge: 31 minutes  SIGNED:   Maxie Barb, MD  [facility] 08/20/2017, 10:39 AM  If 7PM-7AM, please contact night-coverage [email or web address] Password [email or web address]

## 2017-08-20 NOTE — Progress Notes (Signed)
Lyndon CodeJerome Feigel to be D/C'd Home per MD order.  Discussed with the patient and all questions fully answered.  VSS, Skin clean, dry and intact without evidence of skin break down, no evidence of skin tears noted. IV catheter discontinued intact. Site without signs and symptoms of complications. Dressing and pressure applied.  An After Visit Summary was printed and given to the patient. Patient received prescription.  D/c education completed with patient/family including follow up instructions, medication list, d/c activities limitations if indicated, with other d/c instructions as indicated by MD - patient able to verbalize understanding, all questions fully answered.   Patient instructed to return to ED, call 911, or call MD for any changes in condition.   Patient escorted via WC, and D/C home via private auto.  Eligah Eastrin M Renay Crammer 08/20/2017 4:20 PM

## 2017-09-12 ENCOUNTER — Ambulatory Visit: Payer: Medicaid Other | Attending: Nephrology | Admitting: Physical Therapy

## 2017-09-18 ENCOUNTER — Ambulatory Visit (INDEPENDENT_AMBULATORY_CARE_PROVIDER_SITE_OTHER): Payer: Medicaid Other | Admitting: Orthopedic Surgery

## 2017-09-19 ENCOUNTER — Other Ambulatory Visit: Payer: Self-pay | Admitting: Physician Assistant

## 2017-09-19 NOTE — Telephone Encounter (Signed)
Refill Request.  

## 2017-10-13 ENCOUNTER — Other Ambulatory Visit: Payer: Self-pay | Admitting: Cardiovascular Disease

## 2017-10-14 NOTE — Telephone Encounter (Signed)
Noted recent episode of rhabdo. Okay to resume Eliquis by hospital but last see > 1 year ago.  Will refill Eliquis x 10 days until next f/u scheduled

## 2017-10-29 ENCOUNTER — Other Ambulatory Visit: Payer: Self-pay | Admitting: Cardiovascular Disease

## 2017-11-14 ENCOUNTER — Telehealth: Payer: Self-pay | Admitting: Cardiovascular Disease

## 2017-11-14 NOTE — Telephone Encounter (Signed)
[facility] need the dx for eliqu[facility]. Informed he is taking it due to Afib

## 2017-11-14 NOTE — Telephone Encounter (Signed)
Follow Up:; ° ° °Returning your call. °

## 2017-11-14 NOTE — Telephone Encounter (Signed)
Left message to call back  

## 2017-11-14 NOTE — Telephone Encounter (Signed)
New message    Psychologist, prison and probation servicesAngel RN [facility] diagnosis for eliquis.

## 2017-11-18 ENCOUNTER — Other Ambulatory Visit: Payer: Self-pay | Admitting: Cardiovascular Disease

## 2017-11-21 ENCOUNTER — Encounter (INDEPENDENT_AMBULATORY_CARE_PROVIDER_SITE_OTHER): Payer: Self-pay | Admitting: Orthopedic Surgery

## 2017-11-21 ENCOUNTER — Ambulatory Visit (INDEPENDENT_AMBULATORY_CARE_PROVIDER_SITE_OTHER): Payer: Medicaid Other | Admitting: Orthopedic Surgery

## 2017-11-21 ENCOUNTER — Ambulatory Visit (INDEPENDENT_AMBULATORY_CARE_PROVIDER_SITE_OTHER): Payer: Medicaid Other

## 2017-11-21 DIAGNOSIS — M12571 Traumatic arthropathy, right ankle and foot: Secondary | ICD-10-CM | POA: Diagnosis not present

## 2017-11-21 DIAGNOSIS — M25571 Pain in right ankle and joints of right foot: Secondary | ICD-10-CM

## 2017-11-21 MED ORDER — OXYCODONE-ACETAMINOPHEN 5-325 MG PO TABS: 1.0000 | ORAL_TABLET | Freq: Three times a day (TID) | ORAL | 0 refills | Status: DC | PRN

## 2017-11-21 NOTE — Progress Notes (Signed)
Office Visit Note   Patient: Garrett Ferrell           Date of Birth: 11/12/1960           MRN: [id number] Visit Date: 11/21/2017              Requested by: Jackie Plumsei-Bonsu, George, MD [id number] [place], [place] [id number] PCP: Jackie Plumsei-Bonsu, George, MD  Chief Complaint  Patient presents with  . Right Foot - Pain      HPI: Patient is a 57 year old gentleman status post subtalar and talonavicular fusion of the right.  Patient complains of medial joint line pain on the right.  Patient states he will not wear the fracture boot.  Assessment & Plan: Visit Diagnoses:  1. Right ankle pain, unspecified chronicity   2. Traumatic arthritis of right ankle     Plan: We will obtain a CT scan to evaluate for fibrous nonunion of the medial joint line of the tibiotalar joint follow-up after the CT is obtained.  Discussed that if we need to proceed with revision fusion he would have to be completely off his foot for 6 to 8 weeks.  Follow-Up Instructions: Return in about 2 weeks (around 12/05/2017).   Ortho Exam  Patient is alert, oriented, no adenopathy, well-dressed, normal affect, normal respiratory effort. Examination patient has good pulses there is no redness no swelling no cellulitis his foot is plantigrade.  He is tender to palpation of medial joint line of the right ankle.  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  The remainder of his foot is asymptomatic the sinus Tarsi is nontender to palpation the lateral tibiotalar joint is nontender to palpation.  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  No images are attached to the encounter.  Labs: No results found for: HGBA1C, ESRSEDRATE, CRP, LABURIC, REPTSTATUS, GRAMSTAIN, CULT,  LABORGA   Lab Results  Component Value Date   ALBUMIN 3.0 (L) 08/19/2017   ALBUMIN 2.7 (L) 08/18/2017   ALBUMIN 3.1 (L) 08/17/2017    There is no height or weight on file to calculate BMI.  Orders:  Orders Placed This Encounter  Procedures  . XR Ankle Complete Right  . CT ANKLE RIGHT WO CONTRAST   Meds ordered this encounter  Medications  . oxyCODONE-acetaminophen (PERCOCET/ROXICET) 5-325 MG tablet    Sig: Take 1 tablet by mouth every 8 (eight) hours as needed.    Dispense:  20 tablet    Refill:  0     Procedures: No procedures performed  Clinical Data: No additional findings.  ROS:  All other systems negative, except as noted in the HPI. Review of Systems  Objective: Vital Signs: There were no vitals taken for this visit.  Specialty Comments:  No specialty comments available.  PMFS History: Patient Active Problem List   Diagnosis Date Noted  . Back pain   . Rhabdomyolysis 08/16/2017  . Syncope 05/29/2017  . Bipolar disorder (HCC) 05/29/2017  . Schizophrenia (HCC) 05/29/2017  . TBI (traumatic brain injury) (HCC) 05/29/2017  . Unwitnessed fall 05/29/2017  . Alcohol abuse with intoxication (HCC) 05/29/2017  . Slurred speech 05/29/2017  . Chest pain 08/07/2016  . Cocaine use 08/07/2016  . Chronic musculoskeletal pain 08/07/2016  . CKD (chronic kidney disease) 08/07/2016  .  Depression with anxiety 08/07/2016  . Traumatic arthritis of right ankle 07/31/2016  . 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 Osteoarthritis of right subtalar joint   . Right ankle pain 01/24/2016  . Bilateral hip pain 01/03/2016  . Atrial fibrillation, chronic (HCC) 11/18/2015  . Essential hypertension 11/18/2015  . Hyperlipidemia 11/18/2015  . DVT, bilateral lower limbs (HCC) 11/18/2015  . Pulmonary embolus (HCC) 11/18/2015   Past Medical History:  Diagnosis Date  . Anemia    takes Iron pill daily  . Anxiety    takes Xanax daily as needed  . Arthritis   . Atrial  fibrillation (HCC)    takes Eliquis daily. Last dose will be 10/03/16  . Depression    takes Risperdal daily  . DVT (deep venous thrombosis) (HCC)    both legs in 2016  . Dyspnea    when laying on back  . Gout    takes Allopurinol daily  . Headache    occasionally  . History of shingles 2010  . Hyperlipidemia    takes Atorvastatin daily  . Hyperlipidemia    takes Atorvastatin daily  . Hypertension    takes Metoprolol daily  . Insomnia    takes Ambien and Trazodone nightly  . Joint pain   . Joint swelling    right ankle  . 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 Nocturia   . Seizures (HCC)    last one in 2002    Family History  Problem Relation Age of Onset  . Heart Problems Mother   . Heart attack Father        age 80s    Past Surgical History:  Procedure Laterality Date  . ANKLE ARTHROSCOPY Right 04/20/2016   Procedure: ANKLE ARTHROSCOPY;  Surgeon: Nadara Mustard, MD;  Location: [facility] OR;  Service: Orthopedics;  Laterality: Right;  . ANKLE ARTHROSCOPY WITH FUSION Right 10/05/2016   Procedure: RIGHT ANKLE ARTHROSCOPIC FUSION;  Surgeon: Nadara Mustard, MD;  Location: [facility] OR;  Service: Orthopedics;  Laterality: Right;  . ANKLE FUSION Right 04/20/2016   Procedure: Right Posterior Arthroscopic Subtalar Arthrodesis;  Surgeon: Nadara Mustard, MD;  Location: [facility] OR;  Service: Orthopedics;  Laterality: Right;  . ANKLE SURGERY Right    plates/screws  . COLONOSCOPY WITH PROPOFOL N/A 11/27/2016   Procedure: COLONOSCOPY WITH PROPOFOL;  Surgeon: Charolett Bumpers, MD;  Location: [facility] END[facility]COPY;  Service: Endoscopy;  Laterality: N/A;  . HERNIA REPAIR     1986-umbilical  . RIGHT HEART CATH  2016   at [facility] during his DVT/PE   Social History   Occupational History  . Occupation: disabled  Tobacco Use  . Smoking status: Never Smoker  . Smokeless tobacco: Never Used  Substance and Sexual Activity  . Alcohol use: Yes  . Drug use: No  . Sexual activity: Not  on file

## 2017-11-21 NOTE — Telephone Encounter (Signed)
Recent episode of rhabdomyolysis noted. Patient should repeat CMET prior next Eliquis refill. Also noted patient last seen in office > 1 year ago.  Will need f/u visit prior to additional medication refills

## 2017-11-30 ENCOUNTER — Other Ambulatory Visit: Payer: Self-pay | Admitting: Cardiovascular Disease

## 2017-12-25 ENCOUNTER — Encounter (INDEPENDENT_AMBULATORY_CARE_PROVIDER_SITE_OTHER): Payer: Self-pay | Admitting: Radiology

## 2018-01-01 ENCOUNTER — Other Ambulatory Visit: Payer: Self-pay | Admitting: Cardiovascular Disease

## 2018-01-16 ENCOUNTER — Telehealth (INDEPENDENT_AMBULATORY_CARE_PROVIDER_SITE_OTHER): Payer: Self-pay | Admitting: Orthopedic Surgery

## 2018-01-16 NOTE — Telephone Encounter (Signed)
Patients wife called and was asking about MRI authorization, she received the letter but is just now calling. I told her we could try to re sumbit for authorization for another MRI. Garrett DikeJennifer (wife) ask that you call her number for future scheduling #[phone]

## 2018-01-17 NOTE — Telephone Encounter (Signed)
Referral for CT reopened in chart, will work on.

## 2018-01-20 ENCOUNTER — Other Ambulatory Visit: Payer: Self-pay | Admitting: Cardiovascular Disease

## 2018-01-21 ENCOUNTER — Telehealth (INDEPENDENT_AMBULATORY_CARE_PROVIDER_SITE_OTHER): Payer: Self-pay | Admitting: Orthopedic Surgery

## 2018-01-21 NOTE — Telephone Encounter (Signed)
Patient's wife called stating that the patient's health is declining and his cardiologist is concerned with him not being able to walk.  His heart doctor wanted him to walk at least 2 miles per day for the A-fib.  She is wanting to discuss the option of not doing a CT.  CB#[phone].  Thank you.

## 2018-01-22 NOTE — Telephone Encounter (Signed)
I called and sw pt's wife. She states that he has been down for the last year and that this last one may not have healed and that's why the CT scan was ordered. The pt's wife wanted to know if this was needed in order to move forward. The pt's cardiologist wants him to move forward as quickly as possible.

## 2018-01-23 NOTE — Telephone Encounter (Signed)
Pt is s/p a talonavicular fusion and the pt wants to know if he needs to proceed with the CT scan to eval for fibrinous union. States that the last time they tried to get Berkley Harveyauth it took almost a month. See message below for details.

## 2018-01-23 NOTE — Telephone Encounter (Signed)
Set up patient for CT scan of the right foot to evaluate for a nonunion of the talonavicular fusion.

## 2018-01-24 NOTE — Telephone Encounter (Signed)
I called and sw wife to advise that we need to proceed with CT. Pt's wife was calling to check the status of this.

## 2018-01-27 NOTE — Telephone Encounter (Signed)
Had to [facility] authorization; Case/AuthorizationService [id number]:[id number]

## 2018-01-28 NOTE — Telephone Encounter (Signed)
Authorization Number:[id number] Auth Effective Date:08/26/2019Auth End Date:02/26/2018

## 2018-02-13 ENCOUNTER — Telehealth (INDEPENDENT_AMBULATORY_CARE_PROVIDER_SITE_OTHER): Payer: Self-pay | Admitting: Orthopedic Surgery

## 2018-02-13 NOTE — Telephone Encounter (Signed)
Patient wife called wanted to check on the status of Mri order

## 2018-02-14 ENCOUNTER — Encounter (INDEPENDENT_AMBULATORY_CARE_PROVIDER_SITE_OTHER): Payer: Self-pay | Admitting: *Deleted

## 2018-02-14 NOTE — Telephone Encounter (Signed)
I tried to call pt voice mail is full, we were pending insurance authorization which I received today with approval number Authorization Number:[id number] Auth Effective Date:08/26/2019Auth End Date:02/26/2018  If pt calls back he can call imaging to schedule appt, I am going to fax order over to [facility] so they can call pt to schedule appt.

## 2018-02-14 NOTE — Telephone Encounter (Signed)
Can you please advise on referral? 

## 2018-02-19 ENCOUNTER — Other Ambulatory Visit: Payer: Self-pay

## 2018-02-19 ENCOUNTER — Ambulatory Visit: Payer: Medicaid Other | Admitting: Cardiovascular Disease

## 2018-02-20 ENCOUNTER — Encounter: Payer: Self-pay | Admitting: *Deleted

## 2018-02-25 ENCOUNTER — Other Ambulatory Visit: Payer: Self-pay

## 2018-02-25 ENCOUNTER — Ambulatory Visit
Admission: RE | Admit: 2018-02-25 | Discharge: 2018-02-25 | Disposition: A | Discharge status: Home / Self Care | Payer: Medicaid Other | Source: Ambulatory Visit | Attending: Orthopedic Surgery | Admitting: Orthopedic Surgery

## 2018-02-25 DIAGNOSIS — M12571 Traumatic arthropathy, right ankle and foot: Secondary | ICD-10-CM

## 2018-02-27 ENCOUNTER — Other Ambulatory Visit: Payer: Self-pay

## 2018-03-07 ENCOUNTER — Telehealth (INDEPENDENT_AMBULATORY_CARE_PROVIDER_SITE_OTHER): Payer: Self-pay | Admitting: Orthopedic Surgery

## 2018-03-07 NOTE — Telephone Encounter (Signed)
I called and Garrett Ferrell will post for revision ankle fusion, open, anterior approach

## 2018-03-07 NOTE — Telephone Encounter (Signed)
See message below. I see that the pt had a second CT scan on 02/25/18 what do you want to do.

## 2018-03-07 NOTE — Telephone Encounter (Signed)
Patients wife [name] called and would like to see if Dr. Lajoyce Corners had received the second CT of patients right ankle? She said a second imaging was needed before proceeding with surgery. Can you give her a call back to let them know what to do? # [phone]

## 2018-03-07 NOTE — Telephone Encounter (Signed)
Did you call pt and advise?

## 2018-03-13 NOTE — Telephone Encounter (Signed)
I spoke with patient's wife. We discussed possible surgery for November 6th.

## 2018-03-15 ENCOUNTER — Other Ambulatory Visit: Payer: Self-pay | Admitting: Cardiovascular Disease

## 2018-03-25 ENCOUNTER — Other Ambulatory Visit: Payer: Self-pay | Admitting: Cardiovascular Disease

## 2018-03-25 ENCOUNTER — Ambulatory Visit (INDEPENDENT_AMBULATORY_CARE_PROVIDER_SITE_OTHER): Payer: Medicaid Other | Admitting: Cardiovascular Disease

## 2018-03-25 ENCOUNTER — Encounter: Payer: Self-pay | Admitting: Cardiovascular Disease

## 2018-03-25 VITALS — BP 108/56 | HR 103 | Ht 72.0 in | Wt 246.0 lb

## 2018-03-25 DIAGNOSIS — E7849 Other hyperlipidemia: Secondary | ICD-10-CM

## 2018-03-25 DIAGNOSIS — I482 Chronic atrial fibrillation, unspecified: Secondary | ICD-10-CM

## 2018-03-25 NOTE — Assessment & Plan Note (Signed)
History of hyperlipidemia on statin therapy.  Will recheck a lipid liver profile 

## 2018-03-25 NOTE — Assessment & Plan Note (Signed)
History of essential hypertension her blood pressure measured at 108/56.  He is on Cardizem and losartan.  Continue current medications

## 2018-03-25 NOTE — Assessment & Plan Note (Signed)
History of chronic A. fib with a ventricular response of 103 on Eliquis oral anticoagulation.  He is on Cardizem CD 240 mg a day for rate control.

## 2018-03-25 NOTE — Assessment & Plan Note (Signed)
History of bilateral DVT and saddle pulmonary embolus October 2016 while in a lengthy [place] treated with EKOS mechanical thrombectomy and switch from Coumadin to Eliquis oral anticoagulation.

## 2018-03-25 NOTE — Progress Notes (Signed)
03/25/2018 Garrett Ferrell   10/29/1960  [id number]  Primary Physician Jackie Plum, MD Primary Cardiologist: Runell Gess MD Nicholes [place], [place]  HPI:  Garrett Ferrell is a 57 y.o.  mildly overweight married African-American male father of 5 children and is accompanied by his wife Victorino Ferrell today. He was referred by Dr. Mayford Knife for cardiovascular evaluation and to be established in our practice.  I last saw him in the office 11/18/2015.  He has a history of chronic A. Fib dating back to 1999 on oral anticoagulation. Other problems included hypertension and hyperlipidemia. He has never had a heart attack or stroke. He probably had bilateral DVTs and a pulmonary embolus in October thousand 16 while living in [place] and underwent EK OS mechanical thrombectomy.  He was switched to Eliquis oral and coagulation at that time. Since I saw me 2 years ago he is remained stable.  He denies chest pain or shortness of breath.  He apparently needs right ankle surgery by Dr. Lajoyce Corners which I will clear him for at low risk.  He did have a low risk Myoview back in 2017 and echo 08/08/2016 that was normal as well.  Current Meds  Medication Sig  . allopurinol (ZYLOPRIM) 100 MG tablet Take 100 mg by mouth daily.  [name] ALPRAZolam (XANAX) 0.5 MG tablet Take 0.5 mg by mouth every 8 (eight) hours as needed for anxiety.   [name] apixaban (ELIQUIS) 5 MG TABS tablet Take 1 tablet (5 mg total) by mouth 2 (two) times daily. Need cardiology f/u prior to additional refills  . atorvastatin (LIPITOR) 10 MG tablet Take 10 mg by mouth every evening.   . diltiazem (CARDIZEM CD) 240 MG 24 hr capsule Take 1 capsule (240 mg total) by mouth daily.  . ferrous sulfate 325 (65 FE) MG EC tablet Take 325 mg by mouth daily with breakfast.  . gabapentin (NEURONTIN) 300 MG capsule Take 1 capsule (300 mg total) by mouth 2 (two) times daily.  [name] losartan (COZAAR) 25 MG tablet Take 25 mg by mouth daily.  . meloxicam (MOBIC) 15 MG  tablet Take 15 mg by mouth daily.  . methocarbamol (ROBAXIN) 750 MG tablet Take 1 tablet (750 mg total) by mouth every 8 (eight) hours as needed for muscle spasms.  . Multiple Vitamin (MULTIVITAMIN WITH MINERALS) TABS tablet Take 1 tablet by mouth daily.  [name] oxyCODONE-acetaminophen (PERCOCET/ROXICET) 5-325 MG tablet Take 1 tablet by mouth every 8 (eight) hours as needed for severe pain.  [name] oxyCODONE-acetaminophen (PERCOCET/ROXICET) 5-325 MG tablet Take 1 tablet by mouth every 8 (eight) hours as needed.  [name] QUEtiapine Fumarate (SEROQUEL XR) 150 MG 24 hr tablet Take 150 mg by mouth at bedtime.  . risperiDONE (RISPERDAL) 1 MG tablet Take 1-2 tablets (1-2 mg total) by mouth 2 (two) times daily. Take 1tab (1mg ) in morning and 2 tabs (2mg )at night  . tiZANidine (ZANAFLEX) 2 MG tablet Take 2-4 mg by mouth every 8 (eight) hours as needed.  . trazodone (DESYREL) 300 MG tablet Take 300 mg by mouth at bedtime.  [name] VIAGRA 100 MG tablet Take 100 mg by mouth daily as needed for erectile dysfunction.   [name] zolpidem (AMBIEN) 10 MG tablet Take 1 tablet (10 mg total) by mouth at bedtime as needed for sleep.     No Known Allergies  Social History   Socioeconomic History  . Marital status: Married    Spouse name: Victorino Ferrell  . Number of children: 5  .  Years of education: Not on file  . Highest education level: Not on file  Occupational History  . Occupation: disabled  Social Needs  . Financial resource strain: Not on file  . Food insecurity:    Worry: Not on file    Inability: Not on file  . Transportation needs:    Medical: Not on file    Non-medical: Not on file  Tobacco Use  . Smoking status: Never Smoker  . Smokeless tobacco: Never Used  Substance and Sexual Activity  . Alcohol use: Yes  . Drug use: No  . Sexual activity: Not on file  Lifestyle  . Physical activity:    Days per week: Not on file    Minutes per session: Not on file  . Stress: Not on file  Relationships  . Social connections:     Talks on phone: Not on file    Gets together: Not on file    Attends religious service: Not on file    Active member of club or organization: Not on file    Attends meetings of clubs or organizations: Not on file    Relationship status: Not on file  . Intimate partner violence:    Fear of current or ex partner: Not on file    Emotionally abused: Not on file    Physically abused: Not on file    Forced sexual activity: Not on file  Other Topics Concern  . Not on file  Social History Narrative  . Not on file     Review of Systems: General: negative for chills, fever, night sweats or weight changes.  Cardiovascular: negative for chest pain, dyspnea on exertion, edema, orthopnea, palpitations, paroxysmal nocturnal dyspnea or shortness of breath Dermatological: negative for rash Respiratory: negative for cough or wheezing Urologic: negative for hematuria Abdominal: negative for nausea, vomiting, diarrhea, bright red blood per rectum, melena, or hematemesis Neurologic: negative for visual changes, syncope, or dizziness All other systems reviewed and are otherwise negative except as noted above.    Blood pressure (!) 108/56, pulse (!) 103, height 6' (1.829 m), weight 246 lb (111.6 kg).  General appearance: alert and no distress Neck: no adenopathy, no carotid bruit, no JVD, supple, symmetrical, trachea midline and thyroid not enlarged, symmetric, no tenderness/mass/nodules Lungs: clear to auscultation bilaterally Heart: irregularly irregular rhythm Extremities: extremities normal, atraumatic, no cyanosis or edema Pulses: 2+ and symmetric Skin: Skin color, texture, turgor normal. No rashes or lesions Neurologic: Alert and oriented X 3, normal strength and tone. Normal symmetric reflexes. Normal coordination and gait  EKG atrial fibrillation with a ventricular response of 103, right bundle branch block with left axis deviation and low limb voltage.  I personally reviewed this  EKG.  ASSESSMENT AND PLAN:   Atrial fibrillation, chronic (HCC) History of chronic A. fib with a ventricular response of 103 on Eliquis oral anticoagulation.  He is on Cardizem CD 240 mg a day for rate control.  Essential hypertension History of essential hypertension her blood pressure measured at 108/56.  He is on Cardizem and losartan.  Continue current medications  Hyperlipidemia History of hyperlipidemia on statin therapy.  Will recheck a lipid liver profile.  Pulmonary embolus (HCC) History of bilateral DVT and saddle pulmonary embolus October 2016 while in a lengthy [place] treated with EKOS mechanical thrombectomy and switch from Coumadin to Eliquis oral anticoagulation.      Runell Gess MD FACP,FACC,FAHA, [facility] 03/25/2018 10:55 AM

## 2018-03-25 NOTE — Telephone Encounter (Signed)
Blood work done today 10/22/ Will refill when results available.

## 2018-03-25 NOTE — Patient Instructions (Signed)
Medication Instructions:  Your physician recommends that you continue on your current medications as directed. Please refer to the Current Medication list given to you today.  If you need a refill on your cardiac medications before your next appointment, please call your pharmacy.   Lab work: Your physician recommends that you return for lab work in: FASTING  If you have labs (blood work) drawn today and your tests are completely normal, you will receive your results only by: . MyChart Message (if you have MyChart) OR . A paper copy in the mail If you have any lab test that is abnormal or we need to change your treatment, we will call you to review the results.  Testing/Procedures: none  Follow-Up: At [facility], you and your health needs are our priority.  As part of our continuing mission to provide you with exceptional heart care, we have created designated Provider Care Teams.  These Care Teams include your primary Cardiologist (physician) and Advanced Practice Providers (APPs -  Physician Assistants and Nurse Practitioners) who all work together to provide you with the care you need, when you need it. You will need a follow up appointment in 12 months.  Please call our office 2 months in advance to schedule this appointment.  You may see  Dr. Berry or one of the following Advanced Practice Providers on your designated Care Team:   Luke Kilroy, PA-C Krista Kroeger, PA-C . Callie Goodrich, PA-C  Any Other Special Instructions Will Be Listed Below (If Applicable).    

## 2018-04-09 ENCOUNTER — Ambulatory Visit (INDEPENDENT_AMBULATORY_CARE_PROVIDER_SITE_OTHER): Payer: Medicaid Other | Admitting: Family

## 2018-04-09 ENCOUNTER — Encounter (INDEPENDENT_AMBULATORY_CARE_PROVIDER_SITE_OTHER): Payer: Self-pay | Admitting: Family

## 2018-04-09 VITALS — Ht 72.0 in | Wt 246.0 lb

## 2018-04-09 DIAGNOSIS — M12571 Traumatic arthropathy, right ankle and foot: Secondary | ICD-10-CM | POA: Diagnosis not present

## 2018-04-09 DIAGNOSIS — M19071 Primary osteoarthritis, right ankle and foot: Secondary | ICD-10-CM | POA: Diagnosis not present

## 2018-04-09 MED ORDER — OXYCODONE-ACETAMINOPHEN 5-325 MG PO TABS: 1.0000 | ORAL_TABLET | Freq: Three times a day (TID) | ORAL | 0 refills | Status: DC | PRN

## 2018-04-09 NOTE — Progress Notes (Signed)
Office Visit Note   Patient: Garrett Ferrell           Date of Birth: 12/03/1960           MRN: [id number] Visit Date: 04/09/2018              Requested by: Jackie Plum, MD [address] [place], [place] [address] PCP: Jackie Plum, MD  Chief Complaint  Patient presents with  . Right Ankle - Pain, Edema      HPI: Patient is a 57 year old gentleman status post subtalar and talonavicular fusion of the right.  Patient complains of medial joint line pain on the right. Pain is excruiating. Is unable to wait until his surgery next week to have relief of his pain.  Patient states he will not wear the fracture boot.  Assessment & Plan: Visit Diagnoses:  1. Osteoarthritis of right subtalar joint   2. Traumatic arthritis of right ankle     Plan: Is set up for ankle fusion in 2 week. Given an order for percocet for pain for next week. Will follow up in office post operatively. Cannot take NSAIDs due to his Eliquis.  Follow-Up Instructions: Return for post op.   Ortho Exam  Patient is alert, oriented, no adenopathy, well-dressed, normal affect, normal respiratory effort. Examination patient has good pulses there is no redness no swelling no cellulitis his foot is plantigrade.  He is tender to palpation of medial joint line of the right ankle.  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  The sinus Tarsi is nontender to palpation the lateral tibiotalar joint is nontender to palpation.  Imaging: No results found. No images are attached to the encounter.  Labs: No results found for: HGBA1C, ESRSEDRATE, CRP, LABURIC, REPTSTATUS, GRAMSTAIN, CULT, LABORGA   Lab Results  Component Value Date   ALBUMIN 3.0 (L) 08/19/2017   ALBUMIN 2.7 (L) 08/18/2017   ALBUMIN 3.1 (L) 08/17/2017    Body mass index is 33.36 kg/m.  Orders:  No orders of the defined types were placed in this encounter.  Meds ordered this encounter  Medications  . oxyCODONE-acetaminophen  (PERCOCET/ROXICET) 5-325 MG tablet    Sig: Take 1 tablet by mouth every 8 (eight) hours as needed.    Dispense:  20 tablet    Refill:  0     Procedures: No procedures performed  Clinical Data: No additional findings.  ROS:  All other systems negative, except as noted in the HPI. Review of Systems  Constitutional: Negative for chills and fever.  Musculoskeletal: Positive for arthralgias.    Objective: Vital Signs: Ht 6' (1.829 m)   Wt 246 lb (111.6 kg)   BMI 33.36 kg/m   Specialty Comments:  No specialty comments available.  PMFS History: Patient Active Problem List   Diagnosis Date Noted  . Back pain   . Rhabdomyolysis 08/16/2017  . Syncope 05/29/2017  . Bipolar disorder (HCC) 05/29/2017  . Schizophrenia (HCC) 05/29/2017  . TBI (traumatic brain injury) (HCC) 05/29/2017  . Unwitnessed fall 05/29/2017  . Alcohol abuse with intoxication (HCC) 05/29/2017  . Slurred speech 05/29/2017  . Chest pain 08/07/2016  . Cocaine use 08/07/2016  . Chronic musculoskeletal pain 08/07/2016  . CKD (chronic kidney disease) 08/07/2016  . Depression with anxiety 08/07/2016  . Traumatic arthritis of right ankle 07/31/2016  . 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 Osteoarthritis of right subtalar joint   . Right ankle pain 01/24/2016  . Bilateral hip pain 01/03/2016  .  Atrial fibrillation, chronic 11/18/2015  . Essential hypertension 11/18/2015  . Hyperlipidemia 11/18/2015  . DVT, bilateral lower limbs (HCC) 11/18/2015  . Pulmonary embolus (HCC) 11/18/2015   Past Medical History:  Diagnosis Date  . Anemia    takes Iron pill daily  . Anxiety    takes Xanax daily as needed  . Arthritis   . Atrial fibrillation (HCC)    takes Eliquis daily. Last dose will be 10/03/16  . Depression    takes Risperdal daily  . DVT (deep venous thrombosis) (HCC)    both legs in 2016  . Dyspnea    when laying on back  . Gout    takes Allopurinol daily  . Headache    occasionally  .  History of shingles 2010  . Hyperlipidemia    takes Atorvastatin daily  . Hyperlipidemia    takes Atorvastatin daily  . Hypertension    takes Metoprolol daily  . Insomnia    takes Ambien and Trazodone nightly  . Joint pain   . Joint swelling    right ankle  . 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 Nocturia   . Seizures (HCC)    last one in 2002    Family History  Problem Relation Age of Onset  . Heart Problems Mother   . Heart attack Father        age 41s    Past Surgical History:  Procedure Laterality Date  . ANKLE ARTHROSCOPY Right 04/20/2016   Procedure: ANKLE ARTHROSCOPY;  Surgeon: Nadara Mustard, MD;  Location[facility] OR;  Service: Orthopedics;  Laterality: Right;  . ANKLE ARTHROSCOPY WITH FUSION Right 10/05/2016   Procedure: RIGHT ANKLE ARTHROSCOPIC FUSION;  Surgeon: Nadara Mustard, MD;  Location: [facility] OR;  Service: Orthopedics;  Laterality: Right;  . ANKLE FUSION Right 04/20/2016   Procedure: Right Posterior Arthroscopic Subtalar Arthrodesis;  Surgeon: Nadara Mustard, MD;  Location: [facility] OR;  Service: Orthopedics;  Laterality: Right;  . ANKLE SURGERY Right    plates/screws  . COLONOSCOPY WITH PROPOFOL N/A 11/27/2016   Procedure: COLONOSCOPY WITH PROPOFOL;  Surgeon: Charolett Bumpers, MD;  Location: [facility] END[facility]COPY;  Service: Endoscopy;  Laterality: N/A;  . HERNIA REPAIR     1986-umbilical  . RIGHT HEART CATH  2016   at [facility] during his DVT/PE   Social History   Occupational History  . Occupation: disabled  Tobacco Use  . Smoking status: Never Smoker  . Smokeless tobacco: Never Used  Substance and Sexual Activity  . Alcohol use: Yes  . Drug use: No  . Sexual activity: Not on file

## 2018-04-11 ENCOUNTER — Ambulatory Visit (INDEPENDENT_AMBULATORY_CARE_PROVIDER_SITE_OTHER): Payer: Self-pay | Admitting: Physician Assistant

## 2018-04-14 ENCOUNTER — Other Ambulatory Visit: Payer: Self-pay

## 2018-04-14 ENCOUNTER — Encounter (HOSPITAL_COMMUNITY): Payer: Self-pay | Admitting: *Deleted

## 2018-04-14 NOTE — Progress Notes (Signed)
Mr Candelas denies chest pain or shortness of breath. Eliquias was not listed on patient's medication record.  Patient reports that he does take it and he checked for the dose of 5 mg 2 times a day.  I asked patient if he was given instructions on Eliquis and he said know, I will check with Elnita Maxwell in am.  I instructed patient to stop Meloxicam. Dr Herbie Baltimore is patient's cardiologist.

## 2018-04-15 ENCOUNTER — Encounter (HOSPITAL_COMMUNITY): Payer: Self-pay | Admitting: *Deleted

## 2018-04-15 NOTE — Anesthesia Preprocedure Evaluation (Addendum)
Anesthesia Evaluation  Patient identified by MRN, date of birth, ID band Patient awake    Reviewed: Allergy & Precautions, NPO status , Patient's Chart, lab work & pertinent test results  Airway Mallampati: II  TM Distance: >3 FB Neck ROM: Full    Dental no notable dental hx.    Pulmonary neg pulmonary ROS,    Pulmonary exam normal breath sounds clear to auscultation       Cardiovascular hypertension, Pt. on medications Normal cardiovascular exam+ dysrhythmias Atrial Fibrillation  Rhythm:Regular Rate:Normal     Neuro/Psych  Headaches, Seizures -,  PSYCHIATRIC DISORDERS Anxiety Depression Bipolar Disorder Schizophrenia    GI/Hepatic negative GI ROS, Neg liver ROS,   Endo/Other  negative endocrine ROS  Renal/GU negative Renal ROS  negative genitourinary   Musculoskeletal  (+) Arthritis , Osteoarthritis,    Abdominal (+) + obese,   Peds negative pediatric ROS (+)  Hematology negative hematology ROS (+)   Anesthesia Other Findings   Reproductive/Obstetrics negative OB ROS                            Anesthesia Physical Anesthesia Plan  ASA: III  Anesthesia Plan: General   Post-op Pain Management:  Regional for Post-op pain   Induction: Intravenous  PONV Risk Score and Plan: 2 and Ondansetron and Midazolam  Airway Management Planned: LMA  Additional Equipment:   Intra-op Plan:   Post-operative Plan: Extubation in OR  Informed Consent: I have reviewed the patients History and Physical, chart, labs and discussed the procedure including the risks, benefits and alternatives for the proposed anesthesia with the patient or authorized representative who has indicated his/her understanding and acceptance.   Dental advisory given  Plan Discussed with: CRNA  Anesthesia Plan Comments: (See PAT note written 04/15/2018 by Shonna ChockAllison Zelenak, PA-C. )       Anesthesia Quick Evaluation                                   Anesthesia Evaluation  Patient identified by MRN, date of birth, ID band Patient awake    Reviewed: Allergy & Precautions, NPO status , Patient's Chart, lab work & pertinent test results  Airway Mallampati: II  TM Distance: >3 FB     Dental  (+) Teeth Intact, Dental Advisory Given   Pulmonary    breath sounds clear to auscultation       Cardiovascular hypertension,  Rhythm:Regular Rate:Normal     Neuro/Psych    GI/Hepatic   Endo/Other    Renal/GU      Musculoskeletal   Abdominal   Peds  Hematology   Anesthesia Other Findings   Reproductive/Obstetrics                             Anesthesia Physical Anesthesia Plan  ASA: III  Anesthesia Plan: General and Regional   Post-op Pain Management:    Induction: Intravenous  Airway Management Planned: LMA  Additional Equipment:   Intra-op Plan:   Post-operative Plan:   Informed Consent: I have reviewed the patients History and Physical, chart, labs and discussed the procedure including the risks, benefits and alternatives for the proposed anesthesia with the patient or authorized representative who has indicated his/her understanding and acceptance.   Dental advisory given  Plan Discussed with:   Anesthesia Plan Comments:  Anesthesia Quick Evaluation

## 2018-04-15 NOTE — Progress Notes (Signed)
Anesthesia Chart Review: SAME DAY WORK-UP   Case:  [id number] Date/Time:  04/16/18 0938   Procedure:  REVISION ANKLE FUSION, OPEN, ANTERIOR APPROACH (Right )   Anesthesia type:  Choice   Pre-op diagnosis:  Failed Arthrodesis Right Ankle   [facility]  [facility] ROOM 03 / [facility]   Surgeon:  Nadara Mustard, MD      DISCUSSION: Patient is a 57 year old male scheduled for the above procedure.   History includes never smoker, atrial fib (diangnosed 1999), HLD, DVT BLE/sadde PE (s/p thrombolysis 03/24/15, [facility]), HTN, HLD, exertional dyspnea, hiatal hernia, anemia, TBI (20 years ago), Bipolar disorder, schizophrenia, seizures. Notes indicate that he is a former Stage [employer]. - Admission 08/16/17-08/20/17 with non-traumatic rhabdomyolysis in the setting of alcohol and cocaine use. CK peak at 48,703, AST peak 803, ALT 141. Normal hepatobilary system, pancreas, and spleen by CT.  - Admission 05/29/17-06/01/17 for syncope with alcohol intoxication ("drinks 1 bottle of vodka every 1-2 days"). Head CT negative for CVA. MRI showed chronic small vessel ischemic disease. Neurology felt syncope likely secondary to alcohol intoxication/dehydration/polypharmacy, also suspicion for micturition syncope.   Per patient's cardiologist Dr. Allyson Sabal (under Letters tab), "Garrett Ferrell is at low risk, from a cardiac standpoint, for his upcoming procedure.  It is ok to proceed without further cardiac testing." Patient stopping Eliquis 2 days prior to surgery.   He is for labs includes CBC and CMET on the day of surgery. Positive UDS for cocaine and benzodiazepines on 08/15/17. Denied current illicit drug use. Still drinking alcohol. Patient is a same day work-up, so further evaluation by his anesthesia team on the day of surgery.   PROVIDERS: Jackie Plum, MD is PCP Nanetta Batty, MD is cardiologist   LABS: He is for labs on the day of surgery since he is a same day work-up.    IMAGES: CT right ankle  02/25/18: IMPRESSION: 1. Failed arthrodesis of the tibiotalar joint. Moderate joint effusion with synovitis. 2. Solid posterior subtalar arthrodesis.  CTA head/neck 05/29/17: IMPRESSION: 1. Negative for emergent large vessel occlusion. 2. Somewhat early contrast timing for the bilateral distal MCA branches. No MCA branch occlusion is identified. 3. Only minimal atherosclerosis identified in the head and neck. No arterial stenosis identified.   EKG: 03/27/18 ([facility]): Afib at 103 bpm. LAD. Right BBB. Inferior infarct (age undetermined).   CV: Echo 08/08/16: Study Conclusions - Left ventricle: The cavity size was normal. There was severe   concentric hypertrophy. Systolic function was normal. The   estimated ejection fraction was in the range of 60% to 65%. Wall   motion was normal; there were no regional wall motion   abnormalities. Left ventricular diastolic function parameters   were normal. - Left atrium: The atrium was moderately dilated.  Nuclear stress test 04/05/16:  There was no ST segment deviation noted during stress.  No T wave inversion was noted during stress.  Defect 1: There is a small defect of mild severity present in the basal inferior location.  The study is normal.  This is a low risk study. Low risk stress nuclear study with mild fixed inferior defect, probably diaphragmatic attenuation artifact.  Non gated study.   Past Medical History:  Diagnosis Date  . Anemia    takes Iron pill daily  . Anxiety    takes Xanax daily as needed  . Arthritis   . Atrial fibrillation (HCC)    takes Eliquis daily. Last dose will be 10/03/16  . Bipolar disorder (HCC)   .  Depression    takes Risperdal daily  . DVT (deep venous thrombosis) (HCC)    both legs in 2016  . Dyspnea    when laying on back, with extertion  . Dysrhythmia    Afib  . Gout    takes Allopurinol daily  . Head injury   . Headache    occasionally  . History of hiatal hernia   .  History of shingles 2010  . Hyperlipidemia    takes Atorvastatin daily  . Hyperlipidemia    takes Atorvastatin daily  . Hypertension    takes Metoprolol daily  . Insomnia    takes Ambien and Trazodone nightly  . Joint pain   . Joint swelling    right ankle  . Memory impairment   . 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 Nocturia   . Schizophrenia (HCC)   . Seizures (HCC)    last one in 2002    Past Surgical History:  Procedure Laterality Date  . ANKLE ARTHROSCOPY Right 04/20/2016   Procedure: ANKLE ARTHROSCOPY;  Surgeon: Nadara Mustard, MD;  Location: [facility] OR;  Service: Orthopedics;  Laterality: Right;  . ANKLE ARTHROSCOPY WITH FUSION Right 10/05/2016   Procedure: RIGHT ANKLE ARTHROSCOPIC FUSION;  Surgeon: Nadara Mustard, MD;  Location: [facility] OR;  Service: Orthopedics;  Laterality: Right;  . ANKLE FUSION Right 04/20/2016   Procedure: Right Posterior Arthroscopic Subtalar Arthrodesis;  Surgeon: Nadara Mustard, MD;  Location: [facility] OR;  Service: Orthopedics;  Laterality: Right;  . COLONOSCOPY WITH PROPOFOL N/A 11/27/2016   Procedure: COLONOSCOPY WITH PROPOFOL;  Surgeon: Charolett Bumpers, MD;  Location: [facility];  Service: Endoscopy;  Laterality: N/A;  . HERNIA REPAIR     1986-umbilical  . RIGHT HEART CATH  2016   at [facility] during his DVT/PE    MEDICATIONS: No current facility-administered medications for this encounter.    [name] allopurinol (ZYLOPRIM) 100 MG tablet  . ALPRAZolam (XANAX) 0.5 MG tablet  . apixaban (ELIQUIS) 5 MG TABS tablet  . atorvastatin (LIPITOR) 10 MG tablet  . diltiazem (CARDIZEM CD) 240 MG 24 hr capsule  . ferrous sulfate 325 (65 FE) MG EC tablet  . gabapentin (NEURONTIN) 300 MG capsule  . losartan (COZAAR) 25 MG tablet  . meloxicam (MOBIC) 15 MG tablet  . Multiple Vitamin (MULTIVITAMIN WITH MINERALS) TABS tablet  . QUEtiapine (SEROQUEL XR) 50 MG TB24 24 hr tablet  . risperiDONE (RISPERDAL) 1 MG tablet  . tiZANidine (ZANAFLEX) 2  MG tablet  . trazodone (DESYREL) 300 MG tablet  . zolpidem (AMBIEN) 10 MG tablet  . methocarbamol (ROBAXIN) 750 MG tablet  . oxyCODONE-acetaminophen (PERCOCET/ROXICET) 5-325 MG tablet  . VIAGRA 100 MG tablet    Velna Ochs [facility] Short Stay Center/Anesthesiology Phone [phone] 04/15/2018 2:07 PM

## 2018-04-15 NOTE — Progress Notes (Addendum)
Mrs Blazejewski called and asked for what medications patient should take in am.  Mr Stepanek was with patient and gave me permission to speak to his wife.  Mr Brimage reports that patient had a head injury 20 years ago and has some memory issues.  Mrs Goswami reports that she has held Mobic and is holding Eliquis for 2 days.  Patient has clearance from Dr Allyson Sabal for surgery. I went over health history with Mrs Moxley and gave instructions on arrival time and medication and npo after midnight.

## 2018-04-16 ENCOUNTER — Other Ambulatory Visit: Payer: Self-pay

## 2018-04-16 ENCOUNTER — Encounter (HOSPITAL_COMMUNITY)
Admission: RE | Disposition: A | Discharge status: Home / Self Care | Payer: Self-pay | Source: Ambulatory Visit | Attending: Orthopedic Surgery

## 2018-04-16 ENCOUNTER — Ambulatory Visit (HOSPITAL_COMMUNITY): Payer: Medicaid Other | Admitting: Vascular Surgery

## 2018-04-16 ENCOUNTER — Observation Stay (HOSPITAL_COMMUNITY)
Admission: RE | Admit: 2018-04-16 | Discharge: 2018-04-17 | Disposition: A | Discharge status: Home / Self Care | Payer: Medicaid Other | Source: Ambulatory Visit | Attending: Orthopedic Surgery | Admitting: Orthopedic Surgery

## 2018-04-16 ENCOUNTER — Encounter (HOSPITAL_COMMUNITY): Payer: Self-pay

## 2018-04-16 DIAGNOSIS — I4510 Unspecified right bundle-branch block: Secondary | ICD-10-CM | POA: Insufficient documentation

## 2018-04-16 DIAGNOSIS — Z7901 Long term (current) use of anticoagulants: Secondary | ICD-10-CM | POA: Diagnosis not present

## 2018-04-16 DIAGNOSIS — I4891 Unspecified atrial fibrillation: Secondary | ICD-10-CM | POA: Insufficient documentation

## 2018-04-16 DIAGNOSIS — M2540 Effusion, unspecified joint: Secondary | ICD-10-CM | POA: Insufficient documentation

## 2018-04-16 DIAGNOSIS — S92191A Other fracture of right talus, initial encounter for closed fracture: Secondary | ICD-10-CM | POA: Diagnosis not present

## 2018-04-16 DIAGNOSIS — X58XXXA Exposure to other specified factors, initial encounter: Secondary | ICD-10-CM | POA: Diagnosis not present

## 2018-04-16 DIAGNOSIS — F209 Schizophrenia, unspecified: Secondary | ICD-10-CM | POA: Insufficient documentation

## 2018-04-16 DIAGNOSIS — M1990 Unspecified osteoarthritis, unspecified site: Secondary | ICD-10-CM | POA: Diagnosis not present

## 2018-04-16 DIAGNOSIS — E785 Hyperlipidemia, unspecified: Secondary | ICD-10-CM | POA: Diagnosis not present

## 2018-04-16 DIAGNOSIS — M109 Gout, unspecified: Secondary | ICD-10-CM | POA: Insufficient documentation

## 2018-04-16 DIAGNOSIS — Z981 Arthrodesis status: Secondary | ICD-10-CM

## 2018-04-16 DIAGNOSIS — G4700 Insomnia, unspecified: Secondary | ICD-10-CM | POA: Diagnosis not present

## 2018-04-16 DIAGNOSIS — Z86711 Personal history of pulmonary embolism: Secondary | ICD-10-CM | POA: Diagnosis not present

## 2018-04-16 DIAGNOSIS — I10 Essential (primary) hypertension: Secondary | ICD-10-CM | POA: Diagnosis not present

## 2018-04-16 DIAGNOSIS — F319 Bipolar disorder, unspecified: Secondary | ICD-10-CM | POA: Insufficient documentation

## 2018-04-16 DIAGNOSIS — M659 Synovitis and tenosynovitis, unspecified: Secondary | ICD-10-CM | POA: Insufficient documentation

## 2018-04-16 DIAGNOSIS — Z6832 Body mass index (BMI) 32.0-32.9, adult: Secondary | ICD-10-CM | POA: Insufficient documentation

## 2018-04-16 DIAGNOSIS — F419 Anxiety disorder, unspecified: Secondary | ICD-10-CM | POA: Insufficient documentation

## 2018-04-16 DIAGNOSIS — Y793 Surgical instruments, materials and orthopedic devices (including sutures) associated with adverse incidents: Secondary | ICD-10-CM | POA: Insufficient documentation

## 2018-04-16 DIAGNOSIS — M9689 Other intraoperative and postprocedural complications and disorders of the musculoskeletal system: Secondary | ICD-10-CM

## 2018-04-16 DIAGNOSIS — Z86718 Personal history of other venous thrombosis and embolism: Secondary | ICD-10-CM | POA: Insufficient documentation

## 2018-04-16 DIAGNOSIS — E669 Obesity, unspecified: Secondary | ICD-10-CM | POA: Insufficient documentation

## 2018-04-16 DIAGNOSIS — T84098A Other mechanical complication of other internal joint prosthesis, initial encounter: Principal | ICD-10-CM | POA: Insufficient documentation

## 2018-04-16 DIAGNOSIS — M960 Pseudarthrosis after fusion or arthrodesis: Secondary | ICD-10-CM | POA: Diagnosis not present

## 2018-04-16 HISTORY — PX: ANKLE FUSION: SHX5718

## 2018-04-16 HISTORY — DX: Schizophrenia, unspecified: F20.9

## 2018-04-16 HISTORY — DX: Other amnesia: R41.3

## 2018-04-16 HISTORY — DX: Other pulmonary embolism without acute cor pulmonale: I26.99

## 2018-04-16 HISTORY — DX: Bipolar disorder, unspecified: F31.9

## 2018-04-16 HISTORY — DX: Personal history of other diseases of the digestive system: Z87.19

## 2018-04-16 HISTORY — DX: Unspecified injury of head, initial encounter: S09.90XA

## 2018-04-16 HISTORY — DX: Cardiac arrhythmia, unspecified: I49.9

## 2018-04-16 HISTORY — PX: ANKLE SURGERY: SHX546

## 2018-04-16 LAB — COMPREHENSIVE METABOLIC PANEL
ALBUMIN: 3.3 g/dL — AB (ref 3.5–5.0)
ALK PHOS: 40 U/L (ref 38–126)
ALT: 21 U/L (ref 0–44)
AST: 27 U/L (ref 15–41)
Anion gap: 6 (ref 5–15)
BILIRUBIN TOTAL: 0.8 mg/dL (ref 0.3–1.2)
BUN: 7 mg/dL (ref 6–20)
CALCIUM: 8.4 mg/dL — AB (ref 8.9–10.3)
CO2: 24 mmol/L (ref 22–32)
Chloride: 110 mmol/L (ref 98–111)
Creatinine, Ser: 1.12 mg/dL (ref 0.61–1.24)
GFR calc Af Amer: >60 mL/min (ref >60)
GFR calc non Af Amer: >60 mL/min (ref >60)
GLUCOSE: 101 mg/dL — AB (ref 70–99)
POTASSIUM: 4.3 mmol/L (ref 3.5–5.1)
SODIUM: 140 mmol/L (ref 135–145)
Total Protein: 6.6 g/dL (ref 6.5–8.1)

## 2018-04-16 LAB — PROTIME-INR
INR: 1.02
Prothrombin Time: 13.3 seconds (ref 11.4–15.2)

## 2018-04-16 LAB — CBC
HEMATOCRIT: 36.6 % — AB (ref 39.0–52.0)
HEMOGLOBIN: 11.4 g/dL — AB (ref 13.0–17.0)
MCH: 22.8 pg — ABNORMAL LOW (ref 26.0–34.0)
MCHC: 31.1 g/dL (ref 30.0–36.0)
MCV: 73.3 fL — ABNORMAL LOW (ref 80.0–100.0)
NRBC: 0 % (ref 0.0–0.2)
Platelets: 169 10*3/uL (ref 150–400)
RBC: 4.99 MIL/uL (ref 4.22–5.81)
RDW: 14.6 % (ref 11.5–15.5)
WBC: 3.8 10*3/uL — AB (ref 4.0–10.5)

## 2018-04-16 SURGERY — ANKLE FUSION
Anesthesia: General | Laterality: Right

## 2018-04-16 MED ORDER — MIDAZOLAM HCL 2 MG/2ML IJ SOLN
INTRAMUSCULAR | Status: AC
  Administered 2018-04-16: 2 mg via INTRAVENOUS
  Filled 2018-04-16: qty 2

## 2018-04-16 MED ORDER — SODIUM CHLORIDE 0.9 % IV SOLN
INTRAVENOUS | Status: DC | PRN
  Administered 2018-04-16: 50 ug/min via INTRAVENOUS

## 2018-04-16 MED ORDER — MIDAZOLAM HCL 2 MG/2ML IJ SOLN
INTRAMUSCULAR | Status: AC
  Filled 2018-04-16: qty 2

## 2018-04-16 MED ORDER — PROMETHAZINE HCL 25 MG/ML IJ SOLN: 6.2500 mg | INTRAMUSCULAR | Status: DC | PRN

## 2018-04-16 MED ORDER — CHLORHEXIDINE GLUCONATE 4 % EX LIQD: 60.0000 mL | Freq: Once | CUTANEOUS | Status: DC

## 2018-04-16 MED ORDER — ALPRAZOLAM 0.5 MG PO TABS: 0.5000 mg | ORAL_TABLET | Freq: Three times a day (TID) | ORAL | Status: DC | PRN

## 2018-04-16 MED ORDER — ACETAMINOPHEN 325 MG PO TABS: 325.0000 mg | ORAL_TABLET | Freq: Four times a day (QID) | ORAL | Status: DC | PRN

## 2018-04-16 MED ORDER — METHOCARBAMOL 500 MG PO TABS
ORAL_TABLET | ORAL | Status: AC
  Administered 2018-04-16: 500 mg via ORAL
  Filled 2018-04-16: qty 1

## 2018-04-16 MED ORDER — PHENYLEPHRINE 40 MCG/ML (10ML) SYRINGE FOR IV PUSH (FOR BLOOD PRESSURE SUPPORT)
PREFILLED_SYRINGE | INTRAVENOUS | Status: AC
  Filled 2018-04-16: qty 10

## 2018-04-16 MED ORDER — PHENYLEPHRINE 40 MCG/ML (10ML) SYRINGE FOR IV PUSH (FOR BLOOD PRESSURE SUPPORT)
PREFILLED_SYRINGE | INTRAVENOUS | Status: DC | PRN
  Administered 2018-04-16: 120 ug via INTRAVENOUS
  Administered 2018-04-16: 160 ug via INTRAVENOUS
  Administered 2018-04-16: 120 ug via INTRAVENOUS
  Administered 2018-04-16: 160 ug via INTRAVENOUS
  Administered 2018-04-16: 80 ug via INTRAVENOUS
  Administered 2018-04-16: 160 ug via INTRAVENOUS

## 2018-04-16 MED ORDER — POLYETHYLENE GLYCOL 3350 17 G PO PACK: 17.0000 g | PACK | Freq: Every day | ORAL | Status: DC | PRN

## 2018-04-16 MED ORDER — PROPOFOL 10 MG/ML IV BOLUS
INTRAVENOUS | Status: AC
  Filled 2018-04-16: qty 20

## 2018-04-16 MED ORDER — TRAZODONE HCL 100 MG PO TABS
300.0000 mg | ORAL_TABLET | Freq: Every day | ORAL | Status: DC
  Administered 2018-04-16: 300 mg via ORAL
  Filled 2018-04-16: qty 3

## 2018-04-16 MED ORDER — RISPERIDONE 0.5 MG PO TABS
1.0000 mg | ORAL_TABLET | Freq: Every day | ORAL | Status: DC
  Administered 2018-04-17: 1 mg via ORAL
  Filled 2018-04-16: qty 2

## 2018-04-16 MED ORDER — OXYCODONE HCL 5 MG PO TABS
10.0000 mg | ORAL_TABLET | ORAL | Status: DC | PRN
  Administered 2018-04-16: 10 mg via ORAL
  Administered 2018-04-17: 15 mg via ORAL
  Filled 2018-04-16: qty 3

## 2018-04-16 MED ORDER — DOCUSATE SODIUM 100 MG PO CAPS
100.0000 mg | ORAL_CAPSULE | Freq: Two times a day (BID) | ORAL | Status: DC
  Administered 2018-04-16 – 2018-04-17 (×2): 100 mg via ORAL
  Filled 2018-04-16 (×2): qty 1

## 2018-04-16 MED ORDER — APIXABAN 5 MG PO TABS
5.0000 mg | ORAL_TABLET | Freq: Two times a day (BID) | ORAL | Status: DC
  Administered 2018-04-16 – 2018-04-17 (×2): 5 mg via ORAL
  Filled 2018-04-16 (×2): qty 1

## 2018-04-16 MED ORDER — CEFAZOLIN SODIUM-DEXTROSE 1-4 GM/50ML-% IV SOLN
1.0000 g | Freq: Four times a day (QID) | INTRAVENOUS | Status: AC
  Administered 2018-04-16 – 2018-04-17 (×3): 1 g via INTRAVENOUS
  Filled 2018-04-16 (×3): qty 50

## 2018-04-16 MED ORDER — HYDROMORPHONE HCL 1 MG/ML IJ SOLN: 0.5000 mg | INTRAMUSCULAR | Status: DC | PRN

## 2018-04-16 MED ORDER — PHENYLEPHRINE HCL 10 MG/ML IJ SOLN
INTRAMUSCULAR | Status: AC
  Filled 2018-04-16: qty 1

## 2018-04-16 MED ORDER — MIDAZOLAM HCL 2 MG/2ML IJ SOLN
2.0000 mg | Freq: Once | INTRAMUSCULAR | Status: AC
  Administered 2018-04-16: 2 mg via INTRAVENOUS

## 2018-04-16 MED ORDER — FENTANYL CITRATE (PF) 250 MCG/5ML IJ SOLN
INTRAMUSCULAR | Status: AC
  Filled 2018-04-16: qty 5

## 2018-04-16 MED ORDER — LOSARTAN POTASSIUM 25 MG PO TABS
25.0000 mg | ORAL_TABLET | Freq: Every day | ORAL | Status: DC
  Administered 2018-04-17: 25 mg via ORAL
  Filled 2018-04-16: qty 1

## 2018-04-16 MED ORDER — ONDANSETRON HCL 4 MG/2ML IJ SOLN
INTRAMUSCULAR | Status: AC
  Filled 2018-04-16: qty 2

## 2018-04-16 MED ORDER — ROPIVACAINE HCL 5 MG/ML IJ SOLN
INTRAMUSCULAR | Status: DC | PRN
  Administered 2018-04-16: 30 mL via PERINEURAL

## 2018-04-16 MED ORDER — MAGNESIUM CITRATE PO SOLN: 1.0000 | Freq: Once | ORAL | Status: DC | PRN

## 2018-04-16 MED ORDER — OXYCODONE HCL 5 MG PO TABS
5.0000 mg | ORAL_TABLET | ORAL | Status: DC | PRN
  Administered 2018-04-16 (×2): 10 mg via ORAL
  Filled 2018-04-16 (×2): qty 2

## 2018-04-16 MED ORDER — QUETIAPINE FUMARATE ER 50 MG PO TB24
150.0000 mg | ORAL_TABLET | Freq: Every day | ORAL | Status: DC
  Administered 2018-04-16: 150 mg via ORAL
  Filled 2018-04-16: qty 3

## 2018-04-16 MED ORDER — LACTATED RINGERS IV SOLN
INTRAVENOUS | Status: DC
  Administered 2018-04-16 (×2): via INTRAVENOUS

## 2018-04-16 MED ORDER — FENTANYL CITRATE (PF) 100 MCG/2ML IJ SOLN
100.0000 ug | Freq: Once | INTRAMUSCULAR | Status: AC
  Administered 2018-04-16: 100 ug via INTRAVENOUS

## 2018-04-16 MED ORDER — HYDROMORPHONE HCL 1 MG/ML IJ SOLN
INTRAMUSCULAR | Status: AC
  Administered 2018-04-16: 0.25 mg via INTRAVENOUS
  Filled 2018-04-16: qty 1

## 2018-04-16 MED ORDER — FENTANYL CITRATE (PF) 100 MCG/2ML IJ SOLN
INTRAMUSCULAR | Status: AC
  Administered 2018-04-16: 100 ug via INTRAVENOUS
  Filled 2018-04-16: qty 2

## 2018-04-16 MED ORDER — DILTIAZEM HCL ER COATED BEADS 240 MG PO CP24
240.0000 mg | ORAL_CAPSULE | Freq: Every day | ORAL | Status: DC
  Administered 2018-04-17: 240 mg via ORAL
  Filled 2018-04-16: qty 1

## 2018-04-16 MED ORDER — RISPERIDONE 0.5 MG PO TABS
2.0000 mg | ORAL_TABLET | Freq: Every day | ORAL | Status: DC
  Administered 2018-04-16: 2 mg via ORAL
  Filled 2018-04-16: qty 4

## 2018-04-16 MED ORDER — METOCLOPRAMIDE HCL 5 MG/ML IJ SOLN: 5.0000 mg | Freq: Three times a day (TID) | INTRAMUSCULAR | Status: DC | PRN

## 2018-04-16 MED ORDER — METHOCARBAMOL 500 MG PO TABS
500.0000 mg | ORAL_TABLET | Freq: Four times a day (QID) | ORAL | Status: DC | PRN
  Administered 2018-04-16 – 2018-04-17 (×3): 500 mg via ORAL
  Filled 2018-04-16 (×2): qty 1

## 2018-04-16 MED ORDER — METHOCARBAMOL 1000 MG/10ML IJ SOLN
500.0000 mg | Freq: Four times a day (QID) | INTRAVENOUS | Status: DC | PRN
  Filled 2018-04-16: qty 5

## 2018-04-16 MED ORDER — CEFAZOLIN SODIUM-DEXTROSE 2-4 GM/100ML-% IV SOLN
2.0000 g | INTRAVENOUS | Status: AC
  Administered 2018-04-16: 2 g via INTRAVENOUS
  Filled 2018-04-16: qty 100

## 2018-04-16 MED ORDER — METOCLOPRAMIDE HCL 5 MG PO TABS: 5.0000 mg | ORAL_TABLET | Freq: Three times a day (TID) | ORAL | Status: DC | PRN

## 2018-04-16 MED ORDER — BISACODYL 10 MG RE SUPP: 10.0000 mg | Freq: Every day | RECTAL | Status: DC | PRN

## 2018-04-16 MED ORDER — ZOLPIDEM TARTRATE 5 MG PO TABS
10.0000 mg | ORAL_TABLET | Freq: Every evening | ORAL | Status: DC | PRN
  Administered 2018-04-16: 10 mg via ORAL
  Filled 2018-04-16: qty 2

## 2018-04-16 MED ORDER — ARTIFICIAL TEARS OPHTHALMIC OINT
TOPICAL_OINTMENT | OPHTHALMIC | Status: AC
  Filled 2018-04-16: qty 3.5

## 2018-04-16 MED ORDER — OXYCODONE HCL 5 MG PO TABS: 5.0000 mg | ORAL_TABLET | Freq: Once | ORAL | Status: DC | PRN

## 2018-04-16 MED ORDER — LIDOCAINE 2% (20 MG/ML) 5 ML SYRINGE
INTRAMUSCULAR | Status: AC
  Filled 2018-04-16: qty 5

## 2018-04-16 MED ORDER — EPHEDRINE 5 MG/ML INJ
INTRAVENOUS | Status: AC
  Filled 2018-04-16: qty 10

## 2018-04-16 MED ORDER — HYDROMORPHONE HCL 1 MG/ML IJ SOLN
0.2500 mg | INTRAMUSCULAR | Status: DC | PRN
  Administered 2018-04-16 (×2): 0.25 mg via INTRAVENOUS
  Administered 2018-04-16: 0.5 mg via INTRAVENOUS

## 2018-04-16 MED ORDER — ONDANSETRON HCL 4 MG PO TABS: 4.0000 mg | ORAL_TABLET | Freq: Four times a day (QID) | ORAL | Status: DC | PRN

## 2018-04-16 MED ORDER — ONDANSETRON HCL 4 MG/2ML IJ SOLN: 4.0000 mg | Freq: Four times a day (QID) | INTRAMUSCULAR | Status: DC | PRN

## 2018-04-16 MED ORDER — ALLOPURINOL 100 MG PO TABS
100.0000 mg | ORAL_TABLET | Freq: Every day | ORAL | Status: DC
  Administered 2018-04-17: 100 mg via ORAL
  Filled 2018-04-16: qty 1

## 2018-04-16 MED ORDER — ATORVASTATIN CALCIUM 10 MG PO TABS
10.0000 mg | ORAL_TABLET | Freq: Every evening | ORAL | Status: DC
  Administered 2018-04-16: 10 mg via ORAL
  Filled 2018-04-16: qty 1

## 2018-04-16 MED ORDER — OXYCODONE HCL 5 MG/5ML PO SOLN: 5.0000 mg | Freq: Once | ORAL | Status: DC | PRN

## 2018-04-16 MED ORDER — OXYCODONE HCL 5 MG PO TABS
ORAL_TABLET | ORAL | Status: AC
  Administered 2018-04-16: 10 mg via ORAL
  Filled 2018-04-16: qty 2

## 2018-04-16 MED ORDER — GABAPENTIN 300 MG PO CAPS
300.0000 mg | ORAL_CAPSULE | Freq: Two times a day (BID) | ORAL | Status: DC
  Administered 2018-04-16 – 2018-04-17 (×2): 300 mg via ORAL
  Filled 2018-04-16 (×2): qty 1

## 2018-04-16 MED ORDER — SODIUM CHLORIDE 0.9 % IV SOLN
INTRAVENOUS | Status: DC
  Administered 2018-04-16: 17:00:00 via INTRAVENOUS

## 2018-04-16 MED ORDER — EPHEDRINE SULFATE-NACL 50-0.9 MG/10ML-% IV SOSY
PREFILLED_SYRINGE | INTRAVENOUS | Status: DC | PRN
  Administered 2018-04-16: 5 mg via INTRAVENOUS
  Administered 2018-04-16: 10 mg via INTRAVENOUS
  Administered 2018-04-16: 15 mg via INTRAVENOUS
  Administered 2018-04-16 (×2): 10 mg via INTRAVENOUS

## 2018-04-16 SURGICAL SUPPLY — 40 items
BIT DRILL 3 CALIBR ANKLE (DRILL) ×3 IMPLANT
BIT DRILL CANN CALIB 5.5 (BIT) ×3 IMPLANT
BLADE AVERAGE 25MMX9MM (BLADE) ×1
BLADE AVERAGE 25X9 (BLADE) ×2 IMPLANT
BLADE SAW SGTL 83.5X18.5 (BLADE) ×3 IMPLANT
BNDG ESMARK 4X9 LF (GAUZE/BANDAGES/DRESSINGS) ×3 IMPLANT
COVER MAYO STAND STRL (DRAPES) ×3 IMPLANT
COVER SURGICAL LIGHT HANDLE (MISCELLANEOUS) ×3 IMPLANT
DRAPE OEC MINIVIEW 54X84 (DRAPES) ×3 IMPLANT
DRESSING PREVENA PLUS CUSTOM (GAUZE/BANDAGES/DRESSINGS) ×1 IMPLANT
DRSG PREVENA PLUS CUSTOM (GAUZE/BANDAGES/DRESSINGS) ×3
DURAPREP 26ML APPLICATOR (WOUND CARE) ×3 IMPLANT
ELECT REM PT RETURN 9FT ADLT (ELECTROSURGICAL) ×3
ELECTRODE REM PT RTRN 9FT ADLT (ELECTROSURGICAL) ×1 IMPLANT
GAUZE SPONGE 4X4 12PLY STRL LF (GAUZE/BANDAGES/DRESSINGS) ×3 IMPLANT
GLOVE BIOGEL PI IND STRL 9 (GLOVE) ×1 IMPLANT
GLOVE BIOGEL PI INDICATOR 9 (GLOVE) ×2
GLOVE SURG ORTHO 9.0 STRL STRW (GLOVE) ×3 IMPLANT
GOWN STRL REUS W/ TWL LRG LVL3 (GOWN DISPOSABLE) ×1 IMPLANT
GOWN STRL REUS W/ TWL XL LVL3 (GOWN DISPOSABLE) ×1 IMPLANT
GOWN STRL REUS W/TWL LRG LVL3 (GOWN DISPOSABLE) ×2
GOWN STRL REUS W/TWL XL LVL3 (GOWN DISPOSABLE) ×2
KIT BASIN OR (CUSTOM PROCEDURE TRAY) ×3 IMPLANT
KIT DRSG PREVENA PLUS 7DAY 125 (MISCELLANEOUS) ×3 IMPLANT
KIT TURNOVER KIT B (KITS) ×3 IMPLANT
NS IRRIG 1000ML POUR BTL (IV SOLUTION) ×3 IMPLANT
PACK ORTHO EXTREMITY (CUSTOM PROCEDURE TRAY) ×3 IMPLANT
PAD ARMBOARD 7.5X6 YLW CONV (MISCELLANEOUS) ×6 IMPLANT
PLATE ANKLE FUSION TT RT (Plate) ×3 IMPLANT
SCREW BONE TI LP 4.5X36 (Screw) ×6 IMPLANT
SCREW BONE TI LP 4.5X40 (Screw) ×3 IMPLANT
SCREW BONE TI LP 4.5X46 (Screw) ×3 IMPLANT
SCREW BONE TI LP 5.5X50 (Screw) ×3 IMPLANT
SCREW LOW PROFILE 4.5X32 (Screw) ×3 IMPLANT
SUCTION FRAZIER HANDLE 10FR (MISCELLANEOUS) ×2
SUCTION TUBE FRAZIER 10FR DISP (MISCELLANEOUS) ×1 IMPLANT
SUT ETHILON 2 0 PSLX (SUTURE) ×9 IMPLANT
TUBE CONNECTING 12'X1/4 (SUCTIONS) ×1
TUBE CONNECTING 12X1/4 (SUCTIONS) ×2 IMPLANT
YANKAUER SUCT BULB TIP NO VENT (SUCTIONS) ×3 IMPLANT

## 2018-04-16 NOTE — Anesthesia Procedure Notes (Signed)
Anesthesia Regional Block: Popliteal block   Pre-Anesthetic Checklist: ,, timeout performed, Correct Patient, Correct Site, Correct Laterality, Correct Procedure, Correct Position, site marked, Risks and benefits discussed,  Surgical consent,  Pre-op evaluation,  At surgeon's request and post-op pain management  Laterality: Right  Prep: chloraprep       Needles:  Injection technique: Single-shot  Needle Type: Stimiplex     Needle Length: 9cm  Needle Gauge: 21     Additional Needles:   Procedures:,,,, ultrasound used (permanent image in chart),,,,  Narrative:  Start time: 04/16/2018 9:02 AM End time: 04/16/2018 9:07 AM Injection made incrementally with aspirations every 5 mL.  Performed by: Personally  Anesthesiologist: Lowella CurbMiller, Warren Ray, MD

## 2018-04-16 NOTE — Transfer of Care (Signed)
Immediate Anesthesia Transfer of Care Note  Patient: Garrett Ferrell  Procedure(s) Performed: REVISION ANKLE FUSION, OPEN, ANTERIOR APPROACH (Right )  Patient Location: PACU  Anesthesia Type:General  Level of Consciousness: drowsy and patient cooperative  Airway & Oxygen Therapy: Patient Spontanous Breathing  Post-op Assessment: Report given to RN  Post vital signs: Reviewed and stable  Last Vitals:  Vitals Value Taken Time  BP    Temp    Pulse    Resp 11 04/16/2018 11:11 AM  SpO2    Vitals shown include unvalidated device data.  Last Pain:  Vitals:   04/16/18 0802  TempSrc:   PainSc: 5       Patients Stated Pain Goal: 2 (04/16/18 0802)  Complications: No apparent anesthesia complications

## 2018-04-16 NOTE — Op Note (Signed)
04/16/2018  11:18 AM  PATIENT:  Garrett Ferrell    PRE-OPERATIVE DIAGNOSIS:  Failed Arthrodesis Right Ankle  POST-OPERATIVE DIAGNOSIS:  Same  PROCEDURE:  REVISION ANKLE FUSION, OPEN, ANTERIOR APPROACH, with fusion of the tibia talus and calcaneus. Application of Praveena wound VAC. C arm fluoroscopy to verify alignment.  SURGEON:  Nadara MustardMarcus V Clancey Welton, MD  PHYSICIAN ASSISTANT:None ANESTHESIA:   General  PREOPERATIVE INDICATIONS:  Garrett Ferrell is a  57 y.o. male with a diagnosis of Failed Arthrodesis Right Ankle who failed conservative measures and elected for surgical management.    The risks benefits and alternatives were discussed with the patient preoperatively including but not limited to the risks of infection, bleeding, nerve injury, cardiopulmonary complications, the need for revision surgery, among others, and the patient was willing to proceed.  OPERATIVE IMPLANTS: Arthrex anterior tibial talar calcaneal fusion plate  @ENCIMAGES @  OPERATIVE FINDINGS: Fibrous union of the ankle joint.  OPERATIVE PROCEDURE: Patient was brought the operating room after undergoing a popliteal block he then underwent a general anesthetic.  After adequate levels of anesthesia were obtained patient's right lower extremity was prepped using DuraPrep draped into a sterile field a timeout was called.  An anterior incision was made between the anterior tibial tendon and the EHL tendon.  This was carried down to bone subperiosteal dissection was used to carry the dissection down to the tibial talar joint.  Patient had movement and fibrous union of the tibiotalar joint.  2 incisions were made medial and laterally and the fusion screws were removed that were buried in the bone.  These were removed without complications.  A oscillating saw was then used to debride the tibial talar joint of the fibrous bone and lamina retractor was placed further debridement was performed and rondure was used to remove further fibrinous  tissue.  This was debrided back to bleeding viable cancellus bone.  The ankle was reduced at 90 degrees and the Arthrex anterior plate was applied this was locked distally with 2 compression screws.  Using lag screw technique a black screw was then used to compress the fracture through the oblique screw proximally.  2 compression screws were then added proximally and 2 more compression screws distally.  The fusion was carried through the tibia talus and calcaneus to stabilize the hindfoot.  C-arm fluoroscopy verified alignment of the tibiotalar calcaneal fusion.  The ankle was at 90 degrees.  The wound was irrigated with normal saline hemostasis was obtained.  The skin was closed using 2-0 nylon.  A Praveena customizable incisional wound VAC was applied to cover both medial and lateral incisions as well as the anterior incision.  This had a good suction fit.  Patient was extubated taken the PACU in stable condition.   DISCHARGE PLANNING:  Antibiotic duration: 24-hour postoperative antibiotics  Weightbearing: Nonweightbearing on the right  Pain medication: Patient will receive prescription for Percocet  Dressing care/ Wound VAC: Continue wound VAC for 1 week  Ambulatory devices: Walker or crutches  Discharge to: Home after 23-hour observation  Follow-up: In the office 1 week post operative.

## 2018-04-16 NOTE — Anesthesia Postprocedure Evaluation (Signed)
Anesthesia Post Note  Patient: Lyndon CodeJerome Haan  Procedure(s) Performed: REVISION ANKLE FUSION, OPEN, ANTERIOR APPROACH (Right )     Patient location during evaluation: PACU Anesthesia Type: General Level of consciousness: awake and alert Pain management: pain level controlled Vital Signs Assessment: post-procedure vital signs reviewed and stable Respiratory status: spontaneous breathing, nonlabored ventilation and respiratory function stable Cardiovascular status: blood pressure returned to baseline and stable Postop Assessment: no apparent nausea or vomiting Anesthetic complications: no    Last Vitals:  Vitals:   04/16/18 1158 04/16/18 1213  BP: 106/86 117/90  Pulse: 63 83  Resp: 12 16  Temp: 36.5 C 36.5 C  SpO2: 98% 97%    Last Pain:  Vitals:   04/16/18 1233  TempSrc:   PainSc: 4                  Lowella CurbWarren Ray Jayline Kilburg

## 2018-04-16 NOTE — Discharge Instructions (Signed)

## 2018-04-16 NOTE — Progress Notes (Signed)
Ortho Tech paged for [employer]Cam Walker

## 2018-04-16 NOTE — Evaluation (Signed)
Physical Therapy Evaluation Patient Details Name: Garrett Ferrell MRN: [id number] DOB: Jun 17, 1960 Today's Date: 04/16/2018   History of Present Illness  Pt is a 57 y/o male s/p revision of R ankle fusion. PMH includes DVT, a fib, bipolar disorder, HTN, substance abuse, PE, schizophrenia, seizures, and R ankle fusion.   Clinical Impression  Pt is s/p surgery above with deficits below. Pt limited secondary to pain this session and only able to take a few hop steps at EOB using RW. Able to maintain NWB precautions on RLE throughout mobility. Required min guard A throughout. Pt requesting to try crutches during next session as that is what he used following previous surgery. Anticipate pt will progress well and will not require follow up PT. Will continue to follow acutely to maximize functional mobility independence and safety.      Follow Up Recommendations No PT follow up;Supervision for mobility/OOB    Equipment Recommendations  Other (comment)(TBD pending progress )    Recommendations for Other Services       Precautions / Restrictions Precautions Precautions: Fall Required Braces or Orthoses: Other Brace/Splint Other Brace/Splint: CAM walker boot Restrictions Weight Bearing Restrictions: Yes RLE Weight Bearing: Non weight bearing      Mobility  Bed Mobility Overal bed mobility: Needs Assistance Bed Mobility: Sit to Supine       Sit to supine: Supervision   General bed mobility comments: Pt sitting at EOB upon entry. Required supervision for safety and line management to return to supine.   Transfers Overall transfer level: Needs assistance Equipment used: Rolling walker (2 wheeled) Transfers: Sit to/from Stand Sit to Stand: Min guard;From elevated surface         General transfer comment: Min guard for steadying assist from elevated surface height.   Ambulation/Gait Ambulation/Gait assistance: Min guard   Assistive device: Rolling walker (2 wheeled)   Gait  velocity: Decreased    General Gait Details: Pt performing hop to side steps at EOB using RW for repositioning in bed. Min guard for safety. Further mobility deferred secondary to pain.   Stairs            Wheelchair Mobility    Modified Rankin (Stroke Patients Only)       Balance Overall balance assessment: Needs assistance Sitting-balance support: No upper extremity supported;Feet supported Sitting balance-Leahy Scale: Good     Standing balance support: Bilateral upper extremity supported;During functional activity Standing balance-Leahy Scale: Poor Standing balance comment: Reliant on BUE support                              Pertinent Vitals/Pain Pain Assessment: 0-10 Pain Score: 10-Worst pain ever Pain Location: R ankle  Pain Descriptors / Indicators: Operative site guarding;Aching Pain Intervention(s): Monitored during session;Limited activity within patient's tolerance;Repositioned    Home Living Family/patient expects to be discharged to:: Private residence Living Arrangements: Spouse/significant other Available Help at Discharge: Family;Available 24 hours/day Type of Home: House Home Access: Level entry     Home Layout: One level Home Equipment: Crutches      Prior Function Level of Independence: Independent               Hand Dominance   Dominant Hand: Right    Extremity/Trunk Assessment   Upper Extremity Assessment Upper Extremity Assessment: Defer to OT evaluation    Lower Extremity Assessment Lower Extremity Assessment: RLE deficits/detail RLE Deficits / Details: RLE wrapped with wound vac attached. Deficits consistent with  post op pain and weakness.     Cervical / Trunk Assessment Cervical / Trunk Assessment: Normal  Communication   Communication: No difficulties  Cognition Arousal/Alertness: Suspect due to medications Behavior During Therapy: WFL for tasks assessed/performed Overall Cognitive Status: No  family/caregiver present to determine baseline cognitive functioning                                 General Comments: Pt with flat affect throughout session. Somewhat slowed processing. Per previous notes, pt with cognitive deficits.       General Comments      Exercises     Assessment/Plan    PT Assessment Patient needs continued PT services  PT Problem List Decreased balance;Decreased activity tolerance;Decreased mobility;Decreased cognition;Decreased knowledge of precautions;Pain       PT Treatment Interventions DME instruction;Gait training;Functional mobility training;Stair training;Therapeutic activities;Therapeutic exercise;Balance training;Patient/family education    PT Goals (Current goals can be found in the Care Plan section)  Acute Rehab PT Goals Patient Stated Goal: to decrease pain  PT Goal Formulation: With patient Time For Goal Achievement: 04/30/18 Potential to Achieve Goals: Good    Frequency Min 5X/week   Barriers to discharge        Co-evaluation               AM-PAC PT "6 Clicks" Daily Activity  Outcome Measure Difficulty turning over in bed (including adjusting bedclothes, sheets and blankets)?: A Little Difficulty moving from lying on back to sitting on the side of the bed? : A Little Difficulty sitting down on and standing up from a chair with arms (e.g., wheelchair, bedside commode, etc,.)?: Unable Help needed moving to and from a bed to chair (including a wheelchair)?: A Little Help needed walking in hospital room?: A Little Help needed climbing 3-5 steps with a railing? : A Lot 6 Click Score: 15    End of Session Equipment Utilized During Treatment: Gait belt Activity Tolerance: Patient limited by pain Patient left: in bed;with call bell/phone within reach;with nursing/sitter in room Nurse Communication: Mobility status PT Visit Diagnosis: Other abnormalities of gait and mobility (R26.89);Pain Pain - Right/Left:  Right Pain - part of body: Ankle and joints of foot    Time: 1535-1553 PT Time Calculation (min) (ACUTE ONLY): 18 min   Charges:   PT Evaluation $PT Eval Low Complexity: 1 Low          Gladys DammeBrittany Kellis Topete, PT, DPT  Acute Rehabilitation Services  Pager: [phone] Office: [phone]   Lehman PromBrittany S Yicel Shannon 04/16/2018, 4:31 PM

## 2018-04-16 NOTE — Anesthesia Procedure Notes (Signed)
Procedure Name: LMA Insertion Date/Time: 04/16/2018 9:55 AM Performed by: De Nurseennie, Kamilah Correia E, CRNA Pre-anesthesia Checklist: Patient identified, Emergency Drugs available, Suction available and Patient being monitored Patient Re-evaluated:Patient Re-evaluated prior to induction Oxygen Delivery Method: Circle System Utilized Preoxygenation: Pre-oxygenation with 100% oxygen Induction Type: IV induction Ventilation: Mask ventilation without difficulty LMA: LMA inserted LMA Size: 5.0 Number of attempts: 1 Placement Confirmation: positive ETCO2 Tube secured with: Tape Dental Injury: Teeth and Oropharynx as per pre-operative assessment

## 2018-04-16 NOTE — H&P (Signed)
Garrett Ferrell is an 57 y.o. male.   Chief Complaint: Right ankle pain. 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  Patient has undergone subtalar fusion on the right as well as ankle fusion on the right.  Patient has developed a fibrous nonunion and presents at this time for revision fusion of the right ankle.Patient complains of medial joint line pain on the right. Pain is excruiating. Is unable to wait until his surgery next week to have relief of his pain.  Patient states he will not wear the fracture boot.  Past Medical History:  Diagnosis Date  . Anemia    takes Iron pill daily  . Anxiety    takes Xanax daily as needed  . Arthritis   . Atrial fibrillation (HCC)    takes Eliquis daily. Last dose will be 10/03/16  . Bipolar disorder (HCC)   . Depression    takes Risperdal daily  . DVT (deep venous thrombosis) (HCC)    both legs in 2016  . Dyspnea    when laying on back, with extertion  . Dysrhythmia    Afib  . Gout    takes Allopurinol daily  . Head injury   . Headache    occasionally  . History of hiatal hernia   . History of shingles 2010  . Hyperlipidemia    takes Atorvastatin daily  . Hyperlipidemia    takes Atorvastatin daily  . Hypertension    takes Metoprolol daily  . Insomnia    takes Ambien and Trazodone nightly  . Joint pain   . Joint swelling    right ankle  . Memory impairment   . 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 Nocturia   . Pulmonary embolism (HCC)   . Schizophrenia (HCC)   . Seizures (HCC)    last one in 2002    Past Surgical History:  Procedure Laterality Date  . ANKLE ARTHROSCOPY Right 04/20/2016   Procedure: ANKLE ARTHROSCOPY;  Surgeon: Nadara MustardMarcus V Duda, MD;  [facility];  Service: Orthopedics;  Laterality: Right;  . ANKLE ARTHROSCOPY WITH FUSION Right 10/05/2016   Procedure: RIGHT ANKLE ARTHROSCOPIC FUSION;  Surgeon: Nadara Mustarduda, Marcus V, MD;  [facility];  Service:  Orthopedics;  Laterality: Right;  . ANKLE FUSION Right 04/20/2016   Procedure: Right Posterior Arthroscopic Subtalar Arthrodesis;  Surgeon: Nadara MustardMarcus V Duda, MD;  [facility];  Service: Orthopedics;  Laterality: Right;  . COLONOSCOPY WITH PROPOFOL N/A 11/27/2016   Procedure: COLONOSCOPY WITH PROPOFOL;  Surgeon: Charolett BumpersJohnson, Martin K, MD;  Location: [facility] ENDOSCOPY;  Service: Endoscopy;  Laterality: N/A;  . HERNIA REPAIR     1986-umbilical  . RIGHT HEART [facility]  [facility]/PE    Family History  Problem Relation Age of Onset  . Heart Problems Mother   . Heart attack Father        age 8040s   Social History:  reports that he has never smoked. He has never used smokeless tobacco. He reports that he drinks alcohol. He reports that he has current or past drug history.  Allergies: No Known Allergies  No medications prior to admission.    No results found for this or any previous visit (from the past 48 hour(s)). No results found.  Review of Systems  All other systems reviewed and are negative.   There were no vitals taken for this visit. Physical Exam  Patient  is alert, oriented, no adenopathy, well-dressed, normal affect, normal respiratory effort. Examination patient has good pulses there is no redness no swelling no cellulitis his foot is plantigrade.  He is tender to palpation of medial joint line of the right ankle.  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  The sinus Tarsi is nontender to palpation the lateral tibiotalar joint is nontender to palpation. Assessment/Plan Assessment: Fibrous nonunion right ankle fusion.  Plan: We will plan for takedown of the fibrous nonunion anterior approach removal of retained hardware with revision fusion.  Risks and benefits were discussed including infection neurovascular injury persistent pain need for additional surgery.  Patient states he understands wished to proceed at this  time.  Nadara Mustard, MD 04/16/2018, 7:27 AM

## 2018-04-17 DIAGNOSIS — T84098A Other mechanical complication of other internal joint prosthesis, initial encounter: Secondary | ICD-10-CM | POA: Diagnosis not present

## 2018-04-17 MED ORDER — OXYCODONE-ACETAMINOPHEN 5-325 MG PO TABS: 1.0000 | ORAL_TABLET | ORAL | 0 refills | Status: DC | PRN

## 2018-04-17 NOTE — Progress Notes (Addendum)
Physical Therapy Treatment Patient Details Name: Garrett Ferrell MRN: [id number] DOB: [birth year] Today's Date: 04/17/2018    History of Present Illness Pt is a 57 y/o male s/p revision of R ankle fusion. PMH includes DVT, a fib, bipolar disorder, HTN, substance abuse, PE, schizophrenia, seizures, and R ankle fusion.     PT Comments    Patient seen for mobility progression. This session focused on gait training with crutches and RW. Pt is unable to maintain NWB status with use of crutches and pt educated to use RW upon d/c. Pt is able to ambulate ~50 ft with RW and min guard assist and limited by pain. Suspect that when pain decreases pt will be able to make more progress. Continue to progress as tolerated.    Follow Up Recommendations  No PT follow up;Supervision for mobility/OOB     Equipment Recommendations  Rolling walker with 5" wheels; 3 in 1    Recommendations for Other Services       Precautions / Restrictions Precautions Precautions: Fall Required Braces or Orthoses: Other Brace/Splint Other Brace/Splint: CAM walker boot Restrictions Weight Bearing Restrictions: Yes RLE Weight Bearing: Non weight bearing    Mobility  Bed Mobility Overal bed mobility: Modified Independent Bed Mobility: Sit to Supine;Supine to Sit           General bed mobility comments: increased time and effort   Transfers Overall transfer level: Needs assistance Equipment used: Rolling walker (2 wheeled) Transfers: Sit to/from Stand Sit to Stand: Min guard;Min assist         General transfer comment: max cues for use of bilat crutches to stand; pt requires assistance to stand safely while maintaining NWB status with crutches and assist for safe eccentric loading; cues for hand placement   Ambulation/Gait Ambulation/Gait assistance: Min guard Gait Distance (Feet): 30 Feet Assistive device: Rolling walker (2 wheeled);Crutches Gait Pattern/deviations: Step-to pattern Gait velocity:  Decreased    General Gait Details: mod-max step by step cues required for safe use of bilat crutches; attempted 2 point gait with crutches however pt has too much difficulty maintaining NWB so began using RW for increased support; pt is much more steady and has better adherence to precautions with use of RW    Stairs             Wheelchair Mobility    Modified Rankin (Stroke Patients Only)       Balance Overall balance assessment: Needs assistance Sitting-balance support: No upper extremity supported;Feet supported Sitting balance-Leahy Scale: Good     Standing balance support: Bilateral upper extremity supported;During functional activity Standing balance-Leahy Scale: Poor Standing balance comment: Reliant on BUE support                             Cognition Arousal/Alertness: Awake/alert Behavior During Therapy: WFL for tasks assessed/performed Overall Cognitive Status: No family/caregiver present to determine baseline cognitive functioning                                 General Comments: Pt with flat affect throughout session. Somewhat slowed processing. Per previous notes, pt with cognitive deficits.       Exercises      General Comments        Pertinent Vitals/Pain Pain Assessment: Faces Faces Pain Scale: Hurts little more Pain Location: R ankle  Pain Descriptors / Indicators: Operative site guarding;Aching Pain Intervention(s): Monitored  during session;Limited activity within patient's tolerance;Repositioned;Premedicated before session    Home Living                      Prior Function            PT Goals (current goals can now be found in the care plan section) Progress towards PT goals: Progressing toward goals    Frequency    Min 5X/week      PT Plan Current plan remains appropriate    Co-evaluation              AM-PAC PT "6 Clicks" Daily Activity  Outcome Measure  Difficulty turning over  in bed (including adjusting bedclothes, sheets and blankets)?: A Little Difficulty moving from lying on back to sitting on the side of the bed? : A Little Difficulty sitting down on and standing up from a chair with arms (e.g., wheelchair, bedside commode, etc,.)?: Unable Help needed moving to and from a bed to chair (including a wheelchair)?: A Little Help needed walking in hospital room?: A Little Help needed climbing 3-5 steps with a railing? : A Lot 6 Click Score: 15    End of Session Equipment Utilized During Treatment: Gait belt Activity Tolerance: Patient limited by pain Patient left: in bed;with call bell/phone within reach Nurse Communication: Mobility status PT Visit Diagnosis: Other abnormalities of gait and mobility (R26.89);Pain Pain - Right/Left: Right Pain - part of body: Ankle and joints of foot     Time: 1110-1140 PT Time Calculation (min) (ACUTE ONLY): 30 min  Charges:  $Gait Training: 23-37 mins                     Erline LevineKellyn Genecis Veley, PTA Acute Rehabilitation Services Pager: [phone] Office: [phone]     Carolynne EdouardKellyn R Skyler Dusing 04/17/2018, 11:52 AM

## 2018-04-17 NOTE — Discharge Summary (Signed)
Discharge Diagnoses:  Active Problems:   Nonunion of subtalar arthrodesis   Nonunion of osteotomy site   Status post ankle fusion   Surgeries: Procedure(s): REVISION ANKLE FUSION, OPEN, ANTERIOR APPROACH on 04/16/2018    Consultants:   Discharged Condition: Improved  Hospital Course: Garrett Ferrell is an 57 y.o. male who was admitted 04/16/2018 with a chief complaint of nonunion right ankle fusion, with a final diagnosis of Failed Arthrodesis Right Ankle.  Patient was brought to the operating room on 04/16/2018 and underwent Procedure(s): REVISION ANKLE FUSION, OPEN, ANTERIOR APPROACH.    Patient was given perioperative antibiotics:  Anti-infectives (From admission, onward)   Start     Dose/Rate Route Frequency Ordered Stop   04/16/18 1545  ceFAZolin (ANCEF) IVPB 1 g/50 mL premix     1 g 100 mL/hr over 30 Minutes Intravenous Every 6 hours 04/16/18 1217 04/17/18 0439   04/16/18 0800  ceFAZolin (ANCEF) IVPB 2g/100 mL premix     2 g 200 mL/hr over 30 Minutes Intravenous On call to O.R. 04/16/18 1610 04/16/18 0945    .  Patient was given sequential compression devices, early ambulation, and aspirin for DVT prophylaxis.  Recent vital signs:  Patient Vitals for the past 24 hrs:  BP Temp Temp src Pulse Resp SpO2 Height Weight  04/17/18 0406 116/85 98.2 F (36.8 C) Oral (!) 102 20 97 % - -  04/17/18 0055 105/74 (!) 97.1 F (36.2 C) Oral 91 20 94 % - -  04/16/18 2104 120/90 98 F (36.7 C) Oral 76 20 100 % - -  04/16/18 1213 117/90 97.7 F (36.5 C) - 83 16 97 % - -  04/16/18 1158 106/86 97.7 F (36.5 C) - 63 12 98 % - -  04/16/18 1145 (!) 124/91 - - 82 16 98 % - -  04/16/18 1130 114/79 - - 71 15 97 % - -  04/16/18 1115 111/85 - - 79 16 98 % - -  04/16/18 1110 113/77 97.7 F (36.5 C) - 81 16 98 % - -  04/16/18 0910 100/69 - - 72 (!) 8 100 % - -  04/16/18 0902 100/78 - - 67 12 99 % - -  04/16/18 0757 (!) 116/91 97.7 F (36.5 C) Oral 72 18 99 % 6\' 1"  (1.854 m) 111.1 kg   .  Recent laboratory studies: No results found.  Discharge Medications:   Allergies as of 04/17/2018   No Known Allergies     Medication List    TAKE these medications   allopurinol 100 MG tablet Commonly known as:  ZYLOPRIM Take 100 mg by mouth daily.   ALPRAZolam 0.5 MG tablet Commonly known as:  XANAX Take 0.5 mg by mouth every 8 (eight) hours as needed for anxiety.   atorvastatin 10 MG tablet Commonly known as:  LIPITOR Take 10 mg by mouth every evening.   diltiazem 240 MG 24 hr capsule Commonly known as:  CARDIZEM CD Take 1 capsule (240 mg total) by mouth daily.   ELIQUIS 5 MG Tabs tablet Generic drug:  apixaban Take 5 mg by mouth 2 (two) times daily.   ferrous sulfate 325 (65 FE) MG EC tablet Take 325 mg by mouth daily with breakfast.   gabapentin 300 MG capsule Commonly known as:  NEURONTIN Take 1 capsule (300 mg total) by mouth 2 (two) times daily.   losartan 25 MG tablet Commonly known as:  COZAAR Take 25 mg by mouth daily.   meloxicam 15 MG tablet Commonly known  as:  MOBIC Take 15 mg by mouth daily.   methocarbamol 750 MG tablet Commonly known as:  ROBAXIN Take 1 tablet (750 mg total) by mouth every 8 (eight) hours as needed for muscle spasms.   multivitamin with minerals Tabs tablet Take 1 tablet by mouth daily.   oxyCODONE-acetaminophen 5-325 MG tablet Commonly known as:  PERCOCET/ROXICET Take 1 tablet by mouth every 4 (four) hours as needed for severe pain. What changed:    when to take this  reasons to take this   QUEtiapine 50 MG Tb24 24 hr tablet Commonly known as:  SEROQUEL XR Take 150 mg by mouth at bedtime.   risperiDONE 1 MG tablet Commonly known as:  RISPERDAL Take 1-2 tablets (1-2 mg total) by mouth 2 (two) times daily. Take 1tab (1mg ) in morning and 2 tabs (2mg )at night What changed:    when to take this  additional instructions   tiZANidine 2 MG tablet Commonly known as:  ZANAFLEX Take 2-4 mg by mouth every 8  (eight) hours as needed for muscle spasms.   trazodone 300 MG tablet Commonly known as:  DESYREL Take 300 mg by mouth at bedtime.   VIAGRA 100 MG tablet Generic drug:  sildenafil Take 100 mg by mouth daily as needed for erectile dysfunction.   zolpidem 10 MG tablet Commonly known as:  AMBIEN Take 1 tablet (10 mg total) by mouth at bedtime as needed for sleep. What changed:  when to take this            Discharge Care Instructions  (From admission, onward)         Start     Ordered   04/17/18 0000  Non weight bearing    Question Answer Comment  Laterality right   Extremity Lower      04/17/18 0700          Diagnostic Studies: No results found.  Patient benefited maximally from their hospital stay and there were no complications.     Disposition: Discharge disposition: 01-Home or Self Care      Discharge Instructions    Call MD / Call 911   Complete by:  As directed    If you experience chest pain or shortness of breath, CALL 911 and be transported to the hospital emergency room.  If you develope a fever above 101 F, pus (white drainage) or increased drainage or redness at the wound, or calf pain, call your surgeon's office.   Constipation Prevention   Complete by:  As directed    Drink plenty of fluids.  Prune juice may be helpful.  You may use a stool softener, such as Colace (over the counter) 100 mg twice a day.  Use MiraLax (over the counter) for constipation as needed.   Diet - low sodium heart healthy   Complete by:  As directed    Elevate operative extremity   Complete by:  As directed    Increase activity slowly as tolerated   Complete by:  As directed    Negative Pressure Wound Therapy - Incisional   Complete by:  As directed    Plug in to keep charged   Non weight bearing   Complete by:  As directed    Laterality:  right   Extremity:  Lower     Follow-up Information    Nadara Mustard, MD In 1 week.   Specialty:  Orthopedic  Surgery Contact information: [address] [place] [address] [phone]  Signed: Nadara MustardMarcus V Pheobe Sandiford 04/17/2018, 7:00 AM

## 2018-04-17 NOTE — Plan of Care (Signed)
  Problem: Health Behavior/Discharge Planning: Goal: Ability to manage health-related needs will improve Outcome: Completed/Met   Problem: Clinical Measurements: Goal: Ability to maintain clinical measurements within normal limits will improve Outcome: Completed/Met Goal: Will remain free from infection Outcome: Completed/Met Goal: Diagnostic test results will improve Outcome: Completed/Met Goal: Respiratory complications will improve Outcome: Completed/Met   Problem: Nutrition: Goal: Adequate nutrition will be maintained Outcome: Progressing

## 2018-04-17 NOTE — Plan of Care (Signed)
  Problem: Education: Goal: Knowledge of General Education information will improve Description Including pain rating scale, medication(s)/side effects and non-pharmacologic comfort measures Outcome: Progressing   Problem: Clinical Measurements: Goal: Ability to maintain clinical measurements within normal limits will improve Outcome: Progressing Goal: Will remain free from infection Outcome: Progressing   Problem: Activity: Goal: Risk for activity intolerance will decrease Outcome: Progressing   Problem: Nutrition: Goal: Adequate nutrition will be maintained Outcome: Progressing   Problem: Coping: Goal: Level of anxiety will decrease Outcome: Progressing   Problem: Elimination: Goal: Will not experience complications related to bowel motility Outcome: Progressing Goal: Will not experience complications related to urinary retention Outcome: Progressing   Problem: Pain Managment: Goal: General experience of comfort will improve Outcome: Progressing   Problem: Safety: Goal: Ability to remain free from injury will improve Outcome: Progressing   Problem: Skin Integrity: Goal: Risk for impaired skin integrity will decrease Outcome: Progressing   

## 2018-04-17 NOTE — Care Management Note (Signed)
Case Management Note  Patient Details  Name: [name] MRN: [id number] Date of Birth: 11/19/1960  Subjective/Objective:    S/p revision of right ankle fusion                Action/Plan: NCM spoke to pt and requesting RW and 3n1 bedside commode for home. Contacted [facility] to deliver to room prior to dc.   Expected Discharge Date:  04/17/18               Expected Discharge Plan:  Home/Self Care  In-House Referral:  NA  Discharge planning Services  CM Consult  Post Acute Care Choice:  NA Choice offered to:  NA  DME Arranged:  3-N-1, Walker rolling DME Agency:  [facility]  HH Arranged:  NA HH Agency:  NA  Status of Service:  Completed, signed off  If discussed at Long Length of Stay Meetings, dates discussed:    Additional Comments:  Elliot CousinShavis, Yazlin Ekblad Ellen, RN 04/17/2018, 12:42 PM

## 2018-04-21 ENCOUNTER — Encounter (HOSPITAL_COMMUNITY): Payer: Self-pay | Admitting: Orthopedic Surgery

## 2018-04-23 ENCOUNTER — Ambulatory Visit (INDEPENDENT_AMBULATORY_CARE_PROVIDER_SITE_OTHER): Payer: Medicaid Other | Admitting: Family

## 2018-04-23 ENCOUNTER — Encounter (INDEPENDENT_AMBULATORY_CARE_PROVIDER_SITE_OTHER): Payer: Self-pay | Admitting: Family

## 2018-04-23 VITALS — Ht 73.0 in | Wt 245.0 lb

## 2018-04-23 DIAGNOSIS — Z981 Arthrodesis status: Secondary | ICD-10-CM

## 2018-04-23 MED ORDER — OXYCODONE-ACETAMINOPHEN 5-325 MG PO TABS: 1.0000 | ORAL_TABLET | ORAL | 0 refills | Status: DC | PRN

## 2018-04-23 NOTE — Progress Notes (Signed)
Post-Op Visit Note   Patient: Garrett Ferrell           Date of Birth: 1960-07-09           MRN: [id number] Visit Date: 04/23/2018 PCP: Jackie Plum, MD  Chief Complaint:  Chief Complaint  Patient presents with  . Right Ankle - Routine Post Op    HPI:  HPI The patient is a 57 year old gentleman who is 1 week status post right ankle fusion.  Wound VAC removed today.  He is in a fracture boot using a walker for nonweightbearing. Ortho Exam On examination of the right ankle there is moderate swelling does have some blistering and hematoma to the anterior shin there is no weeping.  Incisions are intact no erythema no odor no drainage no sign of infection  Visit Diagnoses: No diagnosis found.  Plan: Stressed the importance of elevation and compression.  Begin daily Dial soap cleansing and dry dressings.  Nonweightbearing.  Follow-up in 2 weeks with three-view radiographs of the right ankle.  Follow-Up Instructions: No follow-ups on file.   Imaging: No results found.  Orders:  No orders of the defined types were placed in this encounter.  Meds ordered this encounter  Medications  . oxyCODONE-acetaminophen (PERCOCET/ROXICET) 5-325 MG tablet    Sig: Take 1 tablet by mouth every 4 (four) hours as needed for severe pain.    Dispense:  42 tablet    Refill:  0     PMFS History: Patient Active Problem List   Diagnosis Date Noted  . Status post ankle fusion 04/16/2018  . Nonunion of subtalar arthrodesis   . Nonunion of osteotomy site   . Back pain   . Rhabdomyolysis 08/16/2017  . Syncope 05/29/2017  . Bipolar disorder (HCC) 05/29/2017  . Schizophrenia (HCC) 05/29/2017  . TBI (traumatic brain injury) (HCC) 05/29/2017  . Unwitnessed fall 05/29/2017  . Alcohol abuse with intoxication (HCC) 05/29/2017  . Slurred speech 05/29/2017  . Chest pain 08/07/2016  . Cocaine use 08/07/2016  . Chronic musculoskeletal pain 08/07/2016  . CKD (chronic kidney disease) 08/07/2016  .  Depression with anxiety 08/07/2016  . Traumatic arthritis of right ankle 07/31/2016  . 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 Osteoarthritis of right subtalar joint   . Right ankle pain 01/24/2016  . Bilateral hip pain 01/03/2016  . Atrial fibrillation, chronic 11/18/2015  . Essential hypertension 11/18/2015  . Hyperlipidemia 11/18/2015  . DVT, bilateral lower limbs (HCC) 11/18/2015  . Pulmonary embolus (HCC) 11/18/2015   Past Medical History:  Diagnosis Date  . Anemia    takes Iron pill daily  . Anxiety    takes Xanax daily as needed  . Arthritis   . Atrial fibrillation (HCC)    takes Eliquis daily. Last dose will be 10/03/16  . Bipolar disorder (HCC)   . Depression    takes Risperdal daily  . DVT (deep venous thrombosis) (HCC) 2016   BLE  . Dyspnea    when laying on back, with extertion  . Dysrhythmia    Afib  . Gout    takes Allopurinol daily  . Head injury   . Headache    occasionally  . History of hiatal hernia   . History of shingles 2010  . Hyperlipidemia    takes Atorvastatin daily  . Hyperlipidemia    takes Atorvastatin daily  . Hypertension    takes Metoprolol daily  . Insomnia    takes Ambien and Trazodone nightly  . Joint pain   .  Joint swelling    right ankle  . Memory impairment   . 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 Nocturia   . Pulmonary embolism (HCC) 2016  . Schizophrenia (HCC)   . Seizures (HCC)    last one in 2002    Family History  Problem Relation Age of Onset  . Heart Problems Mother   . Heart attack Father        age 8140s    Past Surgical History:  Procedure Laterality Date  . ANKLE ARTHROSCOPY Right 04/20/2016   Procedure: ANKLE ARTHROSCOPY;  Surgeon: Nadara MustardMarcus V Duda, MD;  [facility];  Service: Orthopedics;  Laterality: Right;  . ANKLE ARTHROSCOPY WITH FUSION Right 10/05/2016   Procedure: RIGHT ANKLE ARTHROSCOPIC FUSION;  Surgeon: Nadara Mustarduda, Marcus V, MD;  [facility];  Service: Orthopedics;  Laterality: Right;    . ANKLE FUSION Right 04/20/2016   Procedure: Right Posterior Arthroscopic Subtalar Arthrodesis;  Surgeon: Nadara MustardMarcus V Duda, MD;  [facility];  Service: Orthopedics;  Laterality: Right;  . ANKLE FUSION Right 04/16/2018   Procedure: REVISION ANKLE FUSION, OPEN, ANTERIOR APPROACH;  Surgeon: Nadara Mustarduda, Marcus V, MD;  [facility];  Service: Orthopedics;  Laterality: Right;  . ANKLE SURGERY Right 04/16/2018   REVISION ANKLE FUSION, OPEN, ANTERIOR APPROACH  . CARDIAC CATHETERIZATION Right 2016   [facility]  . COLONOSCOPY WITH PROPOFOL N/A 11/27/2016   Procedure: COLONOSCOPY WITH PROPOFOL;  Surgeon: Charolett BumpersJohnson, Martin K, MD;  Location: [facility];  Service: Endoscopy;  Laterality: N/A;  . HERNIA REPAIR     1986-umbilical  . RIGHT HEART CATH  2016   [facility]   Social History   Occupational History  . Occupation: disabled  Tobacco Use  . Smoking status: Never Smoker  . Smokeless tobacco: Never Used  Substance and Sexual Activity  . Alcohol use: Yes    Comment: varies,  bringe drinker - per wife   . Drug use: Not Currently  . Sexual activity: Not on file

## 2018-04-28 ENCOUNTER — Telehealth (INDEPENDENT_AMBULATORY_CARE_PROVIDER_SITE_OTHER): Payer: Self-pay

## 2018-04-28 NOTE — Telephone Encounter (Signed)
Called pt and lm on vm to advise that [facility] advised that he was declining services. That they have had nursing and home health PT come out to the house and call to make appt and were told by pt's wife that pt is not ready for them to come out and they will call when ready for services. I advised that I need him to call me back htat the order is on hold for right now but we need to know what it is that he is anting to do. Dr. Lajoyce Cornersuda wanted to make sure that he had these services and specifically arranged for him tot have this done.

## 2018-04-29 LAB — COMP. METABOLIC PANEL (14)

## 2018-04-29 LAB — LIPID PANEL

## 2018-04-30 ENCOUNTER — Telehealth (INDEPENDENT_AMBULATORY_CARE_PROVIDER_SITE_OTHER): Payer: Self-pay | Admitting: Orthopedic Surgery

## 2018-04-30 NOTE — Telephone Encounter (Signed)
Will  [facility]  [phone]   Verbal orders  2 times a week for 2 week   1 time for 3 weeks

## 2018-05-05 NOTE — Telephone Encounter (Signed)
The number given was not a working number. Emailed Agricultural engineerTiffany Adams with [facility] and she took verbal order to see the pt.

## 2018-05-05 NOTE — Telephone Encounter (Signed)
[facility] called for therapy orders for the pt. See previous message.

## 2018-05-08 ENCOUNTER — Encounter (INDEPENDENT_AMBULATORY_CARE_PROVIDER_SITE_OTHER): Payer: Self-pay | Admitting: Orthopedic Surgery

## 2018-05-08 ENCOUNTER — Ambulatory Visit (INDEPENDENT_AMBULATORY_CARE_PROVIDER_SITE_OTHER): Payer: Medicaid Other | Admitting: Orthopedic Surgery

## 2018-05-08 ENCOUNTER — Ambulatory Visit (INDEPENDENT_AMBULATORY_CARE_PROVIDER_SITE_OTHER): Payer: Medicaid Other

## 2018-05-08 VITALS — Ht 73.0 in | Wt 245.0 lb

## 2018-05-08 DIAGNOSIS — Z981 Arthrodesis status: Secondary | ICD-10-CM

## 2018-05-08 MED ORDER — OXYCODONE-ACETAMINOPHEN 5-325 MG PO TABS: 1.0000 | ORAL_TABLET | ORAL | 0 refills | Status: DC | PRN

## 2018-05-09 ENCOUNTER — Encounter (INDEPENDENT_AMBULATORY_CARE_PROVIDER_SITE_OTHER): Payer: Self-pay | Admitting: Orthopedic Surgery

## 2018-05-09 NOTE — Progress Notes (Signed)
Office Visit Note   Patient: Garrett Ferrell           Date of Birth: [birth year]           MRN: [id number] Visit Date: 05/08/2018              Requested by: Jackie Plum, MD [address] [place], [place] [address] PCP: Jackie Plum, MD  Chief Complaint  Patient presents with  . Right Ankle - Routine Post Op      HPI: Patient is a 57 year old gentleman who presents in follow-up status post revision right ankle fusion he is about 3 weeks out.  Assessment & Plan: Visit Diagnoses:  1. Status post ankle fusion     Plan: The sutures were harvested patient is placed in a Dynaflex wrap and a fracture boot again the importance of elevation and nonweightbearing was discussed.  At follow-up if the swelling is down significantly we could place him in a short leg cast.  Follow-Up Instructions: Return in about 1 week (around 05/15/2018).   Ortho Exam  Patient is alert, oriented, no adenopathy, well-dressed, normal affect, normal respiratory effort. Examination patient has significant swelling of the right lower extremity there is no drainage no cellulitis no redness no signs of infection.  The sutures are harvested.  Patient's foot is plantigrade.  Discussed the importance of nonweightbearing and elevation.  Imaging: No results found. No images are attached to the encounter.  Labs: No results found for: HGBA1C, ESRSEDRATE, CRP, LABURIC, REPTSTATUS, GRAMSTAIN, CULT, LABORGA   Lab Results  Component Value Date   ALBUMIN CANCELED 04/24/2018   ALBUMIN 3.3 (L) 04/16/2018   ALBUMIN 3.0 (L) 08/19/2017    Body mass index is 32.32 kg/m.  Orders:  Orders Placed This Encounter  Procedures  . XR Ankle Complete Right   Meds ordered this encounter  Medications  . oxyCODONE-acetaminophen (PERCOCET/ROXICET) 5-325 MG tablet    Sig: Take 1 tablet by mouth every 4 (four) hours as needed for severe pain.    Dispense:  42 tablet    Refill:  0     Procedures: No  procedures performed  Clinical Data: No additional findings.  ROS:  All other systems negative, except as noted in the HPI. Review of Systems  Objective: Vital Signs: Ht 6\' 1"  (1.854 m)   Wt 245 lb (111.1 kg)   BMI 32.32 kg/m   Specialty Comments:  No specialty comments available.  PMFS History: Patient Active Problem List   Diagnosis Date Noted  . Status post ankle fusion 04/16/2018  . Nonunion of subtalar arthrodesis   . Nonunion of osteotomy site   . Back pain   . Rhabdomyolysis 08/16/2017  . Syncope 05/29/2017  . Bipolar disorder (HCC) 05/29/2017  . Schizophrenia (HCC) 05/29/2017  . TBI (traumatic brain injury) (HCC) 05/29/2017  . Unwitnessed fall 05/29/2017  . Alcohol abuse with intoxication (HCC) 05/29/2017  . Slurred speech 05/29/2017  . Chest pain 08/07/2016  . Cocaine use 08/07/2016  . Chronic musculoskeletal pain 08/07/2016  . CKD (chronic kidney disease) 08/07/2016  . Depression with anxiety 08/07/2016  . Traumatic arthritis of right ankle 07/31/2016  . 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 Osteoarthritis of right subtalar joint   . Right ankle pain 01/24/2016  . Bilateral hip pain 01/03/2016  . Atrial fibrillation, chronic 11/18/2015  . Essential hypertension 11/18/2015  . Hyperlipidemia 11/18/2015  . DVT, bilateral lower limbs (HCC) 11/18/2015  . Pulmonary embolus (HCC) 11/18/2015   Past Medical History:  Diagnosis Date  . Anemia    takes Iron pill daily  . Anxiety    takes Xanax daily as needed  . Arthritis   . Atrial fibrillation (HCC)    takes Eliquis daily. Last dose will be 10/03/16  . Bipolar disorder (HCC)   . Depression    takes Risperdal daily  . DVT (deep venous thrombosis) (HCC) 2016   BLE  . Dyspnea    when laying on back, with extertion  . Dysrhythmia    Afib  . Gout    takes Allopurinol daily  . Head injury   . Headache    occasionally  . History of hiatal hernia   . History of shingles 2010  .  Hyperlipidemia    takes Atorvastatin daily  . Hyperlipidemia    takes Atorvastatin daily  . Hypertension    takes Metoprolol daily  . Insomnia    takes Ambien and Trazodone nightly  . Joint pain   . Joint swelling    right ankle  . Memory impairment   . 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 Nocturia   . Pulmonary embolism (HCC) 2016  . Schizophrenia (HCC)   . Seizures (HCC)    last one in 2002    Family History  Problem Relation Age of Onset  . Heart Problems Mother   . Heart attack Father        age 8640s    Past Surgical History:  Procedure Laterality Date  . ANKLE ARTHROSCOPY Right 04/20/2016   Procedure: ANKLE ARTHROSCOPY;  Surgeon: Nadara MustardMarcus V Keela Rubert, MD;  [facility];  Service: Orthopedics;  Laterality: Right;  . ANKLE ARTHROSCOPY WITH FUSION Right 10/05/2016   Procedure: RIGHT ANKLE ARTHROSCOPIC FUSION;  Surgeon: Nadara Mustarduda, Callahan Peddie V, MD;  [facility];  Service: Orthopedics;  Laterality: Right;  . ANKLE FUSION Right 04/20/2016   Procedure: Right Posterior Arthroscopic Subtalar Arthrodesis;  Surgeon: Nadara MustardMarcus V Blonnie Maske, MD;  [facility];  Service: Orthopedics;  Laterality: Right;  . ANKLE FUSION Right 04/16/2018   Procedure: REVISION ANKLE FUSION, OPEN, ANTERIOR APPROACH;  Surgeon: Nadara Mustarduda, Shaunte Weissinger V, MD;  Location[facility];  Service: Orthopedics;  Laterality: Right;  . ANKLE SURGERY Right 04/16/2018   REVISION ANKLE FUSION, OPEN, ANTERIOR APPROACH  . CARDIAC CATHETERIZATION Right 2016   [facility] during his [facility]  . COLONOSCOPY WITH PROPOFOL N/A 11/27/2016   Procedure: COLONOSCOPY WITH PROPOFOL;  Surgeon: Charolett BumpersJohnson, Martin K, MD;  Location[facility];  Service: Endoscopy;  Laterality: N/A;  . HERNIA REPAIR     1986-umbilical  . RIGHT HEART CATH  2016   [facility] his [facility]   Social History   Occupational History  . Occupation: disabled  Tobacco Use  . Smoking status: Never Smoker  . Smokeless tobacco:  Never Used  Substance and Sexual Activity  . Alcohol use: Yes    Comment: varies,  bringe drinker - per wife   . Drug use: Not Currently  . Sexual activity: Not on file

## 2018-05-13 IMAGING — CR DG CHEST 2V
2 series · 2 of 2 positions shown · non-contrast
Comparison: August 07, 2016.

CLINICAL DATA: Preoperative evaluation for ankle surgery. History
of atrial fibrillation and hypertension

EXAM:
CHEST  2 VIEW

[w chest pa]
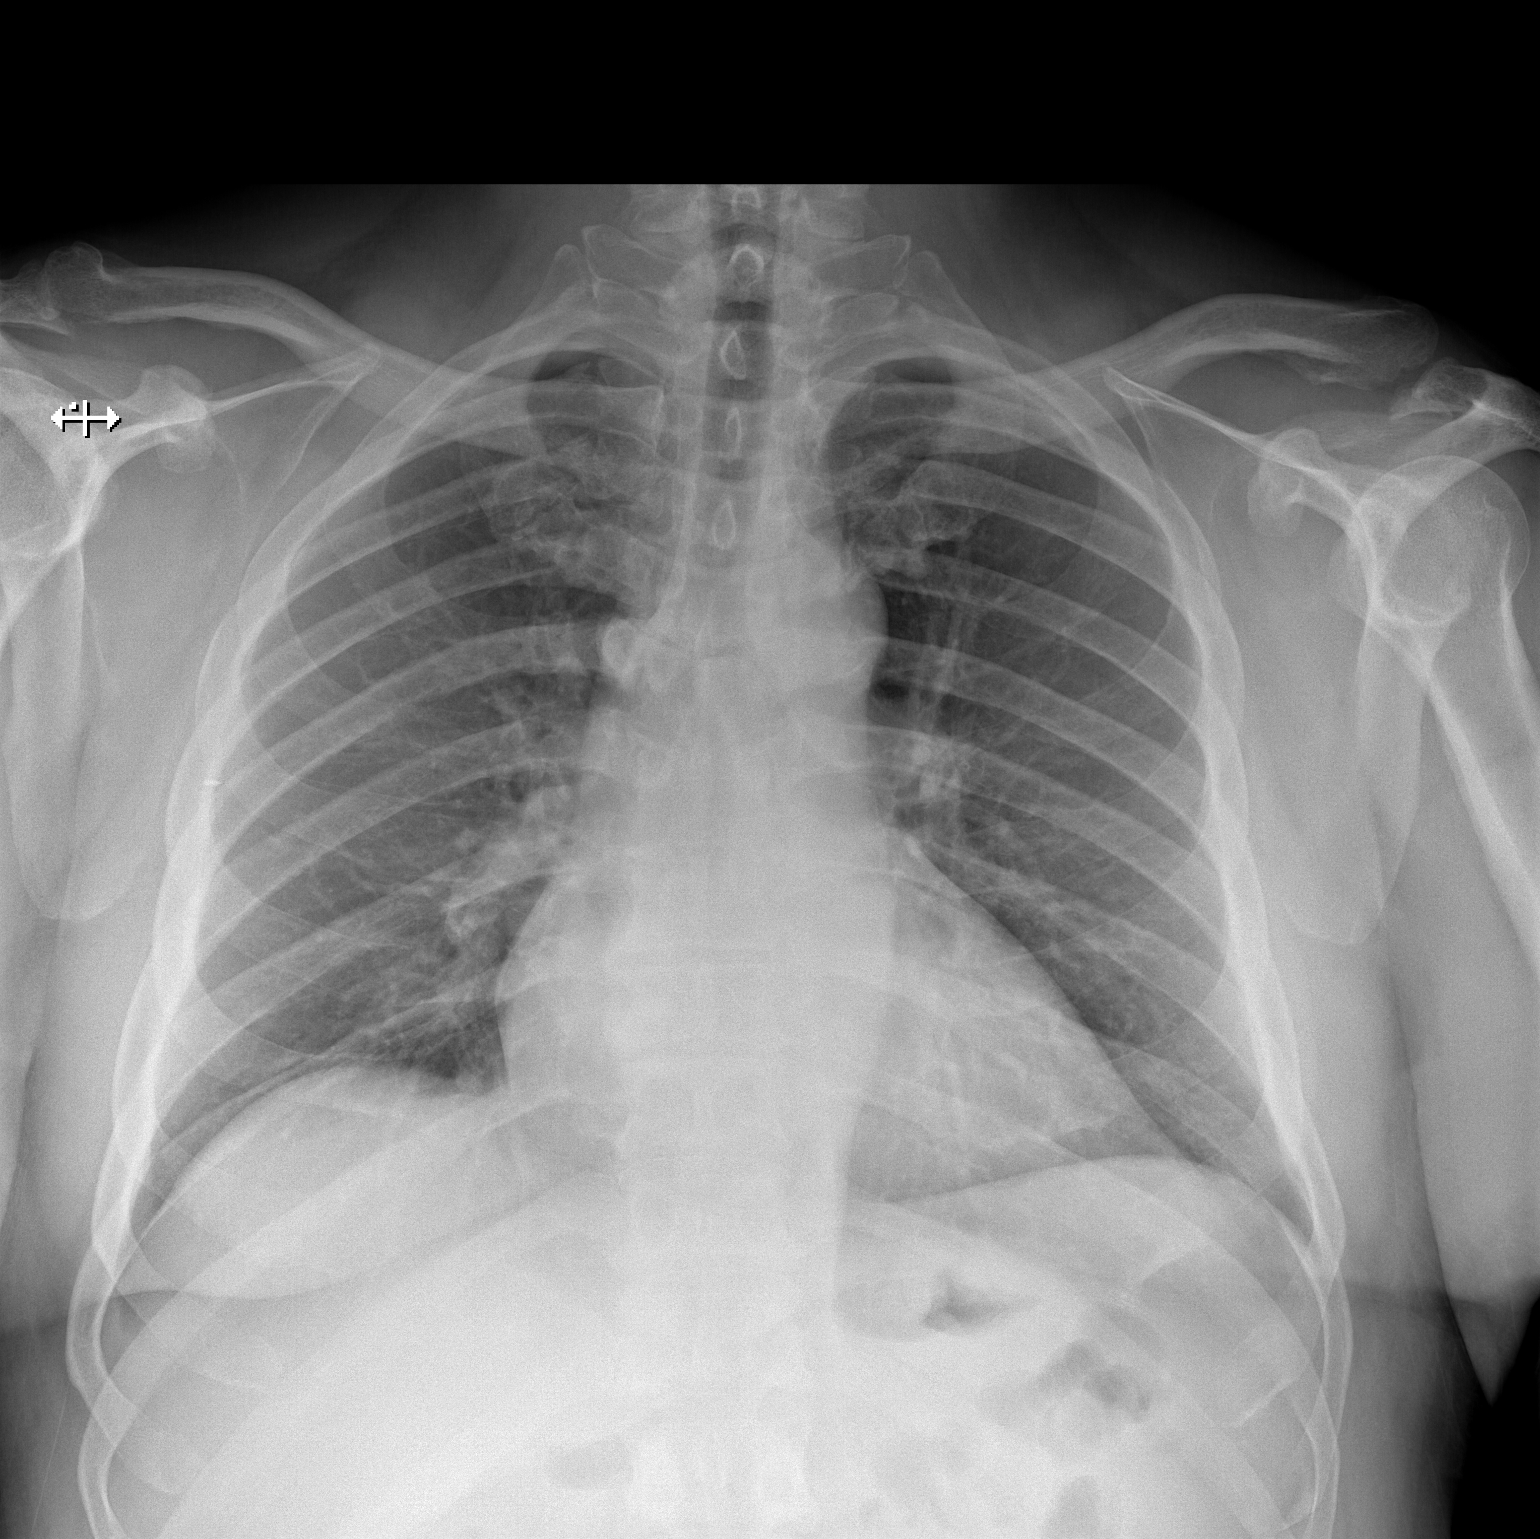

[w chest lat]
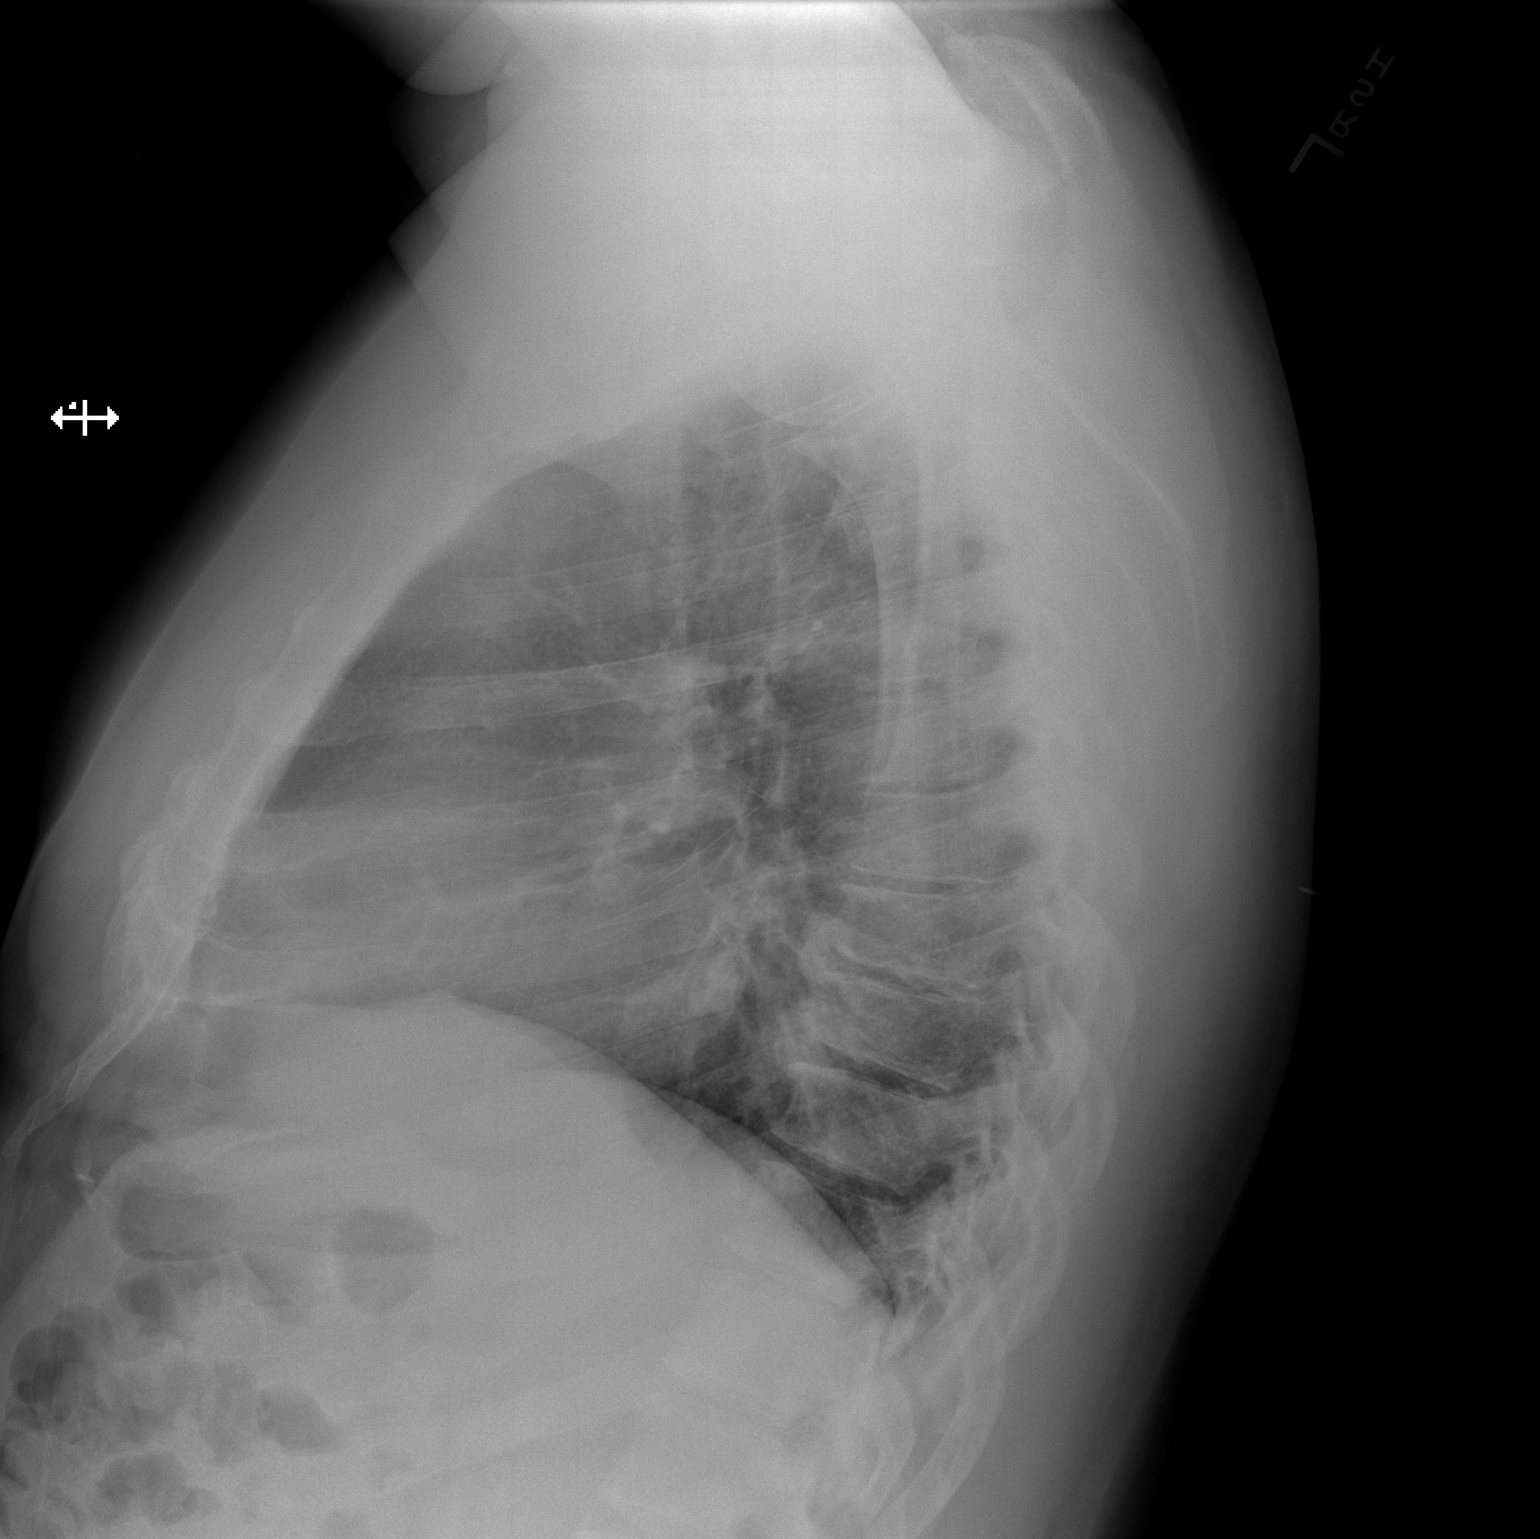

[2 of 2 positions shown; findings below may reference images not displayed]

FINDINGS: The lungs are clear. The heart size and pulmonary vascularity are
normal. No adenopathy. There is degenerative change in the thoracic
spine.
IMPRESSION: No edema or consolidation.

## 2018-05-15 ENCOUNTER — Ambulatory Visit (INDEPENDENT_AMBULATORY_CARE_PROVIDER_SITE_OTHER): Payer: Medicaid Other | Admitting: Orthopedic Surgery

## 2018-05-19 ENCOUNTER — Telehealth (INDEPENDENT_AMBULATORY_CARE_PROVIDER_SITE_OTHER): Payer: Self-pay | Admitting: Orthopedic Surgery

## 2018-05-19 NOTE — Telephone Encounter (Signed)
Called patient left message on voicemail to return call to schedule an appointment with Dr. Lajoyce Cornersuda per patient's request

## 2018-05-21 ENCOUNTER — Ambulatory Visit (INDEPENDENT_AMBULATORY_CARE_PROVIDER_SITE_OTHER): Payer: Self-pay

## 2018-05-21 ENCOUNTER — Encounter (INDEPENDENT_AMBULATORY_CARE_PROVIDER_SITE_OTHER): Payer: Self-pay | Admitting: Family

## 2018-05-21 ENCOUNTER — Ambulatory Visit (INDEPENDENT_AMBULATORY_CARE_PROVIDER_SITE_OTHER): Payer: Medicaid Other | Admitting: Family

## 2018-05-21 VITALS — Ht 73.0 in | Wt 245.0 lb

## 2018-05-21 DIAGNOSIS — Z981 Arthrodesis status: Secondary | ICD-10-CM

## 2018-05-21 MED ORDER — OXYCODONE-ACETAMINOPHEN 5-325 MG PO TABS: 1.0000 | ORAL_TABLET | Freq: Four times a day (QID) | ORAL | 0 refills | Status: DC | PRN

## 2018-05-21 NOTE — Progress Notes (Signed)
Office Visit Note   Patient: Garrett Ferrell           Date of Birth: 1960-11-12           MRN: [id number] Visit Date: 05/21/2018              Requested by: Jackie Plum, MD [address] [place], [place] [address] PCP: Jackie Plum, MD  Chief Complaint  Patient presents with  . Right Ankle - Routine Post Op    04/16/18 revision right ankle fusion       HPI: Patient is a 57 year old gentleman who presents in follow-up status post revision right ankle fusion on 04/16/18. Was placed in a dynaflex for swelling at last visit. Today is in a compression stocking. Swelling improved. Has a CAM boot and crutches. Endorses some weight bearing.   Assessment & Plan: Visit Diagnoses:  1. Status post ankle fusion     Plan: Again the importance of elevation and nonweightbearing was discussed. As swelling is improved will place him in a short leg cast today.   Follow-Up Instructions: Return in about 15 days (around 06/05/2018).   Ortho Exam  Patient is alert, oriented, no adenopathy, well-dressed, normal affect, normal respiratory effort. Examination patient has significantly improved swelling of the right lower extremity. Incision is well healed. there is no drainage no cellulitis no redness no signs of infection.  Patient's foot is plantigrade.    Discussed the importance of nonweightbearing and elevation.  Imaging: No results found. No images are attached to the encounter.  Labs: No results found for: HGBA1C, ESRSEDRATE, CRP, LABURIC, REPTSTATUS, GRAMSTAIN, CULT, LABORGA   Lab Results  Component Value Date   ALBUMIN CANCELED 04/24/2018   ALBUMIN 3.3 (L) 04/16/2018   ALBUMIN 3.0 (L) 08/19/2017    Body mass index is 32.32 kg/m.  Orders:  Orders Placed This Encounter  Procedures  . XR Ankle Complete Right   Meds ordered this encounter  Medications  . oxyCODONE-acetaminophen (PERCOCET/ROXICET) 5-325 MG tablet    Sig: Take 1 tablet by mouth every 6 (six)  hours as needed for severe pain.    Dispense:  30 tablet    Refill:  0     Procedures: No procedures performed  Clinical Data: No additional findings.  ROS:  All other systems negative, except as noted in the HPI. Review of Systems  Constitutional: Negative for chills and fever.  Musculoskeletal: Positive for arthralgias and joint swelling.    Objective: Vital Signs: Ht 6\' 1"  (1.854 m)   Wt 245 lb (111.1 kg)   BMI 32.32 kg/m   Specialty Comments:  No specialty comments available.  PMFS History: Patient Active Problem List   Diagnosis Date Noted  . Status post ankle fusion 04/16/2018  . Nonunion of subtalar arthrodesis   . Nonunion of osteotomy site   . Back pain   . Rhabdomyolysis 08/16/2017  . Syncope 05/29/2017  . Bipolar disorder (HCC) 05/29/2017  . Schizophrenia (HCC) 05/29/2017  . TBI (traumatic brain injury) (HCC) 05/29/2017  . Unwitnessed fall 05/29/2017  . Alcohol abuse with intoxication (HCC) 05/29/2017  . Slurred speech 05/29/2017  . Chest pain 08/07/2016  . Cocaine use 08/07/2016  . Chronic musculoskeletal pain 08/07/2016  . CKD (chronic kidney disease) 08/07/2016  . Depression with anxiety 08/07/2016  . Traumatic arthritis of right ankle 07/31/2016  . 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 Osteoarthritis of right subtalar joint   . Right ankle pain 01/24/2016  . Bilateral hip pain 01/03/2016  .  Atrial fibrillation, chronic 11/18/2015  . Essential hypertension 11/18/2015  . Hyperlipidemia 11/18/2015  . DVT, bilateral lower limbs (HCC) 11/18/2015  . Pulmonary embolus (HCC) 11/18/2015   Past Medical History:  Diagnosis Date  . Anemia    takes Iron pill daily  . Anxiety    takes Xanax daily as needed  . Arthritis   . Atrial fibrillation (HCC)    takes Eliquis daily. Last dose will be 10/03/16  . Bipolar disorder (HCC)   . Depression    takes Risperdal daily  . DVT (deep venous thrombosis) (HCC) 2016   BLE  . Dyspnea    when  laying on back, with extertion  . Dysrhythmia    Afib  . Gout    takes Allopurinol daily  . Head injury   . Headache    occasionally  . History of hiatal hernia   . History of shingles 2010  . Hyperlipidemia    takes Atorvastatin daily  . Hyperlipidemia    takes Atorvastatin daily  . Hypertension    takes Metoprolol daily  . Insomnia    takes Ambien and Trazodone nightly  . Joint pain   . Joint swelling    right ankle  . Memory impairment   . 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 Nocturia   . Pulmonary embolism (HCC) 2016  . Schizophrenia (HCC)   . Seizures (HCC)    last one in 2002    Family History  Problem Relation Age of Onset  . Heart Problems Mother   . Heart attack Father        age 5440s    Past Surgical History:  Procedure Laterality Date  . ANKLE ARTHROSCOPY Right 04/20/2016   Procedure: ANKLE ARTHROSCOPY;  Surgeon: Nadara MustardMarcus V Duda, MD;  [facility];  Service: Orthopedics;  Laterality: Right;  . ANKLE ARTHROSCOPY WITH FUSION Right 10/05/2016   Procedure: RIGHT ANKLE ARTHROSCOPIC FUSION;  Surgeon: Nadara Mustarduda, Marcus V, MD;  [facility];  Service: Orthopedics;  Laterality: Right;  . ANKLE FUSION Right 04/20/2016   Procedure: Right Posterior Arthroscopic Subtalar Arthrodesis;  Surgeon: Nadara MustardMarcus V Duda, MD;  [facility];  Service: Orthopedics;  Laterality: Right;  . ANKLE FUSION Right 04/16/2018   Procedure: REVISION ANKLE FUSION, OPEN, ANTERIOR APPROACH;  Surgeon: Nadara Mustarduda, Marcus V, MD;  [facility];  Service: Orthopedics;  Laterality: Right;  . ANKLE SURGERY Right 04/16/2018   REVISION ANKLE FUSION, OPEN, ANTERIOR APPROACH  . CARDIAC CAT[facility]TERIZATION Right 2016   [facility] his [facility]  . COLONOSCOPY WITH PROPOFOL N/A 11/27/2016   Procedure: COLONOSCOPY WITH PROPOFOL;  Surgeon: Charolett BumpersJohnson, Martin K, MD;  [facility];  Service: Endoscopy;  Laterality: N/A;  . HERNIA REPAIR     1986-umbilical  . RIGHT HEART CATH   2016   [facility]   Social History   Occupational History  . Occupation: disabled  Tobacco Use  . Smoking status: Never Smoker  . Smokeless tobacco: Never Used  Substance and Sexual Activity  . Alcohol use: Yes    Comment: varies,  bringe drinker - per wife   . Drug use: Not Currently  . Sexual activity: Not on file

## 2018-05-26 ENCOUNTER — Ambulatory Visit (INDEPENDENT_AMBULATORY_CARE_PROVIDER_SITE_OTHER): Payer: Self-pay | Admitting: Physician Assistant

## 2018-05-28 ENCOUNTER — Other Ambulatory Visit: Payer: Self-pay | Admitting: Cardiovascular Disease

## 2018-06-11 ENCOUNTER — Encounter (INDEPENDENT_AMBULATORY_CARE_PROVIDER_SITE_OTHER): Payer: Self-pay | Admitting: Family

## 2018-06-11 ENCOUNTER — Ambulatory Visit (INDEPENDENT_AMBULATORY_CARE_PROVIDER_SITE_OTHER): Payer: Medicaid Other | Admitting: Family

## 2018-06-11 ENCOUNTER — Ambulatory Visit (INDEPENDENT_AMBULATORY_CARE_PROVIDER_SITE_OTHER): Payer: Self-pay

## 2018-06-11 VITALS — Ht 73.0 in | Wt 245.0 lb

## 2018-06-11 DIAGNOSIS — Z981 Arthrodesis status: Secondary | ICD-10-CM

## 2018-06-11 MED ORDER — OXYCODONE-ACETAMINOPHEN 5-325 MG PO TABS: 1.0000 | ORAL_TABLET | Freq: Two times a day (BID) | ORAL | 0 refills | Status: DC | PRN

## 2018-06-11 NOTE — Progress Notes (Signed)
Office Visit Note   Patient: Garrett Ferrell           Date of Birth: 05/12/1961           MRN: [id number] Visit Date: 06/11/2018              Requested by: Jackie Plumsei-Bonsu, George, MD [address] [place], [place] [id number] PCP: Jackie Plumsei-Bonsu, George, MD  Chief Complaint  Patient presents with  . Right Ankle - Routine Post Op      HPI: Patient is a 58 year old gentleman who presents in follow-up status post revision right ankle fusion on 04/16/18. Is now 8 weeks out.  Has been in a short leg cast with crutches. Does endorse weight bearing.  Assessment & Plan: Visit Diagnoses:  1. Status post ankle fusion     Plan: placed back in CAM walker. May begin 50% weight bearing in CAM. Follow up in 2 weeks with Dr. Lajoyce Cornersuda.  Follow-Up Instructions: Return in about 2 weeks (around 06/25/2018).   Ortho Exam  Patient is alert, oriented, no adenopathy, well-dressed, normal affect, normal respiratory effort. Examination patient has significantly improved swelling of the right lower extremity. Incision is well healed.  There is no drainage no cellulitis no redness no signs of infection.  Patient's foot is plantigrade.    Imaging: No results found. No images are attached to the encounter.  Labs: No results found for: HGBA1C, ESRSEDRATE, CRP, LABURIC, REPTSTATUS, GRAMSTAIN, CULT, LABORGA   Lab Results  Component Value Date   ALBUMIN CANCELED 04/24/2018   ALBUMIN 3.3 (L) 04/16/2018   ALBUMIN 3.0 (L) 08/19/2017    Body mass index is 32.32 kg/m.  Orders:  Orders Placed This Encounter  Procedures  . XR Ankle Complete Right   Meds ordered this encounter  Medications  . oxyCODONE-acetaminophen (PERCOCET/ROXICET) 5-325 MG tablet    Sig: Take 1 tablet by mouth 2 (two) times daily as needed for severe pain.    Dispense:  14 tablet    Refill:  0     Procedures: No procedures performed  Clinical Data: No additional findings.  ROS:  All other systems negative, except as noted  in the HPI. Review of Systems  Constitutional: Negative for chills and fever.  Musculoskeletal: Positive for arthralgias and joint swelling.    Objective: Vital Signs: Ht 6\' 1"  (1.854 m)   Wt 245 lb (111.1 kg)   BMI 32.32 kg/m   Specialty Comments:  No specialty comments available.  PMFS History: Patient Active Problem List   Diagnosis Date Noted  . Status post ankle fusion 04/16/2018  . Nonunion of subtalar arthrodesis   . Nonunion of osteotomy site   . Back pain   . Rhabdomyolysis 08/16/2017  . Syncope 05/29/2017  . Bipolar disorder (HCC) 05/29/2017  . Schizophrenia (HCC) 05/29/2017  . TBI (traumatic brain injury) (HCC) 05/29/2017  . Unwitnessed fall 05/29/2017  . Alcohol abuse with intoxication (HCC) 05/29/2017  . Slurred speech 05/29/2017  . Chest pain 08/07/2016  . Cocaine use 08/07/2016  . Chronic musculoskeletal pain 08/07/2016  . CKD (chronic kidney disease) 08/07/2016  . Depression with anxiety 08/07/2016  . Traumatic arthritis of right ankle 07/31/2016  . 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 Osteoarthritis of right subtalar joint   . Right ankle pain 01/24/2016  . Bilateral hip pain 01/03/2016  . Atrial fibrillation, chronic 11/18/2015  . Essential hypertension 11/18/2015  . Hyperlipidemia 11/18/2015  . DVT, bilateral lower limbs (HCC) 11/18/2015  . Pulmonary embolus (HCC) 11/18/2015  Past Medical History:  Diagnosis Date  . Anemia    takes Iron pill daily  . Anxiety    takes Xanax daily as needed  . Arthritis   . Atrial fibrillation (HCC)    takes Eliquis daily. Last dose will be 10/03/16  . Bipolar disorder (HCC)   . Depression    takes Risperdal daily  . DVT (deep venous thrombosis) (HCC) 2016   BLE  . Dyspnea    when laying on back, with extertion  . Dysrhythmia    Afib  . Gout    takes Allopurinol daily  . Head injury   . Headache    occasionally  . History of hiatal hernia   . History of shingles 2010  . Hyperlipidemia      takes Atorvastatin daily  . Hyperlipidemia    takes Atorvastatin daily  . Hypertension    takes Metoprolol daily  . Insomnia    takes Ambien and Trazodone nightly  . Joint pain   . Joint swelling    right ankle  . Memory impairment   . 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 Nocturia   . Pulmonary embolism (HCC) 2016  . Schizophrenia (HCC)   . Seizures (HCC)    last one in 2002    Family History  Problem Relation Age of Onset  . Heart Problems Mother   . Heart attack Father        age 5240s    Past Surgical History:  Procedure Laterality Date  . ANKLE ARTHROSCOPY Right 04/20/2016   Procedure: ANKLE ARTHROSCOPY;  Surgeon: Nadara MustardMarcus V Duda, MD;  [facility];  Service: Orthopedics;  Laterality: Right;  . ANKLE ARTHROSCOPY WITH FUSION Right 10/05/2016   Procedure: RIGHT ANKLE ARTHROSCOPIC FUSION;  Surgeon: Nadara Mustarduda, Marcus V, MD;  [facility];  Service: Orthopedics;  Laterality: Right;  . ANKLE FUSION Right 04/20/2016   Procedure: Right Posterior Arthroscopic Subtalar Arthrodesis;  Surgeon: Nadara MustardMarcus V Duda, MD;  [facility];  Service: Orthopedics;  Laterality: Right;  . ANKLE FUSION Right 04/16/2018   Procedure: REVISION ANKLE FUSION, OPEN, ANTERIOR APPROACH;  Surgeon: Nadara Mustarduda, Marcus V, MD;  [facility]: [facility];  Service: Orthopedics;  Laterality: Right;  . ANKLE SURGERY Right 04/16/2018   REVISION ANKLE FUSION, OPEN, ANTERIOR APPROACH  . CARDIAC CATHETERIZATION Right 2016   [facility]  . COLONOSCOPY WITH PROPOFOL N/A 11/27/2016   Procedure: COLONOSCOPY WITH PROPOFOL;  Surgeon: Charolett BumpersJohnson, Martin K, MD;  Location: [facility];  Service: Endoscopy;  Laterality: N/A;  . HERNIA REPAIR     1986-umbilical  . RIGHT HEART CATH  2016   [facility]   Social History   Occupational History  . Occupation: disabled  Tobacco Use  . Smoking status: Never Smoker  . Smokeless tobacco: Never Used   Substance and Sexual Activity  . Alcohol use: Yes    Comment: varies,  bringe drinker - per wife   . Drug use: Not Currently  . Sexual activity: Not on file

## 2018-06-26 ENCOUNTER — Encounter (INDEPENDENT_AMBULATORY_CARE_PROVIDER_SITE_OTHER): Payer: Self-pay | Admitting: Orthopedic Surgery

## 2018-06-26 ENCOUNTER — Ambulatory Visit (INDEPENDENT_AMBULATORY_CARE_PROVIDER_SITE_OTHER): Payer: Medicaid Other | Admitting: Orthopedic Surgery

## 2018-06-26 VITALS — Ht 73.0 in | Wt 245.0 lb

## 2018-06-26 DIAGNOSIS — Z981 Arthrodesis status: Secondary | ICD-10-CM

## 2018-06-26 MED ORDER — OXYCODONE-ACETAMINOPHEN 5-325 MG PO TABS: 1.0000 | ORAL_TABLET | Freq: Two times a day (BID) | ORAL | 0 refills | Status: DC | PRN

## 2018-07-07 ENCOUNTER — Encounter (INDEPENDENT_AMBULATORY_CARE_PROVIDER_SITE_OTHER): Payer: Self-pay | Admitting: Orthopedic Surgery

## 2018-07-07 NOTE — Progress Notes (Signed)
Office Visit Note   Patient: Garrett Ferrell           Date of Birth: 1961-02-11           MRN: [id number] Visit Date: 06/26/2018              Requested by: Jackie Plum, MD [address] [place], [place] [address] PCP: Jackie Plum, MD  Chief Complaint  Patient presents with  . Right Ankle - Routine Post Op    04/16/18 revision right ankle fusion       HPI: Patient is a 58 year old gentleman who presents status post revision fusion right ankle.  He has about 10 weeks out.  Patient was supposed to be partial weightbearing but he has been to crutches due to excessive pressure.  Patient states he has pain laterally over the foot but he states he is pain is better than it was before surgery.  Assessment & Plan: Visit Diagnoses:  1. Status post ankle fusion     Plan: Discussed treatment recommendation of applying a cast.  Patient states he does not want to consider a cast.  We will have him continue with the boot continue with crutches  Repeat three-view radiographs of the right ankle with follow-up prescription for Percocet provided.  Follow-Up Instructions: Return in about 4 weeks (around 07/24/2018).   Ortho Exam  Patient is alert, oriented, no adenopathy, well-dressed, normal affect, normal respiratory effort. Examination patient's foot is plantigrade there is no redness no swelling no open wounds no signs of infection.  Imaging: No results found. No images are attached to the encounter.  Labs: No results found for: HGBA1C, ESRSEDRATE, CRP, LABURIC, REPTSTATUS, GRAMSTAIN, CULT, LABORGA   Lab Results  Component Value Date   ALBUMIN CANCELED 04/24/2018   ALBUMIN 3.3 (L) 04/16/2018   ALBUMIN 3.0 (L) 08/19/2017    Body mass index is 32.32 kg/m.  Orders:  No orders of the defined types were placed in this encounter.  Meds ordered this encounter  Medications  . oxyCODONE-acetaminophen (PERCOCET/ROXICET) 5-325 MG tablet    Sig: Take 1 tablet  by mouth 2 (two) times daily as needed for severe pain.    Dispense:  14 tablet    Refill:  0     Procedures: No procedures performed  Clinical Data: No additional findings.  ROS:  All other systems negative, except as noted in the HPI. Review of Systems  Objective: Vital Signs: Ht 6\' 1"  (1.854 m)   Wt 245 lb (111.1 kg)   BMI 32.32 kg/m   Specialty Comments:  No specialty comments available.  PMFS History: Patient Active Problem List   Diagnosis Date Noted  . Status post ankle fusion 04/16/2018  . Nonunion of subtalar arthrodesis   . Nonunion of osteotomy site   . Back pain   . Rhabdomyolysis 08/16/2017  . Syncope 05/29/2017  . Bipolar disorder (HCC) 05/29/2017  . Schizophrenia (HCC) 05/29/2017  . TBI (traumatic brain injury) (HCC) 05/29/2017  . Unwitnessed fall 05/29/2017  . Alcohol abuse with intoxication (HCC) 05/29/2017  . Slurred speech 05/29/2017  . Chest pain 08/07/2016  . Cocaine use 08/07/2016  . Chronic musculoskeletal pain 08/07/2016  . CKD (chronic kidney disease) 08/07/2016  . Depression with anxiety 08/07/2016  . Traumatic arthritis of right ankle 07/31/2016  . 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 Osteoarthritis of right subtalar joint   . Right ankle pain 01/24/2016  . Bilateral hip pain 01/03/2016  . Atrial fibrillation, chronic 11/18/2015  .  Essential hypertension 11/18/2015  . Hyperlipidemia 11/18/2015  . DVT, bilateral lower limbs (HCC) 11/18/2015  . Pulmonary embolus (HCC) 11/18/2015   Past Medical History:  Diagnosis Date  . Anemia    takes Iron pill daily  . Anxiety    takes Xanax daily as needed  . Arthritis   . Atrial fibrillation (HCC)    takes Eliquis daily. Last dose will be 10/03/16  . Bipolar disorder (HCC)   . Depression    takes Risperdal daily  . DVT (deep venous thrombosis) (HCC) 2016   BLE  . Dyspnea    when laying on back, with extertion  . Dysrhythmia    Afib  . Gout    takes Allopurinol daily  .  Head injury   . Headache    occasionally  . History of hiatal hernia   . History of shingles 2010  . Hyperlipidemia    takes Atorvastatin daily  . Hyperlipidemia    takes Atorvastatin daily  . Hypertension    takes Metoprolol daily  . Insomnia    takes Ambien and Trazodone nightly  . Joint pain   . Joint swelling    right ankle  . Memory impairment   . 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 Nocturia   . Pulmonary embolism (HCC) 2016  . Schizophrenia (HCC)   . Seizures (HCC)    last one in 2002    Family History  Problem Relation Age of Onset  . Heart Problems Mother   . Heart attack Father        age 4s    Past Surgical History:  Procedure Laterality Date  . ANKLE ARTHROSCOPY Right 04/20/2016   Procedure: ANKLE ARTHROSCOPY;  Surgeon: Nadara Mustard, MD;  Location: [facility] OR;  Service: Orthopedics;  Laterality: Right;  . ANKLE ARTHROSCOPY WITH FUSION Right 10/05/2016   Procedure: RIGHT ANKLE ARTHROSCOPIC FUSION;  Surgeon: Nadara Mustard, MD;  Location: [facility] OR;  Service: Orthopedics;  Laterality: Right;  . ANKLE FUSION Right 04/20/2016   Procedure: Right Posterior Arthroscopic Subtalar Arthrodesis;  Surgeon: Nadara Mustard, MD;  Location: [facility] OR;  Service: Orthopedics;  Laterality: Right;  . ANKLE FUSION Right 04/16/2018   Procedure: REVISION ANKLE FUSION, OPEN, ANTERIOR APPROACH;  Surgeon: Nadara Mustard, MD;  Location: [facility];  Service: Orthopedics;  Laterality: Right;  . ANKLE SURGERY Right 04/16/2018   REVISION ANKLE FUSION, OPEN, ANTERIOR APPROACH  . CARDIAC CATHETERIZATION Right 2016   at [facility] during his DVT/PE  . COLONOSCOPY WITH PROPOFOL N/A 11/27/2016   Procedure: COLONOSCOPY WITH PROPOFOL;  Surgeon: Charolett Bumpers, MD;  Location: [facility];  Service: Endoscopy;  Laterality: N/A;  . HERNIA REPAIR     1986-umbilical  . RIGHT HEART CATH  2016   at [facility] during his DVT/PE   Social History   Occupational  History  . Occupation: disabled  Tobacco Use  . Smoking status: Never Smoker  . Smokeless tobacco: Never Used  Substance and Sexual Activity  . Alcohol use: Yes    Comment: varies,  bringe drinker - per wife   . Drug use: Not Currently  . Sexual activity: Not on file

## 2018-07-24 ENCOUNTER — Ambulatory Visit (INDEPENDENT_AMBULATORY_CARE_PROVIDER_SITE_OTHER): Payer: Medicaid Other

## 2018-07-24 ENCOUNTER — Encounter (INDEPENDENT_AMBULATORY_CARE_PROVIDER_SITE_OTHER): Payer: Self-pay | Admitting: Orthopedic Surgery

## 2018-07-24 ENCOUNTER — Ambulatory Visit (INDEPENDENT_AMBULATORY_CARE_PROVIDER_SITE_OTHER): Payer: Medicaid Other | Admitting: Physician Assistant

## 2018-07-24 VITALS — Ht 73.0 in | Wt 245.0 lb

## 2018-07-24 DIAGNOSIS — Z981 Arthrodesis status: Secondary | ICD-10-CM

## 2018-07-24 MED ORDER — OXYCODONE-ACETAMINOPHEN 5-325 MG PO TABS: 1.0000 | ORAL_TABLET | Freq: Two times a day (BID) | ORAL | 0 refills | Status: DC | PRN

## 2018-07-24 NOTE — Progress Notes (Signed)
Office Visit Note   Patient: Garrett Ferrell           Date of Birth: Sep 19, 1960           MRN: [id number] Visit Date: 07/24/2018              Requested by: Jackie Plum, MD [address] [place], [place] [address] PCP: Jackie Plum, MD  Chief Complaint  Patient presents with  . Right Ankle - Routine Post Op    04/16/18 revision ankle fusion      HPI: The patient is a 58 year old gentleman who is seen for postoperative follow-up following a revision right ankle fusion on 04/16/2018.  He has been walking in a fracture boot with crutches.  He reports that the pain is getting a little better but still a 7 out of a 10 level at times.  He has been wearing a compression stocking and doing scar massage as instructed.  He does complain of pain over the incisional site and over the anterior ankle.  Assessment & Plan: Visit Diagnoses:  1. Status post ankle fusion     Plan: Patient instructed to continue with his compression stocking and continue with scar massage.  Continue with the fracture boot and crutches as he is continuing to have pain.  Percocet was refilled this visit.  He will follow-up here in 4 weeks with follow-up radiographs or sooner should he have difficulty in the interim.  Follow-Up Instructions: Return in about 4 weeks (around 08/21/2018).   Ortho Exam  Patient is alert, oriented, no adenopathy, well-dressed, normal affect, normal respiratory effort. Examination of the right ankle shows a well-healed incision.  There is no erythema or other signs of cellulitis.  He has good pedal pulses.  He has edema localized to the ankle area.  Imaging: No results found. No images are attached to the encounter.  Labs: No results found for: HGBA1C, ESRSEDRATE, CRP, LABURIC, REPTSTATUS, GRAMSTAIN, CULT, LABORGA   Lab Results  Component Value Date   ALBUMIN CANCELED 04/24/2018   ALBUMIN 3.3 (L) 04/16/2018   ALBUMIN 3.0 (L) 08/19/2017    Body mass index is 32.32  kg/m.  Orders:  Orders Placed This Encounter  Procedures  . XR Ankle Complete Right   Meds ordered this encounter  Medications  . oxyCODONE-acetaminophen (PERCOCET/ROXICET) 5-325 MG tablet    Sig: Take 1 tablet by mouth 2 (two) times daily as needed for moderate pain or severe pain.    Dispense:  14 tablet    Refill:  0     Procedures: No procedures performed  Clinical Data: No additional findings.  ROS:  All other systems negative, except as noted in the HPI. Review of Systems  Objective: Vital Signs: Ht 6\' 1"  (1.854 m)   Wt 245 lb (111.1 kg)   BMI 32.32 kg/m   Specialty Comments:  No specialty comments available.  PMFS History: Patient Active Problem List   Diagnosis Date Noted  . Status post ankle fusion 04/16/2018  . Nonunion of subtalar arthrodesis   . Nonunion of osteotomy site   . Back pain   . Rhabdomyolysis 08/16/2017  . Syncope 05/29/2017  . Bipolar disorder (HCC) 05/29/2017  . Schizophrenia (HCC) 05/29/2017  . TBI (traumatic brain injury) (HCC) 05/29/2017  . Unwitnessed fall 05/29/2017  . Alcohol abuse with intoxication (HCC) 05/29/2017  . Slurred speech 05/29/2017  . Chest pain 08/07/2016  . Cocaine use 08/07/2016  . Chronic musculoskeletal pain 08/07/2016  . CKD (chronic kidney  disease) 08/07/2016  . Depression with anxiety 08/07/2016  . Traumatic arthritis of right ankle 07/31/2016  . 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 Osteoarthritis of right subtalar joint   . Right ankle pain 01/24/2016  . Bilateral hip pain 01/03/2016  . Atrial fibrillation, chronic 11/18/2015  . Essential hypertension 11/18/2015  . Hyperlipidemia 11/18/2015  . DVT, bilateral lower limbs (HCC) 11/18/2015  . Pulmonary embolus (HCC) 11/18/2015   Past Medical History:  Diagnosis Date  . Anemia    takes Iron pill daily  . Anxiety    takes Xanax daily as needed  . Arthritis   . Atrial fibrillation (HCC)    takes Eliquis daily. Last dose will be 10/03/16   . Bipolar disorder (HCC)   . Depression    takes Risperdal daily  . DVT (deep venous thrombosis) (HCC) 2016   BLE  . Dyspnea    when laying on back, with extertion  . Dysrhythmia    Afib  . Gout    takes Allopurinol daily  . Head injury   . Headache    occasionally  . History of hiatal hernia   . History of shingles 2010  . Hyperlipidemia    takes Atorvastatin daily  . Hyperlipidemia    takes Atorvastatin daily  . Hypertension    takes Metoprolol daily  . Insomnia    takes Ambien and Trazodone nightly  . Joint pain   . Joint swelling    right ankle  . Memory impairment   . 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 Nocturia   . Pulmonary embolism (HCC) 2016  . Schizophrenia (HCC)   . Seizures (HCC)    last one in 2002    Family History  Problem Relation Age of Onset  . Heart Problems Mother   . Heart attack Father        age 59s    Past Surgical History:  Procedure Laterality Date  . ANKLE ARTHROSCOPY Right 04/20/2016   Procedure: ANKLE ARTHROSCOPY;  Surgeon: Nadara Mustard, MD;  [facility];  Service: Orthopedics;  Laterality: Right;  . ANKLE ARTHROSCOPY WITH FUSION Right 10/05/2016   Procedure: RIGHT ANKLE ARTHROSCOPIC FUSION;  Surgeon: Nadara Mustard, MD;  Location: [facility];  Service: Orthopedics;  Laterality: Right;  . ANKLE FUSION Right 04/20/2016   Procedure: Right Posterior Arthroscopic Subtalar Arthrodesis;  Surgeon: Nadara Mustard, MD;  Location: [facility] OR;  Service: Orthopedics;  Laterality: Right;  . ANKLE FUSION Right 04/16/2018   Procedure: REVISION ANKLE FUSION, OPEN, ANTERIOR APPROACH;  Surgeon: Nadara Mustard, MD;  Location: [facility] OR;  Service: Orthopedics;  Laterality: Right;  . ANKLE SURGERY Right 04/16/2018   REVISION ANKLE FUSION, OPEN, ANTERIOR APPROACH  . CARDIAC CATHETERIZATION Right 2016   at [facility] during his DVT/PE  . COLONOSCOPY WITH PROPOFOL N/A 11/27/2016   Procedure: COLONOSCOPY WITH PROPOFOL;  Surgeon: Charolett Bumpers, MD;  Location: [facility];  Service: Endoscopy;  Laterality: N/A;  . HERNIA REPAIR     1986-umbilical  . RIGHT HEART CATH  2016   at [facility] during his DVT/PE   Social History   Occupational History  . Occupation: disabled  Tobacco Use  . Smoking status: Never Smoker  . Smokeless tobacco: Never Used  Substance and Sexual Activity  . Alcohol use: Yes    Comment: varies,  bringe drinker - per wife   . Drug use: Not Currently  . Sexual activity:  Not on file

## 2018-07-27 ENCOUNTER — Encounter (INDEPENDENT_AMBULATORY_CARE_PROVIDER_SITE_OTHER): Payer: Self-pay | Admitting: Physician Assistant

## 2018-08-21 ENCOUNTER — Ambulatory Visit (INDEPENDENT_AMBULATORY_CARE_PROVIDER_SITE_OTHER): Payer: Medicaid Other | Admitting: Orthopedic Surgery

## 2018-09-01 ENCOUNTER — Telehealth (INDEPENDENT_AMBULATORY_CARE_PROVIDER_SITE_OTHER): Payer: Self-pay | Admitting: *Deleted

## 2018-09-01 NOTE — Telephone Encounter (Signed)
Called pt and they answered no to all covid-19 pre screening questions.  

## 2018-09-02 ENCOUNTER — Ambulatory Visit (INDEPENDENT_AMBULATORY_CARE_PROVIDER_SITE_OTHER): Payer: Medicaid Other | Admitting: Orthopedic Surgery

## 2018-09-26 ENCOUNTER — Other Ambulatory Visit (INDEPENDENT_AMBULATORY_CARE_PROVIDER_SITE_OTHER): Payer: Self-pay | Admitting: Physician Assistant

## 2018-09-26 DIAGNOSIS — Z981 Arthrodesis status: Secondary | ICD-10-CM

## 2018-09-26 MED ORDER — OXYCODONE-ACETAMINOPHEN 5-325 MG PO TABS: 1.0000 | ORAL_TABLET | Freq: Two times a day (BID) | ORAL | 0 refills | Status: DC | PRN

## 2018-09-26 NOTE — Progress Notes (Signed)
Percocet prescription refilled as directed per Dr. Lajoyce Corners.

## 2018-11-06 ENCOUNTER — Ambulatory Visit (INDEPENDENT_AMBULATORY_CARE_PROVIDER_SITE_OTHER): Payer: Medicaid Other | Admitting: Physician Assistant

## 2018-11-06 ENCOUNTER — Ambulatory Visit (INDEPENDENT_AMBULATORY_CARE_PROVIDER_SITE_OTHER): Payer: Medicaid Other

## 2018-11-06 ENCOUNTER — Other Ambulatory Visit: Payer: Self-pay

## 2018-11-06 ENCOUNTER — Encounter: Payer: Self-pay | Admitting: Orthopedic Surgery

## 2018-11-06 VITALS — Ht 73.0 in | Wt 245.0 lb

## 2018-11-06 DIAGNOSIS — Z981 Arthrodesis status: Secondary | ICD-10-CM | POA: Diagnosis not present

## 2018-11-06 MED ORDER — OXYCODONE-ACETAMINOPHEN 5-325 MG PO TABS: 1.0000 | ORAL_TABLET | Freq: Two times a day (BID) | ORAL | 0 refills | Status: DC | PRN

## 2018-11-09 ENCOUNTER — Encounter: Payer: Self-pay | Admitting: Physician Assistant

## 2018-11-09 NOTE — Progress Notes (Signed)
Office Visit Note   Patient: Garrett Ferrell           Date of Birth: 06-11-1960           MRN: [id number] Visit Date: 11/06/2018              Requested by: Benito Mccreedy, MD [place], [place] [address] PCP: Benito Mccreedy, MD  Chief Complaint  Patient presents with  . Right Ankle - Follow-up    04/16/18 revision right ankle fusion       HPI: The patient is a 58 year old gentleman who is seen for postoperative follow-up following revision of a right ankle fusion on 04/16/2018.  He has been walking with a fracture boot but reports the pain continues to get worse.  He has pain across the anterior ankle and dorsum of the foot.  He reports intermittent swelling.  He has been wearing a medical compression sock but reports that this is not helping.  Assessment & Plan: Visit Diagnoses:  1. Status post ankle fusion     Plan: Counseled the patient that he has evidence of fibrous union.  Working to place him in a weightbearing short leg cast for the next several weeks.  We also discussed vitamin D3 supplementation 2000 international units daily, calcium 1500 mg daily and whey protein supplementation 30 g daily.  The patient will follow-up in 3 weeks or sooner should he have difficulty in the interim.  Follow-Up Instructions: Return in about 3 weeks (around 11/27/2018).   Ortho Exam  Patient is alert, oriented, no adenopathy, well-dressed, normal affect, normal respiratory effort. The patient's incisions are well-healed.  There is no signs of cellulitis or infection.  He does have localized edema to the ankle but no increased warmth or erythema.  He has good palpable pedal pulses.  Imaging: No results found. No images are attached to the encounter.  Labs: No results found for: HGBA1C, ESRSEDRATE, CRP, LABURIC, REPTSTATUS, GRAMSTAIN, CULT, LABORGA   Lab Results  Component Value Date   ALBUMIN CANCELED 04/24/2018   ALBUMIN 3.3 (L) 04/16/2018   ALBUMIN 3.0  (L) 08/19/2017    Body mass index is 32.32 kg/m.  Orders:  Orders Placed This Encounter  Procedures  . XR Ankle Complete Right   Meds ordered this encounter  Medications  . oxyCODONE-acetaminophen (PERCOCET/ROXICET) 5-325 MG tablet    Sig: Take 1 tablet by mouth 2 (two) times daily as needed for moderate pain or severe pain.    Dispense:  14 tablet    Refill:  0     Procedures: No procedures performed  Clinical Data: No additional findings.  ROS:  All other systems negative, except as noted in the HPI. Review of Systems  Objective: Vital Signs: Ht 6\' 1"  (1.854 m)   Wt 245 lb (111.1 kg)   BMI 32.32 kg/m   Specialty Comments:  No specialty comments available.  PMFS History: Patient Active Problem List   Diagnosis Date Noted  . Status post ankle fusion 04/16/2018  . Nonunion of subtalar arthrodesis   . Nonunion of osteotomy site   . Back pain   . Rhabdomyolysis 08/16/2017  . Syncope 05/29/2017  . Bipolar disorder ([place]) 05/29/2017  . Schizophrenia ([place]) 05/29/2017  . TBI (traumatic brain injury) ([place]) 05/29/2017  . Unwitnessed fall 05/29/2017  . Alcohol abuse with intoxication ([place]) 05/29/2017  . Slurred speech 05/29/2017  . Chest pain 08/07/2016  . Cocaine use 08/07/2016  . Chronic musculoskeletal pain 08/07/2016  .  CKD (chronic kidney disease) 08/07/2016  . Depression with anxiety 08/07/2016  . Traumatic arthritis of right ankle 07/31/2016  . 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 Osteoarthritis of right subtalar joint   . Right ankle pain 01/24/2016  . Bilateral hip pain 01/03/2016  . Atrial fibrillation, chronic 11/18/2015  . Essential hypertension 11/18/2015  . Hyperlipidemia 11/18/2015  . DVT, bilateral lower limbs (HCC) 11/18/2015  . Pulmonary embolus (HCC) 11/18/2015   Past Medical History:  Diagnosis Date  . Anemia    takes Iron pill daily  . Anxiety    takes Xanax daily as needed  . Arthritis   . Atrial fibrillation (HCC)     takes Eliquis daily. Last dose will be 10/03/16  . Bipolar disorder (HCC)   . Depression    takes Risperdal daily  . DVT (deep venous thrombosis) (HCC) 2016   BLE  . Dyspnea    when laying on back, with extertion  . Dysrhythmia    Afib  . Gout    takes Allopurinol daily  . Head injury   . Headache    occasionally  . History of hiatal hernia   . History of shingles 2010  . Hyperlipidemia    takes Atorvastatin daily  . Hyperlipidemia    takes Atorvastatin daily  . Hypertension    takes Metoprolol daily  . Insomnia    takes Ambien and Trazodone nightly  . Joint pain   . Joint swelling    right ankle  . Memory impairment   . 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 Nocturia   . Pulmonary embolism (HCC) 2016  . Schizophrenia (HCC)   . Seizures (HCC)    last one in 2002    Family History  Problem Relation Age of Onset  . Heart Problems Mother   . Heart attack Father        age 4940s    Past Surgical History:  Procedure Laterality Date  . ANKLE ARTHROSCOPY Right 04/20/2016   Procedure: ANKLE ARTHROSCOPY;  Surgeon: Nadara MustardMarcus V Duda, MD;  [facility];  Service: Orthopedics;  Laterality: Right;  . ANKLE ARTHROSCOPY WITH FUSION Right 10/05/2016   Procedure: RIGHT ANKLE ARTHROSCOPIC FUSION;  Surgeon: Nadara Mustarduda, Marcus V, MD;  [facility];  Service: Orthopedics;  Laterality: Right;  . ANKLE FUSION Right 04/20/2016   Procedure: Right Posterior Arthroscopic Subtalar Arthrodesis;  Surgeon: Nadara MustardMarcus V Duda, MD;  [facility];  Service: Orthopedics;  Laterality: Right;  . ANKLE FUSION Right 04/16/2018   Procedure: REVISION ANKLE FUSION, OPEN, ANTERIOR APPROACH;  Surgeon: Nadara Mustarduda, Marcus V, MD;  [facility];  Service: Orthopedics;  Laterality: Right;  . ANKLE SURGERY Right 04/16/2018   REVISION ANKLE FUSION, OPEN, ANTERIOR APPROACH  . CARDIAC CATHETERIZATION Right 2016   [facility]  . COLONOSCOPY WITH PROPOFOL N/A 11/27/2016   Procedure:  COLONOSCOPY WITH PROPOFOL;  Surgeon: Charolett BumpersJohnson, Martin K, MD;  [facility];  Service: Endoscopy;  Laterality: N/A;  . HERNIA REPAIR     1986-umbilical  . RIGHT HEART CATH  2016   [facility] his [facility]   Social History   Occupational History  . Occupation: disabled  Tobacco Use  . Smoking status: Never Smoker  . Smokeless tobacco: Never Used  Substance and Sexual Activity  . Alcohol use: Yes    Comment: varies,  bringe drinker - per wife   . Drug use: Not Currently  .  Sexual activity: Not on file

## 2018-11-27 ENCOUNTER — Ambulatory Visit (INDEPENDENT_AMBULATORY_CARE_PROVIDER_SITE_OTHER): Payer: Medicaid Other | Admitting: Orthopedic Surgery

## 2018-11-27 ENCOUNTER — Ambulatory Visit (INDEPENDENT_AMBULATORY_CARE_PROVIDER_SITE_OTHER): Payer: Medicaid Other

## 2018-11-27 ENCOUNTER — Other Ambulatory Visit: Payer: Self-pay

## 2018-11-27 ENCOUNTER — Encounter: Payer: Self-pay | Admitting: Orthopedic Surgery

## 2018-11-27 VITALS — Ht 73.0 in | Wt 245.0 lb

## 2018-11-27 DIAGNOSIS — Z981 Arthrodesis status: Secondary | ICD-10-CM | POA: Diagnosis not present

## 2018-11-27 MED ORDER — OXYCODONE-ACETAMINOPHEN 5-325 MG PO TABS: 1.0000 | ORAL_TABLET | ORAL | 0 refills | Status: DC | PRN

## 2018-11-27 NOTE — Progress Notes (Signed)
Office Visit Note   Patient: Garrett Ferrell           Date of Birth: 09/07/1960           MRN: [id number] Visit Date: 11/27/2018              Requested by: Jackie Plumsei-Bonsu, George, MD [id number] [place],  [place] [id number] PCP: Jackie Plumsei-Bonsu, George, MD  Chief Complaint  Patient presents with  . Right Ankle - Follow-up      HPI: Patient is a 58 year old gentleman who presents in follow-up for tibiotalar and subtalar fusion.  Patient has been taking protein calcium and vitamin D3 supplements.  He has recently been in a cast he states he has pain with initial weightbearing directly over the anterior aspect of the ankle.  Assessment & Plan: Visit Diagnoses:  1. Status post ankle fusion     Plan: We will place him in a walking boot prescription called in for Percocet follow-up in 4 weeks at which time we will repeat AP lateral oblique radiographs of the right ankle.  Follow-Up Instructions: No follow-ups on file.   Ortho Exam  Patient is alert, oriented, no adenopathy, well-dressed, normal affect, normal respiratory effort. Examination patient's foot is plantigrade there is no varus valgus dorsiflexion plantarflexion abnormalities.  There is good wrinkling of the skin there is no hypersensitivity to light touch there is no swelling.  There is no warmth no redness no signs of infection.  Imaging: Xr Foot Complete Right  Result Date: 11/27/2018 3 view radiographs of the right foot shows excellent healing of the tibial calcaneal fusion no hardware failure.  No images are attached to the encounter.  Labs: No results found for: HGBA1C, ESRSEDRATE, CRP, LABURIC, REPTSTATUS, GRAMSTAIN, CULT, LABORGA   Lab Results  Component Value Date   ALBUMIN CANCELED 04/24/2018   ALBUMIN 3.3 (L) 04/16/2018   ALBUMIN 3.0 (L) 08/19/2017    Body mass index is 32.32 kg/m.  Orders:  Orders Placed This Encounter  Procedures  . XR Foot Complete Right   Meds ordered this encounter   Medications  . oxyCODONE-acetaminophen (PERCOCET/ROXICET) 5-325 MG tablet    Sig: Take 1 tablet by mouth every 4 (four) hours as needed for severe pain.    Dispense:  30 tablet    Refill:  0     Procedures: No procedures performed  Clinical Data: No additional findings.  ROS:  All other systems negative, except as noted in the HPI. Review of Systems  Objective: Vital Signs: Ht 6\' 1"  (1.854 m)   Wt 245 lb (111.1 kg)   BMI 32.32 kg/m   Specialty Comments:  No specialty comments available.  PMFS History: Patient Active Problem List   Diagnosis Date Noted  . Status post ankle fusion 04/16/2018  . Nonunion of subtalar arthrodesis   . Nonunion of osteotomy site   . Back pain   . Rhabdomyolysis 08/16/2017  . Syncope 05/29/2017  . Bipolar disorder (HCC) 05/29/2017  . Schizophrenia (HCC) 05/29/2017  . TBI (traumatic brain injury) (HCC) 05/29/2017  . Unwitnessed fall 05/29/2017  . Alcohol abuse with intoxication (HCC) 05/29/2017  . Slurred speech 05/29/2017  . Chest pain 08/07/2016  . Cocaine use 08/07/2016  . Chronic musculoskeletal pain 08/07/2016  . CKD (chronic kidney disease) 08/07/2016  . Depression with anxiety 08/07/2016  . Traumatic arthritis of right ankle 07/31/2016  . 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 Osteoarthritis of right subtalar joint   . Right ankle pain 01/24/2016  .  Bilateral hip pain 01/03/2016  . Atrial fibrillation, chronic 11/18/2015  . Essential hypertension 11/18/2015  . Hyperlipidemia 11/18/2015  . DVT, bilateral lower limbs ([place]) 11/18/2015  . Pulmonary embolus ([place]) 11/18/2015   Past Medical History:  Diagnosis Date  . Anemia    takes Iron pill daily  . Anxiety    takes Xanax daily as needed  . Arthritis   . Atrial fibrillation ([place])    takes Eliquis daily. Last dose will be 10/03/16  . Bipolar disorder ([place])   . Depression    takes Risperdal daily  . DVT (deep venous thrombosis) ([place]) 2016   BLE  . Dyspnea     when laying on back, with extertion  . Dysrhythmia    Afib  . Gout    takes Allopurinol daily  . Head injury   . Headache    occasionally  . History of hiatal hernia   . History of shingles 2010  . Hyperlipidemia    takes Atorvastatin daily  . Hyperlipidemia    takes Atorvastatin daily  . Hypertension    takes Metoprolol daily  . Insomnia    takes Ambien and Trazodone nightly  . Joint pain   . Joint swelling    right ankle  . Memory impairment   . 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 Nocturia   . Pulmonary embolism ([place]) 2016  . Schizophrenia ([place])   . Seizures ([place])    last one in 2002    Family History  Problem Relation Age of Onset  . Heart Problems Mother   . Heart attack Father        age 19s    Past Surgical History:  Procedure Laterality Date  . ANKLE ARTHROSCOPY Right 04/20/2016   Procedure: ANKLE ARTHROSCOPY;  Surgeon: [name], MD;  Location: [place];  Service: Orthopedics;  Laterality: Right;  . ANKLE ARTHROSCOPY WITH FUSION Right 10/05/2016   Procedure: RIGHT ANKLE ARTHROSCOPIC FUSION;  Surgeon: [name], MD;  Location: [facility];  Service: Orthopedics;  Laterality: Right;  . ANKLE FUSION Right 04/20/2016   Procedure: Right Posterior Arthroscopic Subtalar Arthrodesis;  Surgeon: [name], MD;  Location: [place];  Service: Orthopedics;  Laterality: Right;  . ANKLE FUSION Right 04/16/2018   Procedure: REVISION ANKLE FUSION, OPEN, ANTERIOR APPROACH;  Surgeon: [name], MD;  Location: [place];  Service: Orthopedics;  Laterality: Right;  . ANKLE SURGERY Right 04/16/2018   REVISION ANKLE FUSION, OPEN, ANTERIOR APPROACH  . CARDIAC CATHETERIZATION Right 2016   at [facility] during his DVT/PE  . COLONOSCOPY WITH PROPOFOL N/A 11/27/2016   Procedure: COLONOSCOPY WITH PROPOFOL;  Surgeon: Garlan Fair, MD;  [facility];  Service: Endoscopy;  Laterality: N/A;  . HERNIA REPAIR     1986-umbilical  . RIGHT HEART CATH   2016   at [facility] during his DVT/PE   Social History   Occupational History  . Occupation: disabled  Tobacco Use  . Smoking status: Never Smoker  . Smokeless tobacco: Never Used  Substance and Sexual Activity  . Alcohol use: Yes    Comment: varies,  bringe drinker - per wife   . Drug use: Not Currently  . Sexual activity: Not on file

## 2018-12-19 ENCOUNTER — Other Ambulatory Visit: Payer: Self-pay | Admitting: Cardiovascular Disease

## 2018-12-19 NOTE — Telephone Encounter (Signed)
58yom, 111.1kg, scr1.12(04/16/18), lovw/berry (03/25/18)

## 2018-12-25 ENCOUNTER — Other Ambulatory Visit: Payer: Self-pay

## 2018-12-25 ENCOUNTER — Ambulatory Visit (INDEPENDENT_AMBULATORY_CARE_PROVIDER_SITE_OTHER): Payer: Medicaid Other

## 2018-12-25 ENCOUNTER — Encounter: Payer: Self-pay | Admitting: Orthopedic Surgery

## 2018-12-25 ENCOUNTER — Ambulatory Visit (INDEPENDENT_AMBULATORY_CARE_PROVIDER_SITE_OTHER): Payer: Medicaid Other | Admitting: Orthopedic Surgery

## 2018-12-25 VITALS — Ht 73.0 in | Wt 245.0 lb

## 2018-12-25 DIAGNOSIS — Z981 Arthrodesis status: Secondary | ICD-10-CM

## 2018-12-25 MED ORDER — OXYCODONE-ACETAMINOPHEN 5-325 MG PO TABS: 1.0000 | ORAL_TABLET | Freq: Three times a day (TID) | ORAL | 0 refills | Status: DC | PRN

## 2018-12-29 ENCOUNTER — Encounter: Payer: Self-pay | Admitting: Orthopedic Surgery

## 2018-12-29 NOTE — Progress Notes (Signed)
Office Visit Note   Patient: Garrett Ferrell           Date of Birth: 12/06/1960           MRN: [id number] Visit Date: 12/25/2018              Requested by: Jackie Plumsei-Bonsu, George, MD [id number] [place],  [place] [phone] PCP: Jackie Plumsei-Bonsu, George, MD  Chief Complaint  Patient presents with  . Right Ankle - Follow-up      HPI: Patient is a 58 year old gentleman who is 8 months status post revision ankle fusion.  Patient states that his pain level is severe he is weightbearing with a fracture boot and an ankle brace.  He states that nothing seems to be helping he states the swelling has decreased.  Assessment & Plan: Visit Diagnoses:  1. Status post ankle fusion     Plan: Recommended a cast to try to decrease microscopic motion.  Patient states he does not want to try a cast advised the next best option would be to minimize weightbearing with essentially nonweightbearing.  Prescription called in for Percocet repeat radiographs at follow-up.  Follow-Up Instructions: Return in about 4 weeks (around 01/22/2019).   Ortho Exam  Patient is alert, oriented, no adenopathy, well-dressed, normal affect, normal respiratory effort. Examination patient's foot is plantigrade there is no swelling no cellulitis the incisions are well-healed no signs of infection.  Imaging: No results found. No images are attached to the encounter.  Labs: No results found for: HGBA1C, ESRSEDRATE, CRP, LABURIC, REPTSTATUS, GRAMSTAIN, CULT, LABORGA   Lab Results  Component Value Date   ALBUMIN CANCELED 04/24/2018   ALBUMIN 3.3 (L) 04/16/2018   ALBUMIN 3.0 (L) 08/19/2017    No results found for: MG No results found for: VD25OH  No results found for: PREALBUMIN CBC EXTENDED Latest Ref Rng & Units 04/16/2018 08/17/2017 08/15/2017  WBC 4.0 - 10.5 K/uL 3.8(L) 7.4 9.9  RBC 4.22 - 5.81 MIL/uL 4.99 4.95 5.45  HGB 13.0 - 17.0 g/dL 11.4(L) 11.4(L) 12.7(L)  HCT 39.0 - 52.0 % 36.6(L) 35.0(L) 38.0(L)   PLT 150 - 400 K/uL 169 149(L) 178  NEUTROABS 1.7 - 7.7 K/uL - - -  LYMPHSABS 0.7 - 4.0 K/uL - - -     Body mass index is 32.32 kg/m.  Orders:  Orders Placed This Encounter  Procedures  . XR Ankle Complete Right   Meds ordered this encounter  Medications  . oxyCODONE-acetaminophen (PERCOCET/ROXICET) 5-325 MG tablet    Sig: Take 1 tablet by mouth every 8 (eight) hours as needed for moderate pain or severe pain.    Dispense:  20 tablet    Refill:  0     Procedures: No procedures performed  Clinical Data: No additional findings.  ROS:  All other systems negative, except as noted in the HPI. Review of Systems  Objective: Vital Signs: Ht 6\' 1"  (1.854 m)   Wt 245 lb (111.1 kg)   BMI 32.32 kg/m   Specialty Comments:  No specialty comments available.  PMFS History: Patient Active Problem List   Diagnosis Date Noted  . Status post ankle fusion 04/16/2018  . Nonunion of subtalar arthrodesis   . Nonunion of osteotomy site   . Back pain   . Rhabdomyolysis 08/16/2017  . Syncope 05/29/2017  . Bipolar disorder (HCC) 05/29/2017  . Schizophrenia (HCC) 05/29/2017  . TBI (traumatic brain injury) (HCC) 05/29/2017  . Unwitnessed fall 05/29/2017  . Alcohol abuse with intoxication (HCC) 05/29/2017  .  Slurred speech 05/29/2017  . Chest pain 08/07/2016  . Cocaine use 08/07/2016  . Chronic musculoskeletal pain 08/07/2016  . CKD (chronic kidney disease) 08/07/2016  . Depression with anxiety 08/07/2016  . Traumatic arthritis of right ankle 07/31/2016  . 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 Osteoarthritis of right subtalar joint   . Right ankle pain 01/24/2016  . Bilateral hip pain 01/03/2016  . Atrial fibrillation, chronic 11/18/2015  . Essential hypertension 11/18/2015  . Hyperlipidemia 11/18/2015  . DVT, bilateral lower limbs (HCC) 11/18/2015  . Pulmonary embolus (HCC) 11/18/2015   Past Medical History:  Diagnosis Date  . Anemia    takes Iron pill daily   . Anxiety    takes Xanax daily as needed  . Arthritis   . Atrial fibrillation (HCC)    takes Eliquis daily. Last dose will be 10/03/16  . Bipolar disorder (HCC)   . Depression    takes Risperdal daily  . DVT (deep venous thrombosis) (HCC) 2016   BLE  . Dyspnea    when laying on back, with extertion  . Dysrhythmia    Afib  . Gout    takes Allopurinol daily  . Head injury   . Headache    occasionally  . History of hiatal hernia   . History of shingles 2010  . Hyperlipidemia    takes Atorvastatin daily  . Hyperlipidemia    takes Atorvastatin daily  . Hypertension    takes Metoprolol daily  . Insomnia    takes Ambien and Trazodone nightly  . Joint pain   . Joint swelling    right ankle  . Memory impairment   . 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 Nocturia   . Pulmonary embolism (HCC) 2016  . Schizophrenia (HCC)   . Seizures (HCC)    last one in 2002    Family History  Problem Relation Age of Onset  . Heart Problems Mother   . Heart attack Father        age 1940s    Past Surgical History:  Procedure Laterality Date  . ANKLE ARTHROSCOPY Right 04/20/2016   Procedure: ANKLE ARTHROSCOPY;  Surgeon: Nadara MustardMarcus V Jemiah Cuadra, MD;  [facility];  Service: Orthopedics;  Laterality: Right;  . ANKLE ARTHROSCOPY WITH FUSION Right 10/05/2016   Procedure: RIGHT ANKLE ARTHROSCOPIC FUSION;  Surgeon: Nadara Mustarduda, Piper Albro V, MD;  [facility];  Service: Orthopedics;  Laterality: Right;  . ANKLE FUSION Right 04/20/2016   Procedure: Right Posterior Arthroscopic Subtalar Arthrodesis;  Surgeon: Nadara MustardMarcus V Amesha Bailey, MD;  [facility];  Service: Orthopedics;  Laterality: Right;  . ANKLE FUSION Right 04/16/2018   Procedure: REVISION ANKLE FUSION, OPEN, ANTERIOR APPROACH;  Surgeon: Nadara Mustarduda, Vern Guerette V, MD;  [facility];  Service: Orthopedics;  Laterality: Right;  . ANKLE SURGERY Right 04/16/2018   REVISION ANKLE FUSION, OPEN, ANTERIOR APPROACH  . CARDIAC CATHETERIZATION Right 2016   [facility]  . COLONOSCOPY WITH PROPOFOL N/A 11/27/2016   Procedure: COLONOSCOPY WITH PROPOFOL;  Surgeon: Charolett BumpersJohnson, Martin K, MD;  [facility];  Service: Endoscopy;  Laterality: N/A;  . HERNIA REPAIR     1986-umbilical  . RIGHT HEART CATH  2016   [facility]   Social History   Occupational History  . Occupation: disabled  Tobacco Use  . Smoking status: Never Smoker  . Smokeless tobacco: Never Used  Substance and Sexual Activity  . Alcohol  use: Yes    Comment: varies,  bringe drinker - per wife   . Drug use: Not Currently  . Sexual activity: Not on file

## 2019-01-05 ENCOUNTER — Encounter: Payer: Self-pay | Admitting: Orthopedic Surgery

## 2019-01-05 ENCOUNTER — Telehealth: Payer: Self-pay | Admitting: Orthopedic Surgery

## 2019-01-05 NOTE — Telephone Encounter (Signed)
I called the patient back and spoke with his wife.  Patient's hemoglobin is not significantly low.  We reviewed his labs his albumin is a little low his liver function tests have been elevated.  Discussed that he has had some slightly elevated glucose and recommended a glucose tolerance test.  Patient states she has a appointment with his primary care physician this week and recommended that they obtain a glucose tolerance test.  There is no family history of diabetes.  Also discussed recommendations the last visit for a cast.  Patient's wife states that he has no pain with is in a cast.  Discussed that this is decreasing micromotion and should help improve healing recommend a follow-up this week for a short leg walking cast.  We discussed stem cells as well as  electrical stimulation and feel like the cast and evaluation for the possibility of diabetes would be the most productive.

## 2019-01-26 ENCOUNTER — Ambulatory Visit: Payer: Medicaid Other | Admitting: Orthopedic Surgery

## 2019-03-28 IMAGING — DX DG LUMBAR SPINE 2-3V
3 series · 3 of 3 positions shown · non-contrast
Comparison: Reformats from abdominal CT 08/16/2017

CLINICAL DATA: Lumbar back pain.

EXAM:
LUMBAR SPINE - 2-3 VIEW

[l-spine ap]
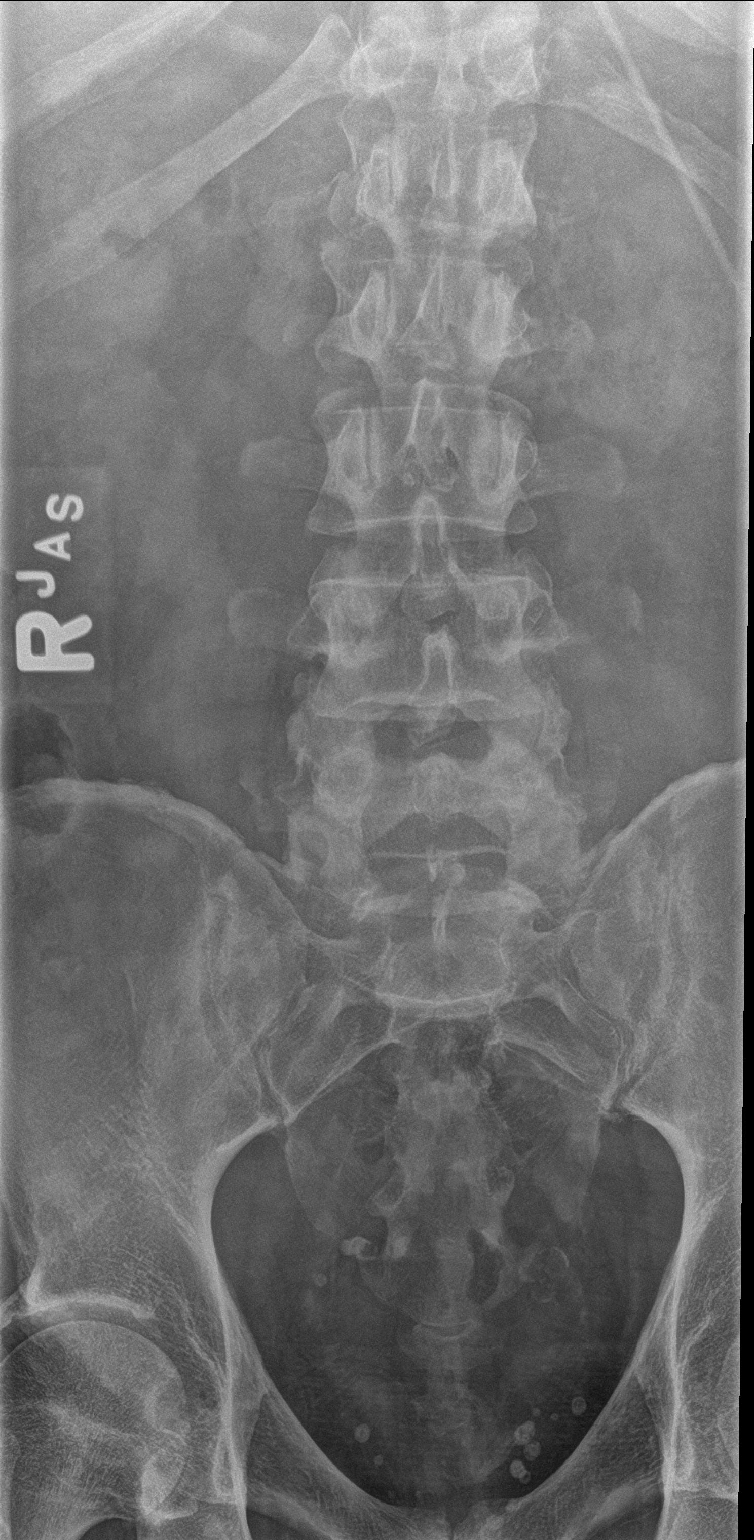

[l-spine lat]
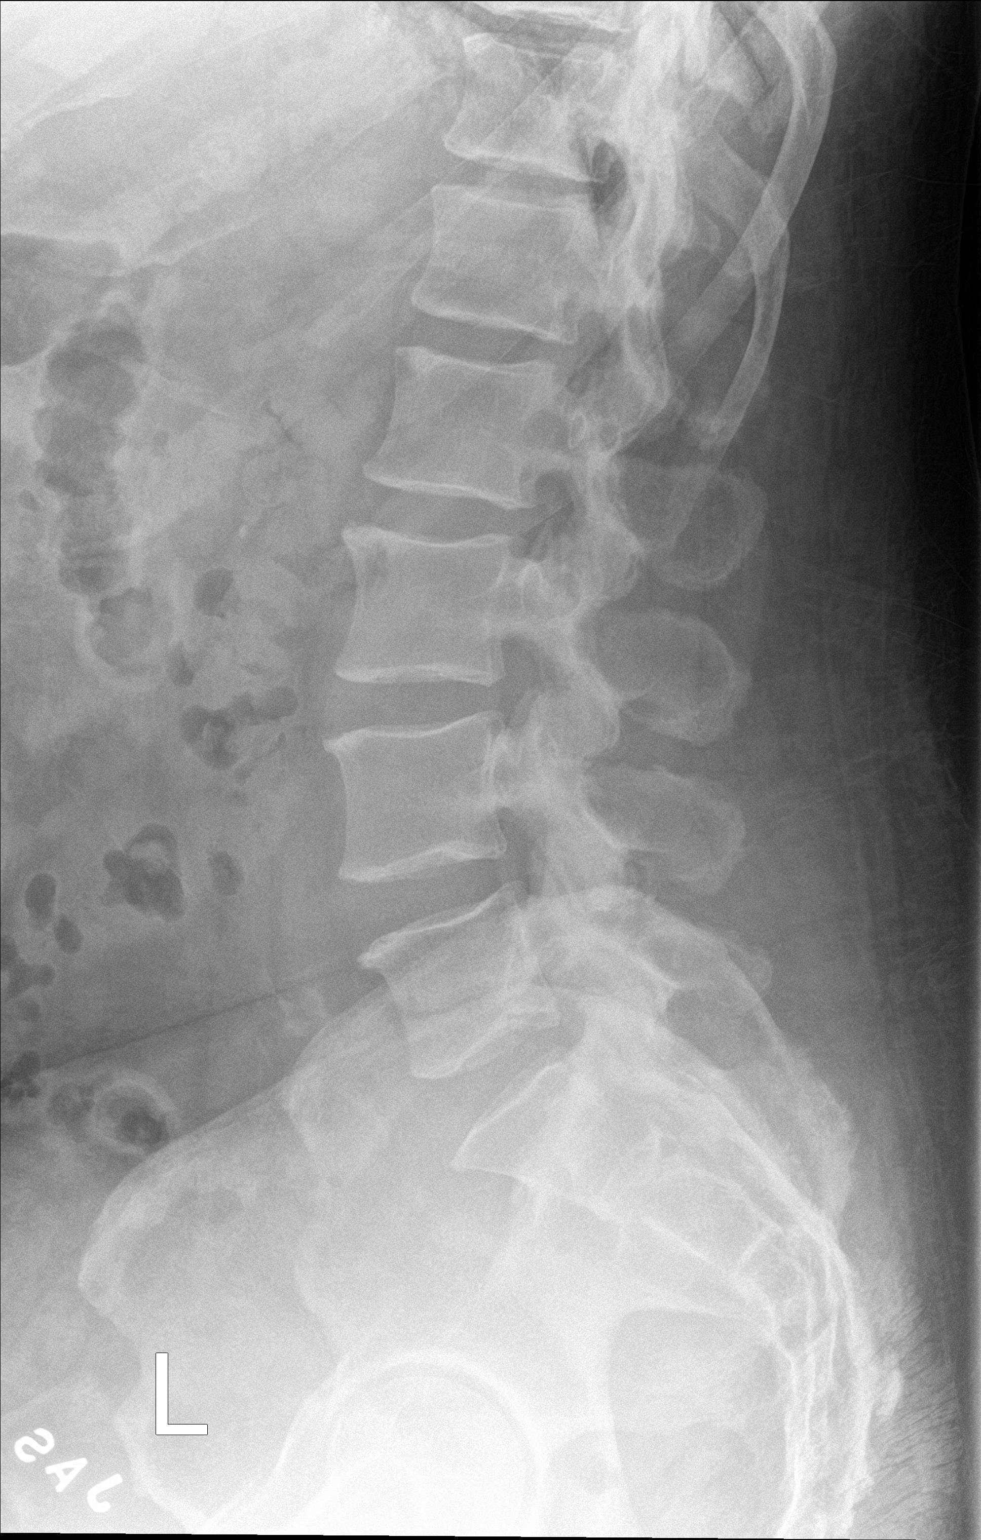

[l-spine spot]
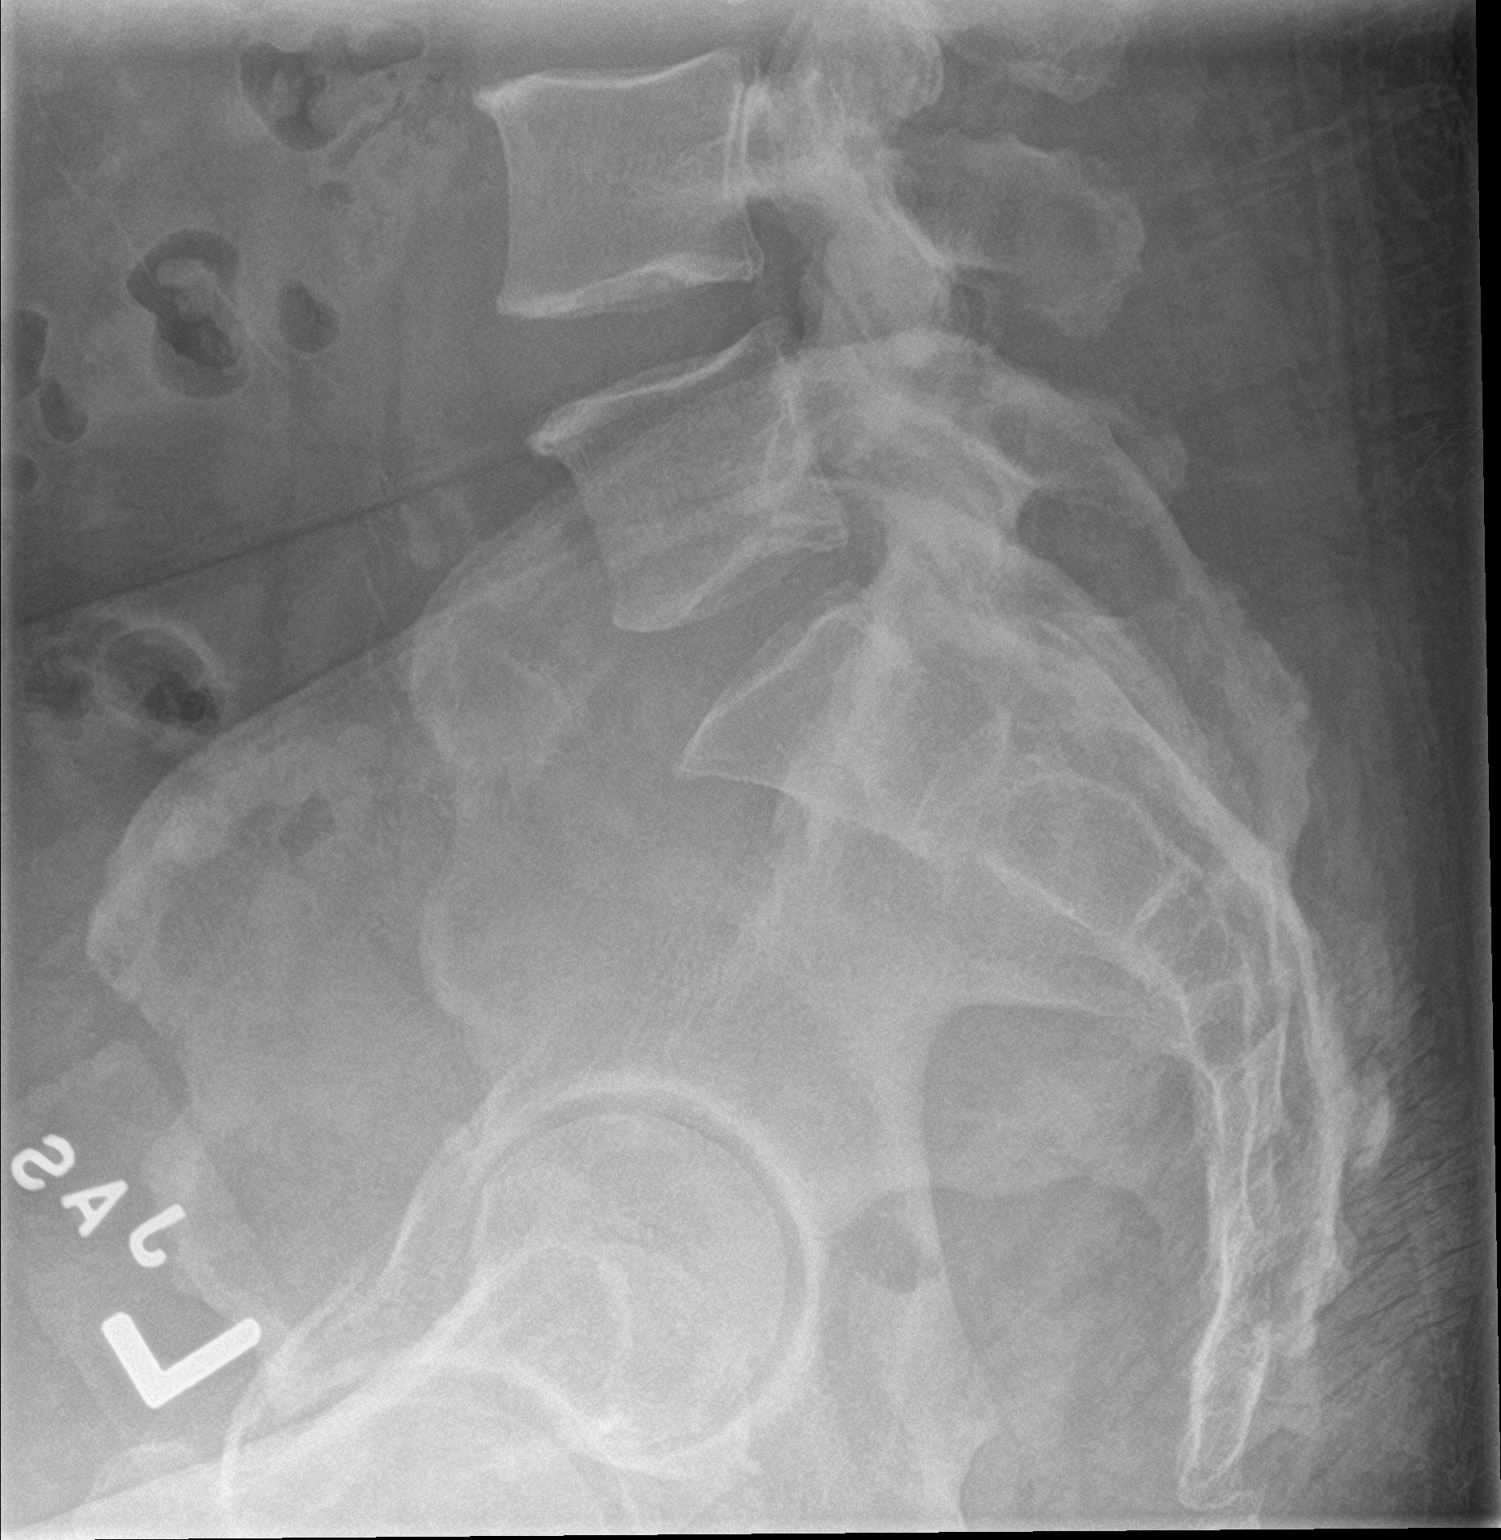

[3 of 3 positions shown; findings below may reference images not displayed]

FINDINGS: The alignment is maintained. Vertebral body heights are normal.
There is no listhesis. The posterior elements are intact. Mild
endplate spurring with preservation of disc spaces. No fracture.
There is facet arthropathy at L4-L5 and L5-S1. Sacroiliac joints are
symmetric and normal.
IMPRESSION: 1. Mild spondylosis and facet arthropathy in the lumbar spine.
2. No evidence of acute abnormality.

## 2019-04-27 ENCOUNTER — Ambulatory Visit: Payer: Medicaid Other | Admitting: Physician Assistant

## 2019-05-12 ENCOUNTER — Ambulatory Visit: Payer: Medicaid Other | Admitting: Cardiovascular Disease

## 2019-05-14 ENCOUNTER — Ambulatory Visit: Payer: Medicaid Other | Admitting: Cardiovascular Disease

## 2019-05-31 ENCOUNTER — Emergency Department (HOSPITAL_COMMUNITY): Payer: Medicaid Other

## 2019-05-31 ENCOUNTER — Inpatient Hospital Stay (HOSPITAL_COMMUNITY)
Admission: EM | Admit: 2019-05-31 | Discharge: 2019-06-02 | DRG: 918 | Disposition: A | Discharge status: Home / Self Care | Payer: Medicaid Other | Attending: Pulmonary Disease | Admitting: Pulmonary Disease

## 2019-05-31 DIAGNOSIS — G4700 Insomnia, unspecified: Secondary | ICD-10-CM | POA: Diagnosis present

## 2019-05-31 DIAGNOSIS — Z20828 Contact with and (suspected) exposure to other viral communicable diseases: Secondary | ICD-10-CM | POA: Diagnosis present

## 2019-05-31 DIAGNOSIS — Z981 Arthrodesis status: Secondary | ICD-10-CM

## 2019-05-31 DIAGNOSIS — M109 Gout, unspecified: Secondary | ICD-10-CM | POA: Diagnosis present

## 2019-05-31 DIAGNOSIS — Z79899 Other long term (current) drug therapy: Secondary | ICD-10-CM

## 2019-05-31 DIAGNOSIS — Y900 Blood alcohol level of less than 20 mg/100 ml: Secondary | ICD-10-CM | POA: Diagnosis present

## 2019-05-31 DIAGNOSIS — Z8249 Family history of ischemic heart disease and other diseases of the circulatory system: Secondary | ICD-10-CM

## 2019-05-31 DIAGNOSIS — E785 Hyperlipidemia, unspecified: Secondary | ICD-10-CM | POA: Diagnosis present

## 2019-05-31 DIAGNOSIS — Z791 Long term (current) use of non-steroidal anti-inflammatories (NSAID): Secondary | ICD-10-CM

## 2019-05-31 DIAGNOSIS — Z7901 Long term (current) use of anticoagulants: Secondary | ICD-10-CM

## 2019-05-31 DIAGNOSIS — T428X1A Poisoning by antiparkinsonism drugs and other central muscle-tone depressants, accidental (unintentional), initial encounter: Principal | ICD-10-CM | POA: Diagnosis present

## 2019-05-31 DIAGNOSIS — G8929 Other chronic pain: Secondary | ICD-10-CM | POA: Diagnosis present

## 2019-05-31 DIAGNOSIS — R001 Bradycardia, unspecified: Secondary | ICD-10-CM

## 2019-05-31 DIAGNOSIS — M25571 Pain in right ankle and joints of right foot: Secondary | ICD-10-CM | POA: Diagnosis present

## 2019-05-31 DIAGNOSIS — T50901A Poisoning by unspecified drugs, medicaments and biological substances, accidental (unintentional), initial encounter: Secondary | ICD-10-CM | POA: Diagnosis present

## 2019-05-31 DIAGNOSIS — I4891 Unspecified atrial fibrillation: Secondary | ICD-10-CM | POA: Diagnosis present

## 2019-05-31 DIAGNOSIS — F209 Schizophrenia, unspecified: Secondary | ICD-10-CM | POA: Diagnosis present

## 2019-05-31 DIAGNOSIS — Z86711 Personal history of pulmonary embolism: Secondary | ICD-10-CM

## 2019-05-31 DIAGNOSIS — F319 Bipolar disorder, unspecified: Secondary | ICD-10-CM | POA: Diagnosis present

## 2019-05-31 DIAGNOSIS — F419 Anxiety disorder, unspecified: Secondary | ICD-10-CM | POA: Diagnosis present

## 2019-05-31 DIAGNOSIS — I10 Essential (primary) hypertension: Secondary | ICD-10-CM | POA: Diagnosis present

## 2019-05-31 DIAGNOSIS — R780 Finding of alcohol in blood: Secondary | ICD-10-CM | POA: Diagnosis present

## 2019-05-31 DIAGNOSIS — Z86718 Personal history of other venous thrombosis and embolism: Secondary | ICD-10-CM

## 2019-05-31 DIAGNOSIS — I952 Hypotension due to drugs: Secondary | ICD-10-CM | POA: Diagnosis present

## 2019-05-31 DIAGNOSIS — I959 Hypotension, unspecified: Secondary | ICD-10-CM

## 2019-05-31 DIAGNOSIS — Z8619 Personal history of other infectious and parasitic diseases: Secondary | ICD-10-CM

## 2019-05-31 LAB — CBC WITH DIFFERENTIAL/PLATELET
Abs Immature Granulocytes: 0.03 10*3/uL (ref 0.00–0.07)
Basophils Absolute: 0 10*3/uL (ref 0.0–0.1)
Basophils Relative: 0 %
Eosinophils Absolute: 0.1 10*3/uL (ref 0.0–0.5)
Eosinophils Relative: 1 %
HCT: 31.4 % — ABNORMAL LOW (ref 39.0–52.0)
Hemoglobin: 10.3 g/dL — ABNORMAL LOW (ref 13.0–17.0)
Immature Granulocytes: 1 %
Lymphocytes Relative: 34 %
Lymphs Abs: 1.5 10*3/uL (ref 0.7–4.0)
MCH: 23.6 pg — ABNORMAL LOW (ref 26.0–34.0)
MCHC: 32.8 g/dL (ref 30.0–36.0)
MCV: 71.9 fL — ABNORMAL LOW (ref 80.0–100.0)
Monocytes Absolute: 0.4 10*3/uL (ref 0.1–1.0)
Monocytes Relative: 9 %
Neutro Abs: 2.5 10*3/uL (ref 1.7–7.7)
Neutrophils Relative %: 55 %
Platelets: 175 10*3/uL (ref 150–400)
RBC: 4.37 MIL/uL (ref 4.22–5.81)
RDW: 14.1 % (ref 11.5–15.5)
WBC: 4.6 10*3/uL (ref 4.0–10.5)
nRBC: 0 % (ref 0.0–0.2)

## 2019-05-31 LAB — COMPREHENSIVE METABOLIC PANEL
ALT: 17 U/L (ref 0–44)
AST: 19 U/L (ref 15–41)
Albumin: 2.9 g/dL — ABNORMAL LOW (ref 3.5–5.0)
Alkaline Phosphatase: 38 U/L (ref 38–126)
Anion gap: 11 (ref 5–15)
BUN: 15 mg/dL (ref 6–20)
CO2: 20 mmol/L — ABNORMAL LOW (ref 22–32)
Calcium: 8.3 mg/dL — ABNORMAL LOW (ref 8.9–10.3)
Chloride: 107 mmol/L (ref 98–111)
Creatinine, Ser: 1.61 mg/dL — ABNORMAL HIGH (ref 0.61–1.24)
GFR calc Af Amer: 54 mL/min — ABNORMAL LOW (ref >60)
GFR calc non Af Amer: 47 mL/min — ABNORMAL LOW (ref >60)
Glucose, Bld: 104 mg/dL — ABNORMAL HIGH (ref 70–99)
Potassium: 3.8 mmol/L (ref 3.5–5.1)
Sodium: 138 mmol/L (ref 135–145)
Total Bilirubin: 0.2 mg/dL — ABNORMAL LOW (ref 0.3–1.2)
Total Protein: 5.6 g/dL — ABNORMAL LOW (ref 6.5–8.1)

## 2019-05-31 LAB — SALICYLATE LEVEL: Salicylate Lvl: <7 mg/dL — ABNORMAL LOW (ref 7.0–30.0)

## 2019-05-31 LAB — ACETAMINOPHEN LEVEL: Acetaminophen (Tylenol), Serum: <10 ug/mL — ABNORMAL LOW (ref 10–30)

## 2019-05-31 LAB — ETHANOL: Alcohol, Ethyl (B): 19 mg/dL — ABNORMAL HIGH (ref <10)

## 2019-05-31 MED ORDER — NOREPINEPHRINE 4 MG/250ML-% IV SOLN
0.0000 ug/min | INTRAVENOUS | Status: DC
  Administered 2019-05-31: 2 ug/min via INTRAVENOUS
  Filled 2019-05-31: qty 250

## 2019-05-31 MED ORDER — SODIUM CHLORIDE 0.9 % IV BOLUS
1000.0000 mL | Freq: Once | INTRAVENOUS | Status: AC
  Administered 2019-05-31: 1000 mL via INTRAVENOUS

## 2019-05-31 MED ORDER — SODIUM CHLORIDE 0.9 % IV SOLN: Freq: Once | INTRAVENOUS | Status: AC

## 2019-05-31 NOTE — ED Provider Notes (Signed)
[facility] EMERGENCY DEPARTMENT Provider Note   CSN: [id number] Arrival date & time: 05/31/19  2156     History Chief Complaint  Patient presents with  . Hypotension  . Drug Overdose    Garrett Ferrell is a 58 y.o. male with a past medical history of A. fib anticoagulated with Eliquis, bipolar, hypertension, schizophrenia, PE, TBI, who presents today for evaluation after ingestion.  He reports that he took a total of 8 mg of Tizanidine at around 730 (#4 2mg  tablets).  He states that this was not an attempt at self-harm, was simply that he wanted to sleep.  He reports that he had a mixed drink with orange juice prior to this, however denies any other ingestions or drug use.    It was noted to be hypotensive with EMS with a pressure of 71/45, heart rate in the 40s and was given 700 mL by EMS.  Patient denies any pain, he states that he feels like his entire body is numb.  He denies any specific weakness.  He denies taking any viagra.  He states he takes his Cardizem 24 hour every morning.    HPI     Past Medical History:  Diagnosis Date  . Anemia    takes Iron pill daily  . Anxiety    takes Xanax daily as needed  . Arthritis   . Atrial fibrillation (HCC)    takes Eliquis daily. Last dose will be 10/03/16  . Bipolar disorder (HCC)   . Depression    takes Risperdal daily  . DVT (deep venous thrombosis) (HCC) 2016   BLE  . Dyspnea    when laying on back, with extertion  . Dysrhythmia    Afib  . Gout    takes Allopurinol daily  . Head injury   . Headache    occasionally  . History of hiatal hernia   . History of shingles 2010  . Hyperlipidemia    takes Atorvastatin daily  . Hyperlipidemia    takes Atorvastatin daily  . Hypertension    takes Metoprolol daily  . Insomnia    takes Ambien and Trazodone nightly  . Joint pain   . Joint swelling    right ankle  . Memory impairment   . 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 Nocturia   . Pulmonary  embolism (HCC) 2016  . Schizophrenia (HCC)   . Seizures (HCC)    last one in 2002    Patient Active Problem List   Diagnosis Date Noted  . Status post ankle fusion 04/16/2018  . Nonunion of subtalar arthrodesis   . Nonunion of osteotomy site   . Back pain   . Rhabdomyolysis 08/16/2017  . Syncope 05/29/2017  . Bipolar disorder (HCC) 05/29/2017  . Schizophrenia (HCC) 05/29/2017  . TBI (traumatic brain injury) (HCC) 05/29/2017  . Unwitnessed fall 05/29/2017  . Alcohol abuse with intoxication (HCC) 05/29/2017  . Slurred speech 05/29/2017  . Chest pain 08/07/2016  . Cocaine use 08/07/2016  . Chronic musculoskeletal pain 08/07/2016  . CKD (chronic kidney disease) 08/07/2016  . Depression with anxiety 08/07/2016  . Traumatic arthritis of right ankle 07/31/2016  . 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 Osteoarthritis of right subtalar joint   . Right ankle pain 01/24/2016  . Bilateral hip pain 01/03/2016  . Atrial fibrillation, chronic (HCC) 11/18/2015  . Essential hypertension 11/18/2015  . Hyperlipidemia 11/18/2015  . DVT, bilateral lower limbs (HCC) 11/18/2015  . Pulmonary  embolus (HCC) 11/18/2015    Past Surgical History:  Procedure Laterality Date  . ANKLE ARTHROSCOPY Right 04/20/2016   Procedure: ANKLE ARTHROSCOPY;  Surgeon: Nadara Mustard, MD;  Location: [facility] OR;  Service: Orthopedics;  Laterality: Right;  . ANKLE ARTHROSCOPY WITH FUSION Right 10/05/2016   Procedure: RIGHT ANKLE ARTHROSCOPIC FUSION;  Surgeon: Nadara Mustard, MD;  Location: [facility] OR;  Service: Orthopedics;  Laterality: Right;  . ANKLE FUSION Right 04/20/2016   Procedure: Right Posterior Arthroscopic Subtalar Arthrodesis;  Surgeon: Nadara Mustard, MD;  Location: [facility] OR;  Service: Orthopedics;  Laterality: Right;  . ANKLE FUSION Right 04/16/2018   Procedure: REVISION ANKLE FUSION, OPEN, ANTERIOR APPROACH;  Surgeon: Nadara Mustard, MD;  Location: [facility] OR;  Service: Orthopedics;  Laterality: Right;  . ANKLE  SURGERY Right 04/16/2018   REVISION ANKLE FUSION, OPEN, ANTERIOR APPROACH  . CARDIAC CATHETERIZATION Right 2016   at [facility] during his DVT/PE  . COLONOSCOPY WITH PROPOFOL N/A 11/27/2016   Procedure: COLONOSCOPY WITH PROPOFOL;  Surgeon: Charolett Bumpers, MD;  Location: [facility] ENDOSCOPY;  Service: Endoscopy;  Laterality: N/A;  . HERNIA REPAIR     1986-umbilical  . RIGHT HEART CATH  2016   at [facility] during his DVT/PE       Family History  Problem Relation Age of Onset  . Heart Problems Mother   . Heart attack Father        age 43s    Social History   Tobacco Use  . Smoking status: Never Smoker  . Smokeless tobacco: Never Used  Substance Use Topics  . Alcohol use: Yes    Comment: varies,  bringe drinker - per wife   . Drug use: Not Currently    Home Medications Prior to Admission medications   Medication Sig Start Date End Date Taking? Authorizing Provider  allopurinol (ZYLOPRIM) 100 MG tablet Take 100 mg by mouth daily. 12/22/15   [provider]  ALPRAZolam Prudy Feeler) 0.5 MG tablet Take 0.5 mg by mouth every 8 (eight) hours as needed for anxiety.  10/21/15   [provider]  atorvastatin (LIPITOR) 10 MG tablet Take 10 mg by mouth every evening.     [provider]  diltiazem (CARDIZEM CD) 240 MG 24 hr capsule Take 1 capsule (240 mg total) by mouth daily. 08/09/16   Joseph Art, DO  ELIQUIS 5 MG TABS tablet TAKE 1 TABLET BY MOUTH TWICE A DAY 12/19/18   Runell Gess, MD  ferrous sulfate 325 (65 FE) MG EC tablet Take 325 mg by mouth daily with breakfast.    [provider]  gabapentin (NEURONTIN) 300 MG capsule Take 1 capsule (300 mg total) by mouth 2 (two) times daily. 08/20/17   Maxie Barb, MD  losartan (COZAAR) 25 MG tablet Take 25 mg by mouth daily. 09/24/16   [provider]  meloxicam (MOBIC) 15 MG tablet Take 15 mg by mouth daily. 07/29/17   [provider]   methocarbamol (ROBAXIN) 750 MG tablet Take 1 tablet (750 mg total) by mouth every 8 (eight) hours as needed for muscle spasms. 08/20/17   Maxie Barb, MD  Multiple Vitamin (MULTIVITAMIN WITH MINERALS) TABS tablet Take 1 tablet by mouth daily. 08/21/17   Maxie Barb, MD  oxyCODONE-acetaminophen (PERCOCET/ROXICET) 5-325 MG tablet Take 1 tablet by mouth every 4 (four) hours as needed for severe pain. 11/27/18   Nadara Mustard, MD  oxyCODONE-acetaminophen (PERCOCET/ROXICET) 5-325 MG tablet Take  1 tablet by mouth every 8 (eight) hours as needed for moderate pain or severe pain. 12/25/18   [name], MD  QUEtiapine (SEROQUEL XR) 200 MG 24 hr tablet Take 200 mg by mouth at bedtime. 03/19/19   [provider]  QUEtiapine (SEROQUEL XR) 50 MG TB24 24 hr tablet Take 150 mg by mouth at bedtime.     [provider]  risperiDONE (RISPERDAL) 1 MG tablet Take 1-2 tablets (1-2 mg total) by mouth 2 (two) times daily. Take 1tab (1mg ) in morning and 2 tabs (2mg )at night Patient taking differently: Take 1-2 mg by mouth See admin instructions. Take 1 mg by mouth in morning and take 2 mg by mouth at bedtime 06/01/17   Domenic Polite, MD  tiZANidine (ZANAFLEX) 2 MG tablet Take 2-4 mg by mouth every 8 (eight) hours as needed for muscle spasms.  07/27/17   [provider]  traZODone (DESYREL) 150 MG tablet Take 150 mg by mouth at bedtime. 03/23/19   [provider]  trazodone (DESYREL) 300 MG tablet Take 300 mg by mouth at bedtime. 10/24/15   [provider]  VIAGRA 100 MG tablet Take 100 mg by mouth daily as needed for erectile dysfunction.  09/30/15   [provider]  zolpidem (AMBIEN) 10 MG tablet Take 1 tablet (10 mg total) by mouth at bedtime as needed for sleep. Patient taking differently: Take 10 mg by mouth at bedtime.  06/01/17   Domenic Polite, MD    Allergies    Patient has no known allergies.  Review of Systems   Review of Systems   Constitutional: Negative for chills and fever.  HENT: Negative for congestion.   Eyes: Negative for visual disturbance.  Respiratory: Negative for cough, chest tightness and shortness of breath.   Cardiovascular: Negative for chest pain.  Gastrointestinal: Negative for abdominal pain.  Musculoskeletal: Negative for back pain and neck pain.  Skin: Negative for color change, rash and wound.  Neurological:       "I feel like my whole body is numb."  Psychiatric/Behavioral: Positive for sleep disturbance. Negative for behavioral problems, confusion and suicidal ideas.  All other systems reviewed and are negative.   Physical Exam Updated Vital Signs BP (!) 85/66   Pulse (!) 49   Temp 98.3 F (36.8 C) (Oral)   Resp (!) 28   Ht 6' (1.829 m)   Wt 106.6 kg   SpO2 97%   BMI 31.87 kg/m   Physical Exam Vitals and nursing note reviewed.  Constitutional:      General: He is not in acute distress.    Appearance: He is well-developed. He is not diaphoretic.     Comments: He is awake and alert, able to answer questions without difficulty.  HENT:     Head: Atraumatic.     Comments: Wrinkled appearance of forehead    Mouth/Throat:     Mouth: Mucous membranes are moist.  Eyes:     General: No scleral icterus.       Right eye: No discharge.        Left eye: No discharge.     Conjunctiva/sclera: Conjunctivae normal.     Pupils: Pupils are equal, round, and reactive to light.  Cardiovascular:     Rate and Rhythm: Bradycardia present. Rhythm irregular.     Pulses:          Radial pulses are 1+ on the right side and 1+ on the left side.     Heart  sounds: Normal heart sounds.  Pulmonary:     Effort: Pulmonary effort is normal. No respiratory distress.     Breath sounds: Normal breath sounds. No stridor.  Abdominal:     General: There is no distension.     Tenderness: There is no abdominal tenderness.  Musculoskeletal:        General: No deformity.     Cervical back: Normal range of  motion and neck supple.     Right lower leg: No edema.     Left lower leg: No edema.  Skin:    General: Skin is warm and dry.  Neurological:     General: No focal deficit present.     Mental Status: He is alert and oriented to person, place, and time.     GCS: GCS eye subscore is 4. GCS verbal subscore is 5. GCS motor subscore is 6.     Comments: He is "twitchy" with frequent sudden movements when stimulated.  No seizure activity.   Psychiatric:        Mood and Affect: Mood normal.        Behavior: Behavior normal.     ED Results / Procedures / Treatments   Labs (all labs ordered are listed, but only abnormal results are displayed) Labs Reviewed  COMPREHENSIVE METABOLIC PANEL - Abnormal; Notable for the following components:      Result Value   CO2 20 (*)    Glucose, Bld 104 (*)    Creatinine, Ser 1.61 (*)    Calcium 8.3 (*)    Total Protein 5.6 (*)    Albumin 2.9 (*)    Total Bilirubin 0.2 (*)    GFR calc non Af Amer 47 (*)    GFR calc Af Amer 54 (*)    All other components within normal limits  CBC WITH DIFFERENTIAL/PLATELET - Abnormal; Notable for the following components:   Hemoglobin 10.3 (*)    HCT 31.4 (*)    MCV 71.9 (*)    MCH 23.6 (*)    All other components within normal limits  SALICYLATE LEVEL - Abnormal; Notable for the following components:   Salicylate Lvl <7.0 (*)    All other components within normal limits  ACETAMINOPHEN LEVEL - Abnormal; Notable for the following components:   Acetaminophen (Tylenol), Serum <10 (*)    All other components within normal limits  ETHANOL - Abnormal; Notable for the following components:   Alcohol, Ethyl (B) 19 (*)    All other components within normal limits  RESPIRATORY PANEL BY RT PCR (FLU A&B, COVID)  RAPID URINE DRUG SCREEN, HOSP PERFORMED    EKG EKG Interpretation  Date/Time:  Sunday May 31 2019 22:00:45 EST Ventricular Rate:  49 PR Interval:    QRS Duration: 152 QT Interval:  499 QTC Calculation:  451 R Axis:   -64 Text Interpretation: Atrial fibrillation Right bundle branch block Inferior infarct, old Confirmed by Virgina Norfolk [phone]) on 05/31/2019 10:11:23 PM   Radiology DG Chest Portable 1 View  Result Date: 05/31/2019 CLINICAL DATA:  Overdose EXAM: PORTABLE CHEST 1 VIEW COMPARISON:  Oct 03, 2016 FINDINGS: The heart size and mediastinal contours is borderline enlarged. Both lungs are clear. The visualized skeletal structures are unremarkable. IMPRESSION: No active disease. Electronically Signed   By: Jonna Clark M.D.   On: 05/31/2019 23:14    Procedures .Critical Care Performed by: Cristina Gong, PA-C Authorized by: Cristina Gong, PA-C   Critical care provider statement:    Critical care  time (minutes):  45   Critical care was necessary to treat or prevent imminent or life-threatening deterioration of the following conditions:  Shock, toxidrome and circulatory failure   Critical care was time spent personally by me on the following activities:  Discussions with consultants, evaluation of patient's response to treatment, examination of patient, ordering and performing treatments and interventions, ordering and review of laboratory studies, ordering and review of radiographic studies, pulse oximetry, re-evaluation of patient's condition, obtaining history from patient or surrogate and review of old charts   (including critical care time)  Medications Ordered in ED Medications  norepinephrine (LEVOPHED) 4mg  in 250mL premix infusion (5 mcg/min Intravenous Rate/Dose Change 05/31/19 2308)  sodium chloride 0.9 % bolus 1,000 mL (0 mLs Intravenous Stopped 05/31/19 2258)  sodium chloride 0.9 % bolus 1,000 mL (0 mLs Intravenous Stopped 05/31/19 2239)  sodium chloride 0.9 % bolus 1,000 mL (0 mLs Intravenous Stopped 05/31/19 2308)  0.9 %  sodium chloride infusion ( Intravenous New Bag/Given 05/31/19 2313)    ED Course  I have reviewed the triage vital signs and the  nursing notes.  Pertinent labs & imaging results that were available during my care of the patient were reviewed by me and considered in my medical decision making (see chart for details).  Clinical Course as of Dec 28 0001  Sun May 31, 2019  2216 [facility]  They recommend treating with fluids.  They f state that this should act like clonidine and can cause bradycardia and hypotension.  Can give atropine as needed for significant low heart rate, especially if he becomes symptomatic or has mental status changes.  If he does not respond to fluids or if needed Levophed would be the preferred pressor of choice.  They recommended that if he is somnolent frequent physical stimulation to help keep patient awake can help with blood pressure.  They recommended 6 to 8 hours of observation.     [EH]    Clinical Course User Index [EH] Norman ClayHammond, Aerilyn Slee W, PA-C   MDM Rules/Calculators/A&P                     Patient presents today for evaluation of an overdose.  He reportedly took a total of 8 mg of tinizidine and in an attempt to sleep.  With EMS he was noted to be bradycardic into the 40s and hypotensive with pressures into the 60s systolic.  He was given 700 mL of fluid by EMS in route.  He is given an additional 3 L of fluid here.  I spoke with poison control who recommended atropine if needed for significant bradycardia primarily if he becomes symptomatic or has mental status changes.  They recommended Levophed as a preferred pressor of choice.  Given that patient had gotten multiple liters without significant change in his blood pressure he was started on Levophed IV.   Ethanol level is not significantly elevated at 19.  Salicylate and acetaminophen levels are undetectable.  CMP shows slightly elevated creatinine at 1.6 up from 1.  His GFR is decreased to 54.    This patient was seen as a shared visit with Dr. Lockie Molauratolo.    Final Clinical Impression(s) / ED Diagnoses  Final diagnoses:  Accidental drug overdose, initial encounter  Hypotension, unspecified hypotension type    Rx / DC Orders ED Discharge Orders    None       Cristina GongHammond, Halford Goetzke W, PA-C 06/01/19 0020    Curatolo,  Adam, DO 06/01/19 1610

## 2019-05-31 NOTE — ED Provider Notes (Signed)
Medical screening examination/treatment/procedure(s) were conducted as a shared visit with non-physician practitioner(s) and myself.  I personally evaluated the patient during the encounter. Briefly, the patient is a 58 y.o. male with history of atrial fibrillation on Eliquis who presents to the ED with accidental overdose on Zanaflex.  Between the hours of 730 and 9 patient took 8 mg of Zanaflex to help him sleep.  He has been on multiple sleep aids in the past including Ambien and trazodone.  He does take diltiazem for atrial fibrillation.  However, he states that this is the only medication that he took tonight.  He does not have prescriptions for Ambien or trazodone or other sleep aids.  He took his diltiazem early this morning.  Patient was difficult to arouse at home after having difficulty sleeping and the wife called EMS.  He was found to be hypotensive in the 60s.  Heart rate was in the 50s.  EKG shows slow atrial fibrillation which is overall unchanged from prior EKGs.  Patient was given 4 L of IV fluids with minimal change in blood pressure.  Poison control was called and they recommend after aggressive fluid resuscitation to start Levophed as a pressor of choice.  They recommend atropine for mental status changes and for symptomatic bradycardia.  Patient overall had unremarkable lab work.  Mild AKI.  Salicylate level unremarkable.  Tylenol level unremarkable.  Alcohol level mildly elevated.  Does admit to alcohol use.  Chest x-ray shows no active disease.  Patient started on maintenance fluids.  IV Levophed has been started and blood pressure slowly normalized to 16X systolic with a MAP greater than 65 on 5 mcg of Levophed.  Overall poison control recommended about an 8-hour obvious however given his other medications suspect that hypotension may last little longer.  Will talk with ICU about admission given need for Levophed.  This chart was dictated using voice recognition software.  Despite best  efforts to proofread,  errors can occur which can change the documentation meaning.   .Critical Care Performed by: Lennice Sites, DO Authorized by: Lennice Sites, DO   Critical care provider statement:    Critical care time (minutes):  45   Critical care was necessary to treat or prevent imminent or life-threatening deterioration of the following conditions:  Circulatory failure and toxidrome   Critical care was time spent personally by me on the following activities:  Blood draw for specimens, development of treatment plan with patient or surrogate, discussions with primary provider, evaluation of patient's response to treatment, examination of patient, obtaining history from patient or surrogate, ordering and performing treatments and interventions, ordering and review of laboratory studies, pulse oximetry, ordering and review of radiographic studies, re-evaluation of patient's condition and review of old charts   I assumed direction of critical care for this patient from another provider in my specialty: no       EKG Interpretation  Date/Time:  Sunday May 31 2019 22:00:45 EST Ventricular Rate:  49 PR Interval:    QRS Duration: 152 QT Interval:  499 QTC Calculation: 451 R Axis:   -64 Text Interpretation: Atrial fibrillation Right bundle branch block Inferior infarct, old Confirmed by Lennice Sites [phone]) on 05/31/2019 10:11:23 PM           Lennice Sites, DO 05/31/19 2337

## 2019-05-31 NOTE — ED Triage Notes (Signed)
Pt came in [place] post accidental overdose causing hypotension. Pt took (4)  2mg  Tizanidine HCL tablets around 1930. BP 71/45, 40HR, 107CBG.  Received 752ml NS Bolus, Hx of Afib.

## 2019-06-01 ENCOUNTER — Encounter (HOSPITAL_COMMUNITY): Payer: Self-pay | Admitting: Internal Medicine

## 2019-06-01 ENCOUNTER — Other Ambulatory Visit: Payer: Self-pay

## 2019-06-01 DIAGNOSIS — Z981 Arthrodesis status: Secondary | ICD-10-CM | POA: Diagnosis not present

## 2019-06-01 DIAGNOSIS — G4700 Insomnia, unspecified: Secondary | ICD-10-CM | POA: Diagnosis present

## 2019-06-01 DIAGNOSIS — I959 Hypotension, unspecified: Secondary | ICD-10-CM | POA: Diagnosis present

## 2019-06-01 DIAGNOSIS — Y900 Blood alcohol level of less than 20 mg/100 ml: Secondary | ICD-10-CM | POA: Diagnosis present

## 2019-06-01 DIAGNOSIS — M25571 Pain in right ankle and joints of right foot: Secondary | ICD-10-CM | POA: Diagnosis present

## 2019-06-01 DIAGNOSIS — T50901A Poisoning by unspecified drugs, medicaments and biological substances, accidental (unintentional), initial encounter: Secondary | ICD-10-CM | POA: Diagnosis present

## 2019-06-01 DIAGNOSIS — T428X1A Poisoning by antiparkinsonism drugs and other central muscle-tone depressants, accidental (unintentional), initial encounter: Secondary | ICD-10-CM | POA: Diagnosis present

## 2019-06-01 DIAGNOSIS — E785 Hyperlipidemia, unspecified: Secondary | ICD-10-CM | POA: Diagnosis present

## 2019-06-01 DIAGNOSIS — Z8619 Personal history of other infectious and parasitic diseases: Secondary | ICD-10-CM | POA: Diagnosis not present

## 2019-06-01 DIAGNOSIS — G8929 Other chronic pain: Secondary | ICD-10-CM | POA: Diagnosis present

## 2019-06-01 DIAGNOSIS — R001 Bradycardia, unspecified: Secondary | ICD-10-CM | POA: Diagnosis present

## 2019-06-01 DIAGNOSIS — Z791 Long term (current) use of non-steroidal anti-inflammatories (NSAID): Secondary | ICD-10-CM | POA: Diagnosis not present

## 2019-06-01 DIAGNOSIS — F419 Anxiety disorder, unspecified: Secondary | ICD-10-CM | POA: Diagnosis present

## 2019-06-01 DIAGNOSIS — F319 Bipolar disorder, unspecified: Secondary | ICD-10-CM | POA: Diagnosis present

## 2019-06-01 DIAGNOSIS — I952 Hypotension due to drugs: Secondary | ICD-10-CM | POA: Diagnosis present

## 2019-06-01 DIAGNOSIS — R780 Finding of alcohol in blood: Secondary | ICD-10-CM | POA: Diagnosis present

## 2019-06-01 DIAGNOSIS — I10 Essential (primary) hypertension: Secondary | ICD-10-CM | POA: Diagnosis present

## 2019-06-01 DIAGNOSIS — F209 Schizophrenia, unspecified: Secondary | ICD-10-CM | POA: Diagnosis present

## 2019-06-01 DIAGNOSIS — Z8249 Family history of ischemic heart disease and other diseases of the circulatory system: Secondary | ICD-10-CM | POA: Diagnosis not present

## 2019-06-01 DIAGNOSIS — Z79899 Other long term (current) drug therapy: Secondary | ICD-10-CM | POA: Diagnosis not present

## 2019-06-01 DIAGNOSIS — Z7901 Long term (current) use of anticoagulants: Secondary | ICD-10-CM | POA: Diagnosis not present

## 2019-06-01 DIAGNOSIS — I4891 Unspecified atrial fibrillation: Secondary | ICD-10-CM | POA: Diagnosis present

## 2019-06-01 DIAGNOSIS — M109 Gout, unspecified: Secondary | ICD-10-CM | POA: Diagnosis present

## 2019-06-01 DIAGNOSIS — Z20828 Contact with and (suspected) exposure to other viral communicable diseases: Secondary | ICD-10-CM | POA: Diagnosis present

## 2019-06-01 DIAGNOSIS — Z86711 Personal history of pulmonary embolism: Secondary | ICD-10-CM | POA: Diagnosis not present

## 2019-06-01 DIAGNOSIS — Z86718 Personal history of other venous thrombosis and embolism: Secondary | ICD-10-CM | POA: Diagnosis not present

## 2019-06-01 LAB — CREATININE, SERUM
Creatinine, Ser: 1.38 mg/dL — ABNORMAL HIGH (ref 0.61–1.24)
GFR calc Af Amer: >60 mL/min (ref >60)
GFR calc non Af Amer: 56 mL/min — ABNORMAL LOW (ref >60)

## 2019-06-01 LAB — CBC
HCT: 35.9 % — ABNORMAL LOW (ref 39.0–52.0)
Hemoglobin: 11.5 g/dL — ABNORMAL LOW (ref 13.0–17.0)
MCH: 23.1 pg — ABNORMAL LOW (ref 26.0–34.0)
MCHC: 32 g/dL (ref 30.0–36.0)
MCV: 72.1 fL — ABNORMAL LOW (ref 80.0–100.0)
Platelets: 181 10*3/uL (ref 150–400)
RBC: 4.98 MIL/uL (ref 4.22–5.81)
RDW: 14.5 % (ref 11.5–15.5)
WBC: 6.6 10*3/uL (ref 4.0–10.5)
nRBC: 0 % (ref 0.0–0.2)

## 2019-06-01 LAB — RAPID URINE DRUG SCREEN, HOSP PERFORMED
Amphetamines: NOT DETECTED
Barbiturates: NOT DETECTED
Benzodiazepines: POSITIVE — AB
Cocaine: NOT DETECTED
Opiates: NOT DETECTED
Tetrahydrocannabinol: NOT DETECTED

## 2019-06-01 LAB — MRSA PCR SCREENING: MRSA by PCR: NEGATIVE

## 2019-06-01 LAB — RESPIRATORY PANEL BY RT PCR (FLU A&B, COVID)
Influenza A by PCR: NEGATIVE
Influenza B by PCR: NEGATIVE
SARS Coronavirus 2 by RT PCR: NEGATIVE

## 2019-06-01 LAB — HIV ANTIBODY (ROUTINE TESTING W REFLEX): HIV Screen 4th Generation wRfx: NONREACTIVE

## 2019-06-01 MED ORDER — LOSARTAN POTASSIUM 25 MG PO TABS
25.0000 mg | ORAL_TABLET | Freq: Every day | ORAL | Status: DC
  Administered 2019-06-01 – 2019-06-02 (×2): 25 mg via ORAL
  Filled 2019-06-01 (×2): qty 1

## 2019-06-01 MED ORDER — LACTATED RINGERS IV SOLN
INTRAVENOUS | Status: DC
Start: 2019-06-01 — End: 2019-06-01

## 2019-06-01 MED ORDER — OXYCODONE-ACETAMINOPHEN 5-325 MG PO TABS
1.0000 | ORAL_TABLET | Freq: Four times a day (QID) | ORAL | Status: DC | PRN
  Administered 2019-06-01 – 2019-06-02 (×3): 1 via ORAL
  Filled 2019-06-01 (×3): qty 1

## 2019-06-01 MED ORDER — LACTATED RINGERS IV BOLUS
1000.0000 mL | Freq: Once | INTRAVENOUS | Status: AC
  Administered 2019-06-01: 1000 mL via INTRAVENOUS

## 2019-06-01 MED ORDER — ATORVASTATIN CALCIUM 10 MG PO TABS
10.0000 mg | ORAL_TABLET | Freq: Every evening | ORAL | Status: DC
  Administered 2019-06-01: 10 mg via ORAL
  Filled 2019-06-01: qty 1

## 2019-06-01 MED ORDER — APIXABAN 5 MG PO TABS
5.0000 mg | ORAL_TABLET | Freq: Two times a day (BID) | ORAL | Status: DC
  Administered 2019-06-01 – 2019-06-02 (×3): 5 mg via ORAL
  Filled 2019-06-01 (×3): qty 1

## 2019-06-01 MED ORDER — CHLORHEXIDINE GLUCONATE CLOTH 2 % EX PADS
6.0000 | MEDICATED_PAD | Freq: Every day | CUTANEOUS | Status: DC
  Administered 2019-06-01: 6 via TOPICAL

## 2019-06-01 MED ORDER — HYDRALAZINE HCL 20 MG/ML IJ SOLN: 5.0000 mg | INTRAMUSCULAR | Status: DC | PRN

## 2019-06-01 MED ORDER — DILTIAZEM HCL ER COATED BEADS 240 MG PO CP24
240.0000 mg | ORAL_CAPSULE | Freq: Every day | ORAL | Status: DC
  Administered 2019-06-01 – 2019-06-02 (×2): 240 mg via ORAL
  Filled 2019-06-01 (×2): qty 1

## 2019-06-01 NOTE — H&P (Signed)
NAME:  Garrett Ferrell, MRN:  [id number], DOB:  10/24/1960, LOS: 0 ADMISSION DATE:  05/31/2019, CONSULTATION DATE:  06/01/19 REFERRING MD: Jeraldine Loots  , CHIEF COMPLAINT:  Overdose   Brief History   58 y.o. M who presented with lethargy after taking extra Zanaflex for insomnia, ICU asked to admit as he was requiring peripheral Levophed  History of present illness   58 y.o. M with PMH of bipolar disorder, depression, Afib on diltiazem and Xarelto who couldn't sleep, so took extra 8mg  Zanaflex for insomnia.  After wife had difficulty waking him, was brought to the ED where he was initially lethargic, hypotensive with SBP in the 60's and HR in the 50's.  He was alert on arrival and was given 4L IVF with some improvement in BP, but required peripheral levophed to maintain MAP of 65. Labs otherwise unremarkable except for mildly elevated ETOH level of 19, pt drinks 1 beer occasionally but denies significant use.  Poison control was contacted and recommended levophed aggressive fluid resuscitation and monitoring so PCCM consulted for admission.  Pt was alert and interactive on exam, complained of hunger and some light-headedness, but otherwise feels fine.  Denies SI  Past Medical History   has a past medical history of Anemia, Anxiety, Arthritis, Atrial fibrillation (HCC), Bipolar disorder (HCC), Depression, DVT (deep venous thrombosis) (HCC) (2016), Dyspnea, Dysrhythmia, Gout, Head injury, Headache, History of hiatal hernia, History of shingles (2010), Hyperlipidemia, Hyperlipidemia, Hypertension, Insomnia, Joint pain, Joint swelling, Memory impairment, Nerve damage, Nocturia, Pulmonary embolism (HCC) (2016), Schizophrenia (HCC), and Seizures (HCC).   Significant Hospital Events   12/27 Presented to ED and admit to ICU  Consults:    Procedures:    Significant Diagnostic Tests:    Micro Data:  12/28 Sars-CoV-2 and influenza>>negative  Antimicrobials:     Interim history/subjective:  Given 4L IVF  in the ED, attempted to wean levophed, however could not maintain MAP of 65  Objective   Blood pressure (!) 99/57, pulse (!) 51, temperature 98.3 F (36.8 C), temperature source Oral, resp. rate 17, height 6' (1.829 m), weight 106.6 kg, SpO2 99 %.       No intake or output data in the 24 hours ending 06/01/19 0251 Filed Weights   05/31/19 2159  Weight: 106.6 kg    General:  Well nourished M in no distress HEENT: MM pink/moist Neuro: alert and oriented x4, no focal deficits CV: s1s2 bradycardic, regular, no m/r/g PULM:  CTAB GI: soft, bsx4 active  Extremities: warm/dry, no edema  Skin: no rashes or lesions   Resolved Hospital Problem list     Assessment & Plan:   Unintentional Zanaflex overdose  -received 4L IVF in the ED, poison control recommended aggressive fluid resuscitation and levophed  P: -continue peripheral levophed to maintain MAP of 65, continue LR @ 125cc/hr -UDS positive for Benzos, normal salicylate and tylenol level -Hold home narcotics (chronic pain in the R ankle from old surgery), neurontin and other sedating medications   History of Atrial Fibrillation -bradycardic, hold home diltiazem, continue Eliquis    Bipolar Disorder, Depression -Denies SI related to OD P: -holding risperdal, seroquel and trazodone given hypotension on admission   Best practice:  Diet: regular Pain/Anxiety/Delirium protocol (if indicated): none VAP protocol (if indicated): n/a DVT prophylaxis: eliquis GI prophylaxis: n/a Glucose control: n/a Mobility: as tolerated Code Status: full Family Communication: pt will communicate Disposition: ICU  Labs   CBC: Recent Labs  Lab 05/31/19 2209  WBC 4.6  NEUTROABS 2.5  HGB  10.3*  HCT 31.4*  MCV 71.9*  PLT 175    Basic Metabolic Panel: Recent Labs  Lab 05/31/19 2209  NA 138  K 3.8  CL 107  CO2 20*  GLUCOSE 104*  BUN 15  CREATININE 1.61*  CALCIUM 8.3*   GFR: Estimated Creatinine Clearance: 63.9 mL/min (A)  (by C-G formula based on SCr of 1.61 mg/dL (H)). Recent Labs  Lab 05/31/19 2209  WBC 4.6    Liver Function Tests: Recent Labs  Lab 05/31/19 2209  AST 19  ALT 17  ALKPHOS 38  BILITOT 0.2*  PROT 5.6*  ALBUMIN 2.9*   No results for input(s): LIPASE, AMYLASE in the last 168 hours. No results for input(s): AMMONIA in the last 168 hours.  ABG    Component Value Date/Time   TCO2 17 (L) 05/29/2017 1158     Coagulation Profile: No results for input(s): INR, PROTIME in the last 168 hours.  Cardiac Enzymes: No results for input(s): CKTOTAL, CKMB, CKMBINDEX, TROPONINI in the last 168 hours.  HbA1C: No results found for: HGBA1C  CBG: No results for input(s): GLUCAP in the last 168 hours.  Review of Systems:   Negative except as noted on HPI  Past Medical History  He,  has a past medical history of Anemia, Anxiety, Arthritis, Atrial fibrillation (HCC), Bipolar disorder (HCC), Depression, DVT (deep venous thrombosis) (HCC) (2016), Dyspnea, Dysrhythmia, Gout, Head injury, Headache, History of hiatal hernia, History of shingles (2010), Hyperlipidemia, Hyperlipidemia, Hypertension, Insomnia, Joint pain, Joint swelling, Memory impairment, Nerve damage, Nocturia, Pulmonary embolism (HCC) (2016), Schizophrenia (HCC), and Seizures (HCC).   Surgical History    Past Surgical History:  Procedure Laterality Date  . ANKLE ARTHROSCOPY Right 04/20/2016   Procedure: ANKLE ARTHROSCOPY;  Surgeon: Nadara Mustard, MD;  Location[facility] OR;  Service: Orthopedics;  Laterality: Right;  . ANKLE ARTHROSCOPY WITH FUSION Right 10/05/2016   Procedure: RIGHT ANKLE ARTHROSCOPIC FUSION;  Surgeon: Nadara Mustard, MD;  Location[facility] OR;  Service: Orthopedics;  Laterality: Right;  . ANKLE FUSION Right 04/20/2016   Procedure: Right Posterior Arthroscopic Subtalar Arthrodesis;  Surgeon: Nadara Mustard, MD;  Location: [facility] OR;  Service: Orthopedics;  Laterality: Right;  . ANKLE FUSION Right 04/16/2018   Procedure:  REVISION ANKLE FUSION, OPEN, ANTERIOR APPROACH;  Surgeon: Nadara Mustard, MD;  Location: [facility] OR;  Service: Orthopedics;  Laterality: Right;  . ANKLE SURGERY Right 04/16/2018   REVISION ANKLE FUSION, OPEN, ANTERIOR APPROACH  . CARDIAC CATHETERIZATION Right 2016   at [facility] during his DVT/PE  . COLONOSCOPY WITH PROPOFOL N/A 11/27/2016   Procedure: COLONOSCOPY WITH PROPOFOL;  Surgeon: Charolett Bumpers, MD;  Location: [facility] ENDOSCOPY;  Service: Endoscopy;  Laterality: N/A;  . HERNIA REPAIR     1986-umbilical  . RIGHT HEART CATH  2016   at [facility] during his DVT/PE     Social History   reports that he has never smoked. He has never used smokeless tobacco. He reports current alcohol use. He reports previous drug use.   Family History   His family history includes Heart Problems in his mother; Heart attack in his father.   Allergies No Known Allergies   Home Medications  Prior to Admission medications   Medication Sig Start Date End Date Taking? Authorizing Provider  allopurinol (ZYLOPRIM) 100 MG tablet Take 100 mg by mouth daily. 12/22/15   [provider]  ALPRAZolam Prudy Feeler) 0.5 MG tablet Take 0.5 mg by mouth every 8 (eight) hours  as needed for anxiety.  10/21/15   [provider]  atorvastatin (LIPITOR) 10 MG tablet Take 10 mg by mouth every evening.     [provider]  diltiazem (CARDIZEM CD) 240 MG 24 hr capsule Take 1 capsule (240 mg total) by mouth daily. 08/09/16   Joseph ArtVann, Jessica U, DO  ELIQUIS 5 MG TABS tablet TAKE 1 TABLET BY MOUTH TWICE A DAY 12/19/18   Runell GessBerry, Jonathan J, MD  ferrous sulfate 325 (65 FE) MG EC tablet Take 325 mg by mouth daily with breakfast.    [provider]  gabapentin (NEURONTIN) 300 MG capsule Take 1 capsule (300 mg total) by mouth 2 (two) times daily. 08/20/17   Maxie BarbBhandari, Dron Prasad, MD  losartan (COZAAR) 25 MG tablet Take 25 mg by mouth daily. 09/24/16   [provider]  meloxicam (MOBIC) 15 MG tablet Take 15 mg by mouth daily. 07/29/17   [provider]  methocarbamol (ROBAXIN) 750 MG tablet Take 1 tablet (750 mg total) by mouth every 8 (eight) hours as needed for muscle spasms. 08/20/17   Maxie BarbBhandari, Dron Prasad, MD  Multiple Vitamin (MULTIVITAMIN WITH MINERALS) TABS tablet Take 1 tablet by mouth daily. 08/21/17   Maxie BarbBhandari, Dron Prasad, MD  oxyCODONE-acetaminophen (PERCOCET/ROXICET) 5-325 MG tablet Take 1 tablet by mouth every 4 (four) hours as needed for severe pain. 11/27/18   Nadara Mustarduda, Marcus V, MD  oxyCODONE-acetaminophen (PERCOCET/ROXICET) 5-325 MG tablet Take 1 tablet by mouth every 8 (eight) hours as needed for moderate pain or severe pain. 12/25/18   Nadara Mustarduda, Marcus V, MD  QUEtiapine (SEROQUEL XR) 200 MG 24 hr tablet Take 200 mg by mouth at bedtime. 03/19/19   [provider]  QUEtiapine (SEROQUEL XR) 50 MG TB24 24 hr tablet Take 150 mg by mouth at bedtime.     [provider]  risperiDONE (RISPERDAL) 1 MG tablet Take 1-2 tablets (1-2 mg total) by mouth 2 (two) times daily. Take 1tab (1mg ) in morning and 2 tabs (2mg )at night Patient taking differently: Take 1-2 mg by mouth See admin instructions. Take 1 mg by mouth in morning and take 2 mg by mouth at bedtime 06/01/17   Zannie CoveJoseph, Preetha, MD  tiZANidine (ZANAFLEX) 2 MG tablet Take 2-4 mg by mouth every 8 (eight) hours as needed for muscle spasms.  07/27/17   [provider]  traZODone (DESYREL) 150 MG tablet Take 150 mg by mouth at bedtime. 03/23/19   [provider]  trazodone (DESYREL) 300 MG tablet Take 300 mg by mouth at bedtime. 10/24/15   [provider]  VIAGRA 100 MG tablet Take 100 mg by mouth daily as needed for erectile dysfunction.  09/30/15   [provider]  zolpidem (AMBIEN) 10 MG tablet Take 1 tablet (10 mg total) by mouth at bedtime as needed for sleep. Patient taking differently: Take 10 mg by mouth at bedtime.  06/01/17   Zannie CoveJoseph, Preetha, MD       Critical care time: 50 minutes    CRITICAL CARE Performed by: Darcella GasmanLaura R Jacoby Zanni   Total critical care time: 50 minutes  Critical care time was exclusive of separately billable procedures and treating other patients.  Critical care was necessary to treat or prevent imminent or life-threatening deterioration.  Critical care was time spent personally by me on the following activities: development of treatment plan with patient and/or surrogate as well as nursing, discussions with consultants, evaluation of patient's response to treatment, examination of patient, obtaining history from patient or surrogate,  ordering and performing treatments and interventions, ordering and review of laboratory studies, ordering and review of radiographic studies, pulse oximetry and re-evaluation of patient's condition.   Otilio Carpen Shankar Silber, PA-C [facility]  Pager# [phone], if no answer [phone]

## 2019-06-01 NOTE — Discharge Summary (Addendum)
Physician Discharge Summary  Patient ID: Garrett Ferrell MRN: [id number] DOB/AGE: 10/07/1960 58 y.o.  Admit date: 05/31/2019 Discharge date: 06/02/2019  Problem List Active Problems:   Overdose   Bradycardia   Hypotension  HPI: 58 y.o. M with PMH of bipolar disorder, depression, Afib on diltiazem and Xarelto who couldn't sleep, so took extra 52m Zanaflex for insomnia.  After wife had difficulty waking him, he was brought to the ED where he was initially lethargic, hypotensive with SBP in the 60's and HR in the 50's.  He was alert on arrival and was given 4L IVF with some improvement in BP, but required peripheral levophed to maintain MAP of 65. Labs otherwise unremarkable except for mildly elevated ETOH level of 19, pt drinks 1 beer occasionally but denies significant use.  UDS positive for benzodiazepines. Poison control was contacted and recommended levophed, aggressive fluid resuscitation and monitoring so PCCM consulted for admission.  Pt was alert and interactive on initial exam, complained of hunger and some light-headedness, but otherwise feels fine.  Denied SI.   Hospital Course: The patient was admitted to ICU for unintentional Zanaflex overdose with acute hypotension.  IV fluids were continued.  He required low-dose pressor support which was quickly weaned.  Due to bradycardia AV nodal blocking agents for A. fib were held. All outpatient bipolar/depression medications also held due to hypotension.  With these interventions the patient improved.  He does continue to have poor sleep however he feels much better and is ready to go home and met maximum benefit from his hospital stay.  He had episodic hypertension that was treated with hydralazine.  Home meds have been resumed. Patient can call for follow-up appointment with his primary care provider within 1 week.  I have made a follow-up appointment with his psychiatrist Dr HRosine Door12/30/2020 at 1pm.  Patient instructed not to take additional  Zanaflex to help with sleeping or other medications.    Labs at discharge Lab Results  Component Value Date   CREATININE 1.00 06/02/2019   BUN 10 06/02/2019   NA 138 06/02/2019   K 3.7 06/02/2019   CL 106 06/02/2019   CO2 22 06/02/2019   Lab Results  Component Value Date   WBC 7.6 06/02/2019   HGB 11.6 (L) 06/02/2019   HCT 35.7 (L) 06/02/2019   MCV 71.3 (L) 06/02/2019   PLT 169 06/02/2019   Lab Results  Component Value Date   ALT 17 05/31/2019   AST 19 05/31/2019   ALKPHOS 38 05/31/2019   BILITOT 0.2 (L) 05/31/2019   Lab Results  Component Value Date   INR 1.02 04/16/2018   INR 1.26 08/17/2017   INR 1.47 05/29/2017    Current radiology studies DG Chest Portable 1 View  Result Date: 05/31/2019 CLINICAL DATA:  Overdose EXAM: PORTABLE CHEST 1 VIEW COMPARISON:  Oct 03, 2016 FINDINGS: The heart size and mediastinal contours is borderline enlarged. Both lungs are clear. The visualized skeletal structures are unremarkable. IMPRESSION: No active disease. Electronically Signed   By: BPrudencio PairM.D.   On: 05/31/2019 23:14    Disposition:  Discharge disposition: 01-Home or Self Care      Discharge Instructions    Call MD for:  persistant dizziness or light-headedness   Complete by: As directed    Diet - low sodium heart healthy   Complete by: As directed    Increase activity slowly   Complete by: As directed      Allergies as of 06/02/2019   No Known  Allergies     Medication List    STOP taking these medications   tiZANidine 2 MG tablet Commonly known as: ZANAFLEX     TAKE these medications   allopurinol 100 MG tablet Commonly known as: ZYLOPRIM Take 100 mg by mouth daily.   ALPRAZolam 0.5 MG tablet Commonly known as: XANAX Take 0.5 mg by mouth every 8 (eight) hours as needed for anxiety.   atorvastatin 10 MG tablet Commonly known as: LIPITOR Take 10 mg by mouth every evening.   diltiazem 240 MG 24 hr capsule Commonly known as: CARDIZEM  CD Take 1 capsule (240 mg total) by mouth daily.   Eliquis 5 MG Tabs tablet Generic drug: apixaban TAKE 1 TABLET BY MOUTH TWICE A DAY   ferrous sulfate 325 (65 FE) MG EC tablet Take 325 mg by mouth daily with breakfast.   gabapentin 300 MG capsule Commonly known as: NEURONTIN Take 1 capsule (300 mg total) by mouth 2 (two) times daily.   losartan 25 MG tablet Commonly known as: COZAAR Take 25 mg by mouth daily.   meloxicam 15 MG tablet Commonly known as: MOBIC Take 15 mg by mouth daily.   methocarbamol 750 MG tablet Commonly known as: ROBAXIN Take 1 tablet (750 mg total) by mouth every 8 (eight) hours as needed for muscle spasms.   multivitamin with minerals Tabs tablet Take 1 tablet by mouth daily.   oxyCODONE-acetaminophen 5-325 MG tablet Commonly known as: PERCOCET/ROXICET Take 1 tablet by mouth every 4 (four) hours as needed for severe pain.   oxyCODONE-acetaminophen 5-325 MG tablet Commonly known as: PERCOCET/ROXICET Take 1 tablet by mouth every 8 (eight) hours as needed for moderate pain or severe pain.   QUEtiapine 50 MG Tb24 24 hr tablet Commonly known as: SEROQUEL XR Take 150 mg by mouth at bedtime.   QUEtiapine 200 MG 24 hr tablet Commonly known as: SEROQUEL XR Take 200 mg by mouth at bedtime.   risperiDONE 1 MG tablet Commonly known as: RISPERDAL Take 1-2 tablets (1-2 mg total) by mouth 2 (two) times daily. Take 1tab (48m) in morning and 2 tabs (234mat night What changed:   when to take this  additional instructions   trazodone 300 MG tablet Commonly known as: DESYREL Take 300 mg by mouth at bedtime.   traZODone 150 MG tablet Commonly known as: DESYREL Take 150 mg by mouth at bedtime.   Viagra 100 MG tablet Generic drug: sildenafil Take 100 mg by mouth daily as needed for erectile dysfunction.   zolpidem 10 MG tablet Commonly known as: AMBIEN Take 1 tablet (10 mg total) by mouth at bedtime as needed for sleep. What changed: when to take  this      Follow-up Information    Osei-Bonsu, GeIona BeardMD. Schedule an appointment as soon as possible for a visit in 1 week(s).   Specialty: Internal Medicine Contact information: [address] [phone]      HeBernita RaisinMD. GoDaphane Shepherdn 06/03/2019.   Why: you have an appointment at 1pm with Dr HeRosine DoorPlease call [phone] if you need to change or cancel Contact information: [phone]            Discharged Condition: good  Time spent on discharge greater than 40 minutes.  Vital signs at Discharge. Temp:  [98 F (36.7 C)-98.9 F (37.2 C)] 98.9 F (37.2 C) (12/29 0404) Pulse Rate:  [67-99] 67 (12/29 0404) Resp:  [14-29]  20 (12/29 0404) BP: (96-157)/(64-103) 141/97 (12/29 0404) SpO2:  [92 %-100 %] 98 % (12/29 0404) Weight:  [163.8 kg] 116.2 kg (12/29 0404)  Follow-up as noted above.  Please do not take additional Zanaflex to help with sleeping.   Signed: Francine Graven, MSN, [facility]  Attending note: I have seen and examined the patient. History, labs and imaging reviewed.  Pt admitted with zanaflex overdose requiring brief ICU stay and pressors. No suicidal ideation. Now improved back to baseline.  Stable for discharge  Marshell Garfinkel MD [facility] 06/02/2019, 2:11 PM

## 2019-06-01 NOTE — Progress Notes (Signed)
Updated poison control on patient's status. No further recommendations. They stated they would sign off for now as patient has stabilized.  Joellen Jersey, RN

## 2019-06-01 NOTE — Progress Notes (Signed)
eLink Physician-Brief Progress Note Patient Name: Garrett Ferrell DOB: Aug 08, 1960 MRN: [id number]   Date of Service  06/01/2019  HPI/Events of Note  58 year old man with zanaflex OD. ON low dose levophed and SBP 138, HR 59. Awake and alert  eICU Interventions  Bedside ICU team notified Plz call if needed     Intervention Category Major Interventions: Hypotension - evaluation and management Evaluation Type: New Patient Evaluation  Margaretmary Lombard 06/01/2019, 6:41 AM

## 2019-06-01 NOTE — Progress Notes (Signed)
[facility] Progress Note Patient Name: Garrett Ferrell DOB: 26-Jun-1960 MRN: [id number]   Date of Service  06/01/2019  HPI/Events of Note  Elevated blood pressure,  eICU Interventions  PRN Hydralazine 5 mg iv Q 4 hours for SBP > 150 mmHg        Garrett Ferrell U Garrett Ferrell 06/01/2019, 11:25 PM

## 2019-06-01 NOTE — ED Notes (Signed)
Breakfast Ordered 

## 2019-06-01 NOTE — Plan of Care (Signed)
  Problem: Clinical Measurements: Goal: Ability to maintain clinical measurements within normal limits will improve Outcome: Progressing Goal: Cardiovascular complication will be avoided Outcome: Progressing   Problem: Elimination: Goal: Will not experience complications related to urinary retention Outcome: Progressing   

## 2019-06-02 LAB — CBC
HCT: 35.7 % — ABNORMAL LOW (ref 39.0–52.0)
Hemoglobin: 11.6 g/dL — ABNORMAL LOW (ref 13.0–17.0)
MCH: 23.2 pg — ABNORMAL LOW (ref 26.0–34.0)
MCHC: 32.5 g/dL (ref 30.0–36.0)
MCV: 71.3 fL — ABNORMAL LOW (ref 80.0–100.0)
Platelets: 169 10*3/uL (ref 150–400)
RBC: 5.01 MIL/uL (ref 4.22–5.81)
RDW: 14.3 % (ref 11.5–15.5)
WBC: 7.6 10*3/uL (ref 4.0–10.5)
nRBC: 0 % (ref 0.0–0.2)

## 2019-06-02 LAB — PHOSPHORUS: Phosphorus: 3.1 mg/dL (ref 2.5–4.6)

## 2019-06-02 LAB — BASIC METABOLIC PANEL
Anion gap: 10 (ref 5–15)
BUN: 10 mg/dL (ref 6–20)
CO2: 22 mmol/L (ref 22–32)
Calcium: 9 mg/dL (ref 8.9–10.3)
Chloride: 106 mmol/L (ref 98–111)
Creatinine, Ser: 1 mg/dL (ref 0.61–1.24)
GFR calc Af Amer: >60 mL/min (ref >60)
GFR calc non Af Amer: >60 mL/min (ref >60)
Glucose, Bld: 122 mg/dL — ABNORMAL HIGH (ref 70–99)
Potassium: 3.7 mmol/L (ref 3.5–5.1)
Sodium: 138 mmol/L (ref 135–145)

## 2019-06-02 LAB — MAGNESIUM: Magnesium: 1.3 mg/dL — ABNORMAL LOW (ref 1.7–2.4)

## 2019-06-02 MED ORDER — ALPRAZOLAM 0.5 MG PO TABS
0.5000 mg | ORAL_TABLET | Freq: Once | ORAL | Status: AC
  Administered 2019-06-02: 0.5 mg via ORAL
  Filled 2019-06-02: qty 1

## 2019-06-02 MED ORDER — RISPERIDONE 1 MG PO TBDP
1.0000 mg | ORAL_TABLET | Freq: Two times a day (BID) | ORAL | Status: DC
  Administered 2019-06-02: 1 mg via ORAL
  Filled 2019-06-02 (×3): qty 1

## 2019-06-02 MED ORDER — GABAPENTIN 300 MG PO CAPS
300.0000 mg | ORAL_CAPSULE | Freq: Two times a day (BID) | ORAL | Status: DC
  Administered 2019-06-02: 300 mg via ORAL
  Filled 2019-06-02: qty 1

## 2019-06-02 NOTE — Plan of Care (Signed)
  Problem: Education: Goal: Knowledge of General Education information will improve Description: Including pain rating scale, medication(s)/side effects and non-pharmacologic comfort measures Outcome: Progressing   Problem: Health Behavior/Discharge Planning: Goal: Ability to manage health-related needs will improve Outcome: Progressing   Problem: Clinical Measurements: Goal: Ability to maintain clinical measurements within normal limits will improve Outcome: Progressing Goal: Will remain free from infection Outcome: Progressing Goal: Diagnostic test results will improve Outcome: Progressing Goal: Respiratory complications will improve Outcome: Progressing Goal: Cardiovascular complication will be avoided Outcome: Progressing   Problem: Activity: Goal: Risk for activity intolerance will decrease Outcome: Progressing   Problem: Coping: Goal: Level of anxiety will decrease Outcome: Progressing   Problem: Pain Managment: Goal: General experience of comfort will improve Outcome: Progressing   Problem: Safety: Goal: Ability to remain free from injury will improve Outcome: Progressing   Elesa Hacker, RN

## 2019-06-02 NOTE — Progress Notes (Signed)
Pt is very anxious and having tremors. Talking to his wife on phone. I spoke with his wife as well, who expressed concern over his anxiety considering underlying psych history. MD notifed of pt's and his wife's concerns over restarting his home meds.

## 2019-06-02 NOTE — Progress Notes (Signed)
Pt's home meds ordered and given to pt. Finished eating breakfast. Sitting on side of bed. NAD. No needs expressed at this time.

## 2019-06-02 NOTE — Plan of Care (Signed)

## 2019-06-10 ENCOUNTER — Other Ambulatory Visit: Payer: Self-pay

## 2019-06-10 ENCOUNTER — Encounter (HOSPITAL_COMMUNITY): Payer: Self-pay

## 2019-06-10 ENCOUNTER — Emergency Department (HOSPITAL_COMMUNITY)
Admission: EM | Admit: 2019-06-10 | Discharge: 2019-06-11 | Disposition: A | Discharge status: Home / Self Care | Payer: Medicaid Other | Attending: Emergency Medicine | Admitting: Emergency Medicine

## 2019-06-10 DIAGNOSIS — R410 Disorientation, unspecified: Secondary | ICD-10-CM | POA: Insufficient documentation

## 2019-06-10 DIAGNOSIS — Z5321 Procedure and treatment not carried out due to patient leaving prior to being seen by health care provider: Secondary | ICD-10-CM | POA: Insufficient documentation

## 2019-06-10 LAB — URINALYSIS, ROUTINE W REFLEX MICROSCOPIC
Bilirubin Urine: NEGATIVE
Glucose, UA: NEGATIVE mg/dL
Hgb urine dipstick: NEGATIVE
Ketones, ur: 20 mg/dL — AB
Leukocytes,Ua: NEGATIVE
Nitrite: NEGATIVE
Protein, ur: 100 mg/dL — AB
Specific Gravity, Urine: 1.021 (ref 1.005–1.030)
pH: 5 (ref 5.0–8.0)

## 2019-06-10 MED ORDER — SODIUM CHLORIDE 0.9% FLUSH: 3.0000 mL | Freq: Once | INTRAVENOUS | Status: DC

## 2019-06-10 NOTE — ED Triage Notes (Signed)
To triage via EMS.  Pts wife reported to EMS that pt had a 5 minute episode of disorientation/confusion with hypotension prior to EMS arriving on scene.  Pt unaware of events.   Pt snorted cocaine fort the past 3 days.   EMS BP 144/70 HR 112 irregular RR 18 SpO2  99%  T 96.8

## 2019-06-11 LAB — BASIC METABOLIC PANEL
Anion gap: 12 (ref 5–15)
BUN: 15 mg/dL (ref 6–20)
CO2: 25 mmol/L (ref 22–32)
Calcium: 9.8 mg/dL (ref 8.9–10.3)
Chloride: 101 mmol/L (ref 98–111)
Creatinine, Ser: 1.58 mg/dL — ABNORMAL HIGH (ref 0.61–1.24)
GFR calc Af Amer: 55 mL/min — ABNORMAL LOW (ref >60)
GFR calc non Af Amer: 48 mL/min — ABNORMAL LOW (ref >60)
Glucose, Bld: 111 mg/dL — ABNORMAL HIGH (ref 70–99)
Potassium: 4.1 mmol/L (ref 3.5–5.1)
Sodium: 138 mmol/L (ref 135–145)

## 2019-06-11 LAB — CBC
HCT: 35.6 % — ABNORMAL LOW (ref 39.0–52.0)
Hemoglobin: 11.5 g/dL — ABNORMAL LOW (ref 13.0–17.0)
MCH: 23.3 pg — ABNORMAL LOW (ref 26.0–34.0)
MCHC: 32.3 g/dL (ref 30.0–36.0)
MCV: 72.1 fL — ABNORMAL LOW (ref 80.0–100.0)
Platelets: 215 10*3/uL (ref 150–400)
RBC: 4.94 MIL/uL (ref 4.22–5.81)
RDW: 14.9 % (ref 11.5–15.5)
WBC: 7.9 10*3/uL (ref 4.0–10.5)
nRBC: 0 % (ref 0.0–0.2)

## 2019-06-11 NOTE — ED Notes (Signed)
Pt left AMA. Pt was encouraged to stay and was aware of the risks. Pt stated they made an appointment with their doctor this coming week.

## 2019-08-06 ENCOUNTER — Ambulatory Visit: Payer: Self-pay

## 2019-08-06 ENCOUNTER — Other Ambulatory Visit: Payer: Self-pay

## 2019-08-06 ENCOUNTER — Encounter: Payer: Self-pay | Admitting: Orthopedic Surgery

## 2019-08-06 ENCOUNTER — Ambulatory Visit (INDEPENDENT_AMBULATORY_CARE_PROVIDER_SITE_OTHER): Payer: Medicaid Other | Admitting: Orthopedic Surgery

## 2019-08-06 VITALS — Ht 72.0 in | Wt 256.0 lb

## 2019-08-06 DIAGNOSIS — Z981 Arthrodesis status: Secondary | ICD-10-CM

## 2019-08-06 DIAGNOSIS — M25571 Pain in right ankle and joints of right foot: Secondary | ICD-10-CM | POA: Diagnosis not present

## 2019-08-06 NOTE — Progress Notes (Signed)
Office Visit Note   Patient: Garrett Ferrell           Date of Birth: [birth year]           MRN: [id number] Visit Date: 08/06/2019              Requested by: Jackie Plum, MD [address] [place],  [place] [address] PCP: Jackie Plum, MD  Chief Complaint  Patient presents with  . Right Ankle - Follow-up    04/16/18 right ankle  revision fusion       HPI: Patient is a 59 year old gentleman who presents in follow-up status post tibial calcaneal fusion who states he has pain over his anterior tibial tendon.  Patient states he has pain all the time.  Assessment & Plan: Visit Diagnoses:  1. Status post ankle fusion   2. Pain in right ankle and joints of right foot     Plan: We will place him in a 716 change heel lift and place his foot in a fracture boot to unload stress on the anterior tibial tendon recommended Voltaren gel up to 3 times a day.  As he feels better he can use the heel lift in his shoe.  Follow-Up Instructions: Return if symptoms worsen or fail to improve.   Ortho Exam  Patient is alert, oriented, no adenopathy, well-dressed, normal affect, normal respiratory effort. Examination patient has a stable fusion with his ankle at 90 degrees.  He is tender to palpation along the anterior tibial tendon there is no redness no cellulitis no palpable defect to the tendon.  Patient has no swelling in the foot or ankle.  Imaging: No results found. No images are attached to the encounter.  Labs: No results found for: HGBA1C, ESRSEDRATE, CRP, LABURIC, REPTSTATUS, GRAMSTAIN, CULT, LABORGA   Lab Results  Component Value Date   ALBUMIN 2.9 (L) 05/31/2019   ALBUMIN CANCELED 04/24/2018   ALBUMIN 3.3 (L) 04/16/2018    Lab Results  Component Value Date   MG 1.3 (L) 06/02/2019   No results found for: VD25OH  No results found for: PREALBUMIN CBC EXTENDED Latest Ref Rng & Units 06/10/2019 06/02/2019 06/01/2019  WBC 4.0 - 10.5 K/uL 7.9 7.6 6.6  RBC  4.22 - 5.81 MIL/uL 4.94 5.01 4.98  HGB 13.0 - 17.0 g/dL 11.5(L) 11.6(L) 11.5(L)  HCT 39.0 - 52.0 % 35.6(L) 35.7(L) 35.9(L)  PLT 150 - 400 K/uL 215 169 181  NEUTROABS 1.7 - 7.7 K/uL - - -  LYMPHSABS 0.7 - 4.0 K/uL - - -     Body mass index is 34.72 kg/m.  Orders:  Orders Placed This Encounter  Procedures  . XR Ankle Complete Right   No orders of the defined types were placed in this encounter.    Procedures: No procedures performed  Clinical Data: No additional findings.  ROS:  All other systems negative, except as noted in the HPI. Review of Systems  Objective: Vital Signs: Ht 6' (1.829 m)   Wt 256 lb (116.1 kg)   BMI 34.72 kg/m   Specialty Comments:  No specialty comments available.  PMFS History: Patient Active Problem List   Diagnosis Date Noted  . Overdose 06/01/2019  . Bradycardia   . Hypotension   . Status post ankle fusion 04/16/2018  . Nonunion of subtalar arthrodesis   . Nonunion of osteotomy site   . Back pain   . Rhabdomyolysis 08/16/2017  . Syncope 05/29/2017  . Bipolar disorder (HCC) 05/29/2017  . Schizophrenia (HCC)  05/29/2017  . TBI (traumatic brain injury) (HCC) 05/29/2017  . Unwitnessed fall 05/29/2017  . Alcohol abuse with intoxication (HCC) 05/29/2017  . Slurred speech 05/29/2017  . Chest pain 08/07/2016  . Cocaine use 08/07/2016  . Chronic musculoskeletal pain 08/07/2016  . CKD (chronic kidney disease) 08/07/2016  . Depression with anxiety 08/07/2016  . Traumatic arthritis of right ankle 07/31/2016  . 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 Osteoarthritis of right subtalar joint   . Right ankle pain 01/24/2016  . Bilateral hip pain 01/03/2016  . Atrial fibrillation, chronic (HCC) 11/18/2015  . Essential hypertension 11/18/2015  . Hyperlipidemia 11/18/2015  . DVT, bilateral lower limbs (HCC) 11/18/2015  . Pulmonary embolus (HCC) 11/18/2015   Past Medical History:  Diagnosis Date  . Anemia    takes Iron pill daily    . Anxiety    takes Xanax daily as needed  . Arthritis   . Atrial fibrillation (HCC)    takes Eliquis daily. Last dose will be 10/03/16  . Bipolar disorder (HCC)   . Depression    takes Risperdal daily  . DVT (deep venous thrombosis) (HCC) 2016   BLE  . Dyspnea    when laying on back, with extertion  . Dysrhythmia    Afib  . Gout    takes Allopurinol daily  . Head injury   . Headache    occasionally  . History of hiatal hernia   . History of shingles 2010  . Hyperlipidemia    takes Atorvastatin daily  . Hyperlipidemia    takes Atorvastatin daily  . Hypertension    takes Metoprolol daily  . Insomnia    takes Ambien and Trazodone nightly  . Joint pain   . Joint swelling    right ankle  . Memory impairment   . 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 Nocturia   . Pulmonary embolism (HCC) 2016  . Schizophrenia (HCC)   . Seizures (HCC)    last one in 2002    Family History  Problem Relation Age of Onset  . Heart Problems Mother   . Heart attack Father        age 33s    Past Surgical History:  Procedure Laterality Date  . ANKLE ARTHROSCOPY Right 04/20/2016   Procedure: ANKLE ARTHROSCOPY;  Surgeon: Nadara Mustard, MD;  [facility];  Service: Orthopedics;  Laterality: Right;  . ANKLE ARTHROSCOPY WITH FUSION Right 10/05/2016   Procedure: RIGHT ANKLE ARTHROSCOPIC FUSION;  Surgeon: Nadara Mustard, MD;  Location: [facility] OR;  Service: Orthopedics;  Laterality: Right;  . ANKLE FUSION Right 04/20/2016   Procedure: Right Posterior Arthroscopic Subtalar Arthrodesis;  Surgeon: Nadara Mustard, MD;  Location: [facility] OR;  Service: Orthopedics;  Laterality: Right;  . ANKLE FUSION Right 04/16/2018   Procedure: REVISION ANKLE FUSION, OPEN, ANTERIOR APPROACH;  Surgeon: Nadara Mustard, MD;  Location: [facility] OR;  Service: Orthopedics;  Laterality: Right;  . ANKLE SURGERY Right 04/16/2018   REVISION ANKLE FUSION, OPEN, ANTERIOR APPROACH  . CARDIAC CATHETERIZATION Right 2016   at [facility] during his DVT/PE  . COLONOSCOPY WITH PROPOFOL N/A 11/27/2016   Procedure: COLONOSCOPY WITH PROPOFOL;  Surgeon: Charolett Bumpers, MD;  Location: [facility] ENDOSCOPY;  Service: Endoscopy;  Laterality: N/A;  . HERNIA REPAIR     1986-umbilical  . RIGHT HEART CATH  2016   at [facility] during his DVT/PE   Social History   Occupational History  .  Occupation: disabled  Tobacco Use  . Smoking status: Never Smoker  . Smokeless tobacco: Never Used  Substance and Sexual Activity  . Alcohol use: Yes    Comment: varies,  binge drinker - per wife   . Drug use: Yes    Types: Benzodiazepines  . Sexual activity: Not on file

## 2019-08-07 ENCOUNTER — Telehealth: Payer: Self-pay | Admitting: Orthopedic Surgery

## 2019-08-07 NOTE — Telephone Encounter (Signed)
A cortisone shot would not be helpful.  The bone is well fused and stable.

## 2019-08-07 NOTE — Telephone Encounter (Signed)
Patient called.   He was here yesterday and wanted to know if a cortisone injection is a possibility for him.   Call back number: [phone]

## 2019-08-07 NOTE — Telephone Encounter (Signed)
Please see below and advise.

## 2019-08-10 NOTE — Telephone Encounter (Signed)
Called and lm on vm to advise of message below. To call with any questions.  

## 2019-08-19 ENCOUNTER — Encounter: Payer: Self-pay | Admitting: Cardiovascular Disease

## 2019-08-19 ENCOUNTER — Other Ambulatory Visit: Payer: Self-pay

## 2019-08-19 ENCOUNTER — Ambulatory Visit: Payer: Medicaid Other | Admitting: Cardiovascular Disease

## 2019-08-19 DIAGNOSIS — I10 Essential (primary) hypertension: Secondary | ICD-10-CM | POA: Diagnosis not present

## 2019-08-19 DIAGNOSIS — E782 Mixed hyperlipidemia: Secondary | ICD-10-CM | POA: Diagnosis not present

## 2019-08-19 DIAGNOSIS — I482 Chronic atrial fibrillation, unspecified: Secondary | ICD-10-CM

## 2019-08-19 MED ORDER — APIXABAN 5 MG PO TABS: 5.0000 mg | ORAL_TABLET | Freq: Two times a day (BID) | ORAL | 5 refills | Status: AC

## 2019-08-19 MED ORDER — LOSARTAN POTASSIUM 25 MG PO TABS: 25.0000 mg | ORAL_TABLET | Freq: Every day | ORAL | 3 refills | Status: AC

## 2019-08-19 MED ORDER — ATORVASTATIN CALCIUM 10 MG PO TABS: 10.0000 mg | ORAL_TABLET | Freq: Every evening | ORAL | 3 refills | Status: AC

## 2019-08-19 MED ORDER — DILTIAZEM HCL ER COATED BEADS 240 MG PO CP24: 240.0000 mg | ORAL_CAPSULE | Freq: Every day | ORAL | 3 refills | Status: AC

## 2019-08-19 MED ORDER — DILTIAZEM HCL ER COATED BEADS 240 MG PO CP24: 240.0000 mg | ORAL_CAPSULE | Freq: Every day | ORAL | 0 refills | Status: DC

## 2019-08-19 NOTE — Assessment & Plan Note (Signed)
History of essential hypertension blood pressure measured today 122/80.  He is on diltiazem and losartan.

## 2019-08-19 NOTE — Patient Instructions (Signed)
Medication Instructions:  Refill sent to the pharmacy electronically.  *If you need a refill on your cardiac medications before your next appointment, please call your pharmacy*   Lab Work: If you have labs (blood work) drawn today and your tests are completely normal, you will receive your results only by: . MyChart Message (if you have MyChart) OR . A paper copy in the mail If you have any lab test that is abnormal or we need to change your treatment, we will call you to review the results   Follow-Up: At [facility], you and your health needs are our priority.  As part of our continuing mission to provide you with exceptional heart care, we have created designated Provider Care Teams.  These Care Teams include your primary Cardiologist (physician) and Advanced Practice Providers (APPs -  Physician Assistants and Nurse Practitioners) who all work together to provide you with the care you need, when you need it.  We recommend signing up for the patient portal called "MyChart".  Sign up information is provided on this After Visit Summary.  MyChart is used to connect with patients for Virtual Visits (Telemedicine).  Patients are able to view lab/test results, encounter notes, upcoming appointments, etc.  Non-urgent messages can be sent to your provider as well.   To learn more about what you can do with MyChart, go to [email or web address].    Your next appointment:   12 month(s)  The format for your next appointment:   Either In Person or Virtual  Provider:   You may see JONATHAN BERRY MD or one of the following Advanced Practice Providers on your designated Care Team:    Luke Kilroy, PA-C  Callie Goodrich, PA-C  Jesse Cleaver, FNP     

## 2019-08-19 NOTE — Assessment & Plan Note (Signed)
History of hyperlipidemia on statin therapy with lipid profile performed 04/24/2018 revealing total cholesterol 156

## 2019-08-19 NOTE — Assessment & Plan Note (Signed)
History of chronic atrial fibrillation rate controlled on Eliquis oral anticoagulation. ?

## 2019-08-19 NOTE — Progress Notes (Signed)
08/19/2019 Garrett Ferrell   06-01-1961  [id number]  Primary Physician Benito Mccreedy, MD Primary Cardiologist: Lorretta Harp MD Lupe Carney, [place]  HPI:  Garrett Ferrell is a 59 y.o.  mildly overweight married African-American male father of 5 children who was  referred by Dr. Jimmye Norman for cardiovascular evaluation and to be established in our practice.  I last saw him in the office 03/25/2018.Garrett Ferrell  He has a history of chronic A. Fib dating back to 1999 on oral anticoagulation. Other problems included hypertension and hyperlipidemia. He has never had a heart attack or stroke. He probably had bilateral DVTs and a pulmonary embolus in October thousand 16 while living in [place] and underwent EK OS mechanical thrombectomy.  He was switched to Eliquis oral and coagulation at that time.  He had his right ankle surgery by Dr. Sharol Given which which I cleared him for with a negative Myoview stress test.  Since I saw him a year ago he denies chest pain or shortness of breath.    Current Meds  Medication Sig  . allopurinol (ZYLOPRIM) 100 MG tablet Take 100 mg by mouth daily.  Garrett Ferrell atorvastatin (LIPITOR) 10 MG tablet Take 10 mg by mouth every evening.   . diltiazem (CARDIZEM CD) 240 MG 24 hr capsule Take 1 capsule (240 mg total) by mouth daily.  Garrett Ferrell ELIQUIS 5 MG TABS tablet TAKE 1 TABLET BY MOUTH TWICE A DAY  . ferrous sulfate 325 (65 FE) MG EC tablet Take 325 mg by mouth daily with breakfast.  . gabapentin (NEURONTIN) 300 MG capsule Take 1 capsule (300 mg total) by mouth 2 (two) times daily.  Garrett Ferrell losartan (COZAAR) 25 MG tablet Take 25 mg by mouth daily.  . meloxicam (MOBIC) 15 MG tablet Take 15 mg by mouth daily.  . methocarbamol (ROBAXIN) 750 MG tablet Take 1 tablet (750 mg total) by mouth every 8 (eight) hours as needed for muscle spasms.  . Multiple Vitamin (MULTIVITAMIN WITH MINERALS) TABS tablet Take 1 tablet by mouth daily.  . QUEtiapine (SEROQUEL XR) 200 MG 24 hr tablet Take 200  mg by mouth at bedtime.  . risperiDONE (RISPERDAL) 1 MG tablet Take 1-2 tablets (1-2 mg total) by mouth 2 (two) times daily. Take 1tab (1mg ) in morning and 2 tabs (2mg )at night (Patient taking differently: Take 1-2 mg by mouth See admin instructions. Take 1 mg by mouth in morning and take 2 mg by mouth at bedtime)  . traZODone (DESYREL) 150 MG tablet Take 150 mg by mouth at bedtime.  Garrett Ferrell VIAGRA 100 MG tablet Take 100 mg by mouth daily as needed for erectile dysfunction.   . [DISCONTINUED] ALPRAZolam (XANAX) 0.5 MG tablet Take 0.5 mg by mouth every 8 (eight) hours as needed for anxiety.   . [DISCONTINUED] oxyCODONE-acetaminophen (PERCOCET/ROXICET) 5-325 MG tablet Take 1 tablet by mouth every 4 (four) hours as needed for severe pain.  . [DISCONTINUED] oxyCODONE-acetaminophen (PERCOCET/ROXICET) 5-325 MG tablet Take 1 tablet by mouth every 8 (eight) hours as needed for moderate pain or severe pain.  . [DISCONTINUED] QUEtiapine (SEROQUEL XR) 50 MG TB24 24 hr tablet Take 150 mg by mouth at bedtime.   . [DISCONTINUED] trazodone (DESYREL) 300 MG tablet Take 300 mg by mouth at bedtime.  . [DISCONTINUED] zolpidem (AMBIEN) 10 MG tablet Take 1 tablet (10 mg total) by mouth at bedtime as needed for sleep. (Patient taking differently: Take 10 mg by mouth at bedtime. )     No Known Allergies  Social History   Socioeconomic History  . Marital status: Married    Spouse name: Garrett Ferrell  . Number of children: 5  . Years of education: Not on file  . Highest education level: Not on file  Occupational History  . Occupation: disabled  Tobacco Use  . Smoking status: Never Smoker  . Smokeless tobacco: Never Used  Substance and Sexual Activity  . Alcohol use: Yes    Comment: varies,  binge drinker - per wife   . Drug use: Yes    Types: Benzodiazepines  . Sexual activity: Not on file  Other Topics Concern  . Not on file  Social History Narrative  . Not on file   Social Determinants of Health   Financial  Resource Strain:   . Difficulty of Paying Living Expenses:   Food Insecurity:   . Worried About Programme researcher, broadcasting/film/video in the Last Year:   . Barista in the Last Year:   Transportation Needs:   . Freight forwarder (Medical):   Garrett Ferrell Lack of Transportation (Non-Medical):   Physical Activity:   . Days of Exercise per Week:   . Minutes of Exercise per Session:   Stress:   . Feeling of Stress :   Social Connections:   . Frequency of Communication with Friends and Family:   . Frequency of Social Gatherings with Friends and Family:   . Attends Religious Services:   . Active Member of Clubs or Organizations:   . Attends Banker Meetings:   Garrett Ferrell Marital Status:   Intimate Partner Violence:   . Fear of Current or Ex-Partner:   . Emotionally Abused:   Garrett Ferrell Physically Abused:   . Sexually Abused:      Review of Systems: General: negative for chills, fever, night sweats or weight changes.  Cardiovascular: negative for chest pain, dyspnea on exertion, edema, orthopnea, palpitations, paroxysmal nocturnal dyspnea or shortness of breath Dermatological: negative for rash Respiratory: negative for cough or wheezing Urologic: negative for hematuria Abdominal: negative for nausea, vomiting, diarrhea, bright red blood per rectum, melena, or hematemesis Neurologic: negative for visual changes, syncope, or dizziness All other systems reviewed and are otherwise negative except as noted above.    Blood pressure 122/80, pulse (!) 52, height 6' (1.829 m), weight 250 lb (113.4 kg), SpO2 98 %.  General appearance: alert and no distress Neck: no adenopathy, no carotid bruit, no JVD, supple, symmetrical, trachea midline and thyroid not enlarged, symmetric, no tenderness/mass/nodules Lungs: clear to auscultation bilaterally Heart: irregularly irregular rhythm Extremities: extremities normal, atraumatic, no cyanosis or edema Pulses: 2+ and symmetric Skin: Skin color, texture, turgor normal.  No rashes or lesions Neurologic: Alert and oriented X 3, normal strength and tone. Normal symmetric reflexes. Normal coordination and gait  EKG not performed today  ASSESSMENT AND PLAN:   Atrial fibrillation, chronic (HCC) History of chronic atrial fibrillation rate controlled on Eliquis oral anticoagulation.  Essential hypertension History of essential hypertension blood pressure measured today 122/80.  He is on diltiazem and losartan.  Hyperlipidemia History of hyperlipidemia on statin therapy with lipid profile performed 04/24/2018 revealing total cholesterol 156      Runell Gess MD [facility] 08/19/2019 11:48 AM

## 2019-11-03 DEATH — deceased

## 2019-11-25 ENCOUNTER — Institutional Professional Consult (permissible substitution): Payer: Medicaid Other | Admitting: Plastic Surgery

## 2020-08-22 ENCOUNTER — Telehealth: Payer: Self-pay | Admitting: Cardiovascular Disease

## 2020-08-22 NOTE — Telephone Encounter (Signed)
3.21.22 Called cell no. Call could not be completed. LVM message on home phone to sch. 1 yr fu w/Dr Allyson Sabal. LP
